# Patient Record
Sex: Male | Born: 1935 | Race: White | Hispanic: No | Marital: Married | Smoking: Former smoker
Health system: Southern US, Community
[De-identification: ages and names within clinical notes are randomized; demographics above are authoritative.]

## PROBLEM LIST (undated history)

## (undated) DIAGNOSIS — I4891 Unspecified atrial fibrillation: Secondary | ICD-10-CM

## (undated) DIAGNOSIS — I10 Essential (primary) hypertension: Secondary | ICD-10-CM

## (undated) DIAGNOSIS — E079 Disorder of thyroid, unspecified: Secondary | ICD-10-CM

## (undated) DIAGNOSIS — N400 Enlarged prostate without lower urinary tract symptoms: Secondary | ICD-10-CM

## (undated) DIAGNOSIS — F0390 Unspecified dementia without behavioral disturbance: Secondary | ICD-10-CM

## (undated) DIAGNOSIS — M48061 Spinal stenosis, lumbar region without neurogenic claudication: Secondary | ICD-10-CM

## (undated) HISTORY — PX: EYE SURGERY: SHX253

## (undated) HISTORY — PX: OTHER SURGICAL HISTORY: SHX169

## (undated) HISTORY — DX: Spinal stenosis, lumbar region without neurogenic claudication: M48.061

---

## 2001-03-02 ENCOUNTER — Ambulatory Visit (HOSPITAL_COMMUNITY): Admission: RE | Admit: 2001-03-02 | Discharge: 2001-03-02 | Payer: Self-pay | Admitting: Cardiology

## 2001-03-14 ENCOUNTER — Ambulatory Visit (HOSPITAL_COMMUNITY): Admission: RE | Admit: 2001-03-14 | Discharge: 2001-03-14 | Payer: Self-pay | Admitting: Cardiology

## 2001-03-27 ENCOUNTER — Ambulatory Visit (HOSPITAL_COMMUNITY): Admission: RE | Admit: 2001-03-27 | Discharge: 2001-03-27 | Payer: Self-pay | Admitting: Cardiology

## 2001-05-04 ENCOUNTER — Ambulatory Visit (HOSPITAL_COMMUNITY): Admission: RE | Admit: 2001-05-04 | Discharge: 2001-05-04 | Payer: Self-pay | Admitting: *Deleted

## 2001-05-04 ENCOUNTER — Encounter: Payer: Self-pay | Admitting: *Deleted

## 2001-05-18 ENCOUNTER — Ambulatory Visit (HOSPITAL_COMMUNITY): Admission: RE | Admit: 2001-05-18 | Discharge: 2001-05-18 | Payer: Self-pay | Admitting: Cardiology

## 2001-05-18 ENCOUNTER — Encounter: Payer: Self-pay | Admitting: Cardiology

## 2001-06-04 ENCOUNTER — Encounter: Payer: Self-pay | Admitting: *Deleted

## 2001-06-06 ENCOUNTER — Inpatient Hospital Stay (HOSPITAL_COMMUNITY): Admission: RE | Admit: 2001-06-06 | Discharge: 2001-06-07 | Payer: Self-pay | Admitting: *Deleted

## 2001-06-06 ENCOUNTER — Encounter (INDEPENDENT_AMBULATORY_CARE_PROVIDER_SITE_OTHER): Payer: Self-pay | Admitting: Specialist

## 2001-07-20 ENCOUNTER — Ambulatory Visit (HOSPITAL_COMMUNITY): Admission: RE | Admit: 2001-07-20 | Discharge: 2001-07-20 | Payer: Self-pay | Admitting: Cardiology

## 2001-12-24 ENCOUNTER — Encounter: Payer: Self-pay | Admitting: General Surgery

## 2001-12-26 ENCOUNTER — Encounter (INDEPENDENT_AMBULATORY_CARE_PROVIDER_SITE_OTHER): Payer: Self-pay | Admitting: Specialist

## 2001-12-26 ENCOUNTER — Ambulatory Visit (HOSPITAL_COMMUNITY): Admission: RE | Admit: 2001-12-26 | Discharge: 2001-12-27 | Payer: Self-pay | Admitting: General Surgery

## 2001-12-28 ENCOUNTER — Emergency Department (HOSPITAL_COMMUNITY): Admission: EM | Admit: 2001-12-28 | Discharge: 2001-12-28 | Payer: Self-pay | Admitting: Emergency Medicine

## 2001-12-29 ENCOUNTER — Encounter: Payer: Self-pay | Admitting: Emergency Medicine

## 2002-03-04 ENCOUNTER — Inpatient Hospital Stay (HOSPITAL_COMMUNITY): Admission: AD | Admit: 2002-03-04 | Discharge: 2002-03-06 | Payer: Self-pay | Admitting: Cardiology

## 2004-09-01 ENCOUNTER — Emergency Department (HOSPITAL_COMMUNITY): Admission: EM | Admit: 2004-09-01 | Discharge: 2004-09-01 | Payer: Self-pay | Admitting: Emergency Medicine

## 2006-01-30 ENCOUNTER — Ambulatory Visit (HOSPITAL_COMMUNITY): Admission: RE | Admit: 2006-01-30 | Discharge: 2006-01-30 | Payer: Self-pay | Admitting: Ophthalmology

## 2006-07-21 ENCOUNTER — Ambulatory Visit: Payer: Self-pay | Admitting: Vascular Surgery

## 2008-04-15 ENCOUNTER — Encounter: Admission: RE | Admit: 2008-04-15 | Discharge: 2008-04-15 | Payer: Self-pay | Admitting: Internal Medicine

## 2008-05-26 ENCOUNTER — Encounter: Admission: RE | Admit: 2008-05-26 | Discharge: 2008-05-26 | Payer: Self-pay | Admitting: Internal Medicine

## 2008-08-26 ENCOUNTER — Ambulatory Visit: Payer: Self-pay | Admitting: Diagnostic Radiology

## 2008-08-26 ENCOUNTER — Emergency Department (HOSPITAL_BASED_OUTPATIENT_CLINIC_OR_DEPARTMENT_OTHER): Admission: EM | Admit: 2008-08-26 | Discharge: 2008-08-26 | Payer: Self-pay | Admitting: Emergency Medicine

## 2008-08-30 ENCOUNTER — Ambulatory Visit: Payer: Self-pay | Admitting: Occupational Medicine

## 2008-08-30 DIAGNOSIS — L600 Ingrowing nail: Secondary | ICD-10-CM | POA: Insufficient documentation

## 2009-09-08 ENCOUNTER — Ambulatory Visit: Payer: Self-pay | Admitting: Family Medicine

## 2009-09-08 DIAGNOSIS — R040 Epistaxis: Secondary | ICD-10-CM

## 2010-01-28 ENCOUNTER — Encounter: Admission: RE | Admit: 2010-01-28 | Discharge: 2010-01-28 | Payer: Self-pay | Admitting: Internal Medicine

## 2010-07-15 ENCOUNTER — Encounter
Admission: RE | Admit: 2010-07-15 | Discharge: 2010-07-15 | Payer: Self-pay | Source: Home / Self Care | Attending: Internal Medicine | Admitting: Internal Medicine
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
## 2010-09-30 LAB — DIFFERENTIAL
Basophils Absolute: 0 10*3/uL (ref 0.0–0.1)
Basophils Relative: 1 % (ref 0–1)
Eosinophils Relative: 1 % (ref 0–5)
Lymphocytes Relative: 36 % (ref 12–46)
Lymphs Abs: 2 10*3/uL (ref 0.7–4.0)

## 2010-09-30 LAB — BASIC METABOLIC PANEL
BUN: 25 mg/dL — ABNORMAL HIGH (ref 6–23)
CO2: 30 mEq/L (ref 19–32)
Calcium: 9.4 mg/dL (ref 8.4–10.5)
Creatinine, Ser: 1 mg/dL (ref 0.4–1.5)
GFR calc Af Amer: >60 mL/min (ref >60)
Glucose, Bld: 84 mg/dL (ref 70–99)
Sodium: 140 mEq/L (ref 135–145)

## 2010-09-30 LAB — CBC
HCT: 36.8 % — ABNORMAL LOW (ref 39.0–52.0)
MCHC: 34.7 g/dL (ref 30.0–36.0)
RBC: 3.69 MIL/uL — ABNORMAL LOW (ref 4.22–5.81)
RDW: 13.1 % (ref 11.5–15.5)

## 2010-09-30 LAB — PROTIME-INR: Prothrombin Time: 21.7 seconds — ABNORMAL HIGH (ref 11.6–15.2)

## 2010-11-05 NOTE — Op Note (Signed)
NAMEGILLES, [name]            ACCOUNT NO.:  [id number]   MEDICAL RECORD NO.:  [id number]          PATIENT TYPE:  AMB   LOCATION:  SDS                          FACILITY:  [facility]   PHYSICIAN:  Alford Highland. Rankin, M.D.   DATE OF BIRTH:  1919-08-30   DATE OF PROCEDURE:  DATE OF DISCHARGE:                                 OPERATIVE REPORT   PREOPERATIVE DIAGNOSIS:  Epiretinal membrane-right eye.   POSTOPERATIVE DIAGNOSES:  1. Epiretinal membrane-right eye.  2. Retinal hole-right eye.   PROCEDURE:  1. Posterior vitrectomy membrane-peel-epiretinal membrane-internal      limiting membrane as well-25 gauge.  2. Focal endolaser photocoagulation through retinal holes, retinopexy      fashion.   SURGEON:  Alford Highland. Rankin, M.D.   ANESTHESIA:  Local retrobulbar anesthesia control.   INDICATIONS FOR PROCEDURE:  A 77 year old man who has significant visual  acuity improvement in the right eye at the bases of epiretinal membrane  found to have topographic distortion and severe symptoms.  The patient  understands the risk of anesthesia including complications of loss to the  eye including but not limited to, hemorrhage, infection, scarring, need for  further surgery, no change in vision, loss in vision, progressive disease  despite intervention.  Appropriate signed consent was obtained and the  patient taken to the operating room.   In the operating room, appropriate monitoring was followed by mild sedation.  Marcaine 0.5% was delivered 5 mL retrobulbar followed by an additional 5 mL  laterally in the fashion of modified Darel Hong.  The left periocular region  was sterilely prepped and draped in the usual ophthalmic fashion.  Lid  speculum was applied.  A 25-gauge infusion system was placed with trocar  inferotemporal.  A superior trocar was applied.  The core vitrectomy was  then begun.  The skirt was circumcised.  A 25-gauge forceps was then used to  engage the epiretinal membrane.  This was  moved just continuously.  A  separate internal membrane sheath had to be remove overlying the bones of  the macular and perimacular region.  Typical change in the macular region  were identified for removing of the internal limiting membrane.  At this  time, peripheral retina is inspected and there were 4 retinal scars  inferiorly from the 5:30 to 6 o'clock position.  These are treated with  laser photocoagulation in a retinopexy fashion.  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  Laser photocoagulation  placed around this region for retinopexy purposes.   At this time, the  trocars  ____ were removed from the eye.  The superior  trocar is removed under the high fusion pressures and the incisions were  self-sealing.  The infusion was removed.  Subconjunctival injection of  Decadron applied.  Sterile patch and Fox shield applied . The patient  tolerated the procedure without complication.      Alford Highland Rankin, M.D.  Electronically Signed     GAR/MEDQ  D:  01/30/2006  T:  01/31/2006  Job:  [id number]

## 2010-11-05 NOTE — Op Note (Signed)
[place]. [facility]  Patient:    [name], [name] Visit Number: [id number] MRN: [id number]          Service Type: CAT Location: [facility] [id number] Attending Physician:  Corliss Marcus Dictated by:   Francisca December, M.D. Proc. Date: 03/14/01 Admit Date:  03/14/2001   CC:         Tyson Dense, M.D.   Operative Report  PROCEDURE PERFORMED:  Elective cardioversion.  INDICATIONS:  [name] is a 77 year old man with recurrent atrial fibrillation.  He is 12 days status post unsuccessful elective cardioversion. He initially converted to sinus rhythm but then within one hour reverted to atrial fibrillation.  He has since undergone amiodarone loading at 400 mg p.o. t.i.d. x one week and now is taking 400 mg daily.  He has returned to the outpatient center for a second attempt at conversion and then maintenance of sinus rhythm.  ANESTHESIA:  Dr. Gypsy Balsam administered 200 mg IV sodium pentothal.  DESCRIPTION OF PROCEDURE:  After adequate anesthesia was documented and with anesthesia in attendance while monitoring heart rate, blood pressure, O2 saturation, and ECG, the patient underwent a single dose of transthoracic energy 200 joules synchronized biphasic.  There was a proper return of sinus rhythm with intermittent PACs.  Of note, the patients INR was 2.73 on March 12, 2001.  IMPRESSION:  Successful elective cardioversion to sinus rhythm.  PLAN: 1. The patient will remain n.p.o. x one hour.  If atrial fibrillation recurs,    will give IV loaded amiodarone 150 mg and repeat. 2. Otherwise will continue amiodarone p.o. 400 mg q day x two more weeks, then    decrease to 200 mg p.o. q day. 3. Continue warfarin x three more weeks. 4. Routine follow-up in the office within two weeks. Dictated by:   Francisca December, M.D. Attending Physician:  Corliss Marcus DD:  03/14/01 TD:  03/14/01 Job: [id number]

## 2010-11-05 NOTE — Op Note (Signed)
[place]. [facility]  Patient:    [name], [name] Visit Number: [id number] MRN: [id number]          Service Type: DSU Location: [id number] Attending Physician:  Brandy Hale Dictated by:   Angelia Mould. Derrell Lolling, M.D. Proc. Date: 12/26/01 Admit Date:  12/26/2001 Discharge Date: 12/27/2001   CC:         [name], M.D.   Operative Report  PREOPERATIVE DIAGNOSIS:  Incarcerated ventral hernia.  POSTOPERATIVE DIAGNOSIS:  Incarcerated ventral hernia.  OPERATION PERFORMED: 1. Repair of incarcerated ventral hernia with dual Gore-Tex mesh. 2. Partial omentectomy.  SURGEON:  Angelia Mould. Derrell Lolling, M.D.  ANESTHESIA:  INDICATIONS FOR PROCEDURE:  The patient is a 77 year old white male in reasonably good health.  He has a several year history of intermittent upper abdominal midline pain and a lump.  This has been getting worse over time.  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  On exam, he is a relatively thin man.  There was a 4 to 5 cm soft tissue mass in the upper midline about 4 cm above the umbilicus.  This was a little bit tender, but no signs of infection and I cannot reduce this. No other hernias were noted in the abdominal wall.  Because he is symptomatic he was brought to the operating room for exploration and repair.  DESCRIPTION OF PROCEDURE:  Following the induction of general endotracheal anesthesia, the patients abdomen was prepped and draped in a sterile fashion. A midline incision was made in the abdomen about midway between the xiphoid and the umbilicus overlying the palpable mass.  Dissection was carried down through the subcutaneous tissues and I found a large fatty hernia sac in the subcutaneous tissue at least 5 or 6 cm in diameter.  This was dissected all the way down to the fascia.  There was  a defect in the fascia about 3 cm in size.  This had to be opened up about 1 cm above and about 1 cm below to completely mobilize the fatty tissue.  This looked like a chronic incarceration and I felt that it would be best to resect this fatty tissue which was probably a combination of omentum and preperitoneal fat.  We very carefully dissected out the fatty tissue at the level of the anterior abdominal wall fascia, clamped small pieces of the fat and divided it.  About 10 such hemostats were placed.  We were very careful to dissect the tissue out to be sure that there was no intestinal content close by.  The resected omentum and preperitoneal fat were sent for pathologic examination.  We tied off the omental bleeders with 3-0 Vicryl ties.  As mentioned, we opened up the fascia until it was close to 5 cm sagittally and about 3 to 3.5 cm transversely.  With blunt dissection, we pushed the preperitoneal fat away.  We examined and palpated the abdominal wall from within, both superiorly as far as possible and inferiorly as far as possible. We found that around the hernia defect, the fascia was thinned out but there was no other discreet separate defect.  We brought a 10 cm x 15 cm piece of Dual Gore-Tex mesh to the operative field and cut this down to size a little bit.  This was sutured in place beneath the fascia with interrupted  sutures of 0 Novofil.  We were very careful to place the smooth surface toward the abdominal cavity and the rough surface toward the abdominal wall fascia.  About eight such sutures of 0 Novofil were placed down through the fascia and then with a mattress suture through the mesh and then back up through the fascia.  After all the sutures were placed we then reduced the mesh into the preperitoneal space and found that when we pulled up on the sutures, the mesh spread out quite nicely and quite smoothly and covered the defect widely.  The sutures were about 3 cm  back from the hernia defect edge in all directions and so we felt good about the coverage of the defect.  We tied all of these Novofil sutures.  We then were able to close the fascia in the midline with about three interrupted sutures of 0 Novofil.  We irrigated the wound with saline and antibiotic irrigating solution.  The subcutaneous tissue was closed with interrupted sutures of 2-0 Vicryl.  The skin was closed with skin staples.  Clean bandages were placed and the patient was taken to recovery room in stable condition.  Estimated blood loss was about 30 to 50 cc.  Complications were none.  Sponge, needle and instrument counts were correct. Dictated by:   Angelia Mould. Derrell Lolling, M.D. Attending Physician:  Brandy Hale DD:  12/26/01 TD:  12/27/01 Job: [id number]

## 2010-11-05 NOTE — H&P (Signed)
[name], [name]                      ACCOUNT NO.:  [id number]   MEDICAL RECORD NO.:  [id number]                   PATIENT TYPE:  INP   LOCATION:  2041                                 FACILITY:  [facility]   PHYSICIAN:  Francisca December, MD                 DATE OF BIRTH:  October 03, 1919   DATE OF ADMISSION:  03/04/2002  DATE OF DISCHARGE:  03/06/2002                                HISTORY & PHYSICAL   PRIMARY CARE Erynne Kealey:  Georgann Housekeeper, M.D.   IMPRESSION:  (As per Dr. Corliss Marcus.)  1. Recurrent atrial fibrillation; initial onset atrial fibrillation first     noted in 1996 with subsequent cardioversion.  Recurrent atrial     fibrillation with cardioversion March 02, 2001, at which time the     patient was initiated on amiodarone load.  Recurrent atrial fibrillation     shortly thereafter, for which he was again successfully cardioverted     March 14, 2001 with increasing of amiodarone.  Followup     electrocardiogram earlier in August revealed recurrent atrial     fibrillation.  His amiodarone was discontinued and a recent level was     0.5.  He now is electively admitted for initiation of Betapace therapy     with subsequent direct current cardioversion.  A 2-dimensional     echocardiogram on September 2002 revealed normal left ventricular     systolic function with ejection fraction of 65%.  Moderate mitral     regurgitation.  2. Benign prostatic hypertrophy for which he is on Proscar and Flomax.  3. History of hypertension on HCTZ and Avapro.  4. Hyperlipidemia.  5. Atherosclerotic peripheral vascular disease; status post left carotid     endarterectomy by Dr. Madilyn Fireman December 2002.  6. History of normal Cardiolite September 2002,  nongated as he was in     atrial fibrillation.  7. Systemic anticoagulation on Coumadin secondary to atrial fibrillation.   PLAN:  (As per Dr. Corliss Marcus.)  Patient is electively admitted for initiation of Betapace therapy.  Anticipate  follow up direct current cardioversion March 06, 2002, if he  tolerates initiation of Betapace without excessive bradycardia.   HISTORY OF PRESENT ILLNESS:  The patient is a very pleasant 77 year old  gentleman we continue to follow for his atrial fibrillation; found on  routine EKG August of this year to have recurrent atrial fibrillation on  amiodarone therapy.  He was initiated on amiodarone approximately one year  ago with subsequent cardioversion.  The patient mentioned that he had noted  some increasing shortness of breath but denied palpitations, light  headedness, syncope or near syncopal episodes.  He feels he may have been  feeling those symptoms approximately since July.  He denied pressure or  heaviness in his chest.  No neck, back or arm discomfort with or without  exertion.  He overall is very active, working on his sail boat, dancing,  and  is painting.  Amiodarone was discontinued on February 28, 2002.  Amiodarone  level checked on September 11 was returned at 0.5 mg/l (way below  therapeutic level).   PAST MEDICAL HISTORY:  As above.   ALLERGIES:  No known drug allergies.   MEDICATIONS:  1. Proscar 5 mg one p.o. q.d.  2. Flomax 0.4 mg p.o. q.h.s.  3. Avapro 300 mg p.o. q.d.  4. Aspirin 81 mg every day.  5. Hydrochlorothiazide 25 mg every day.  6. Warfarin 2.5 mg (one-half of a 5 mg tab) on Monday, Wednesday and Friday,     otherwise 5 mg tab in the evening.   SOCIAL HISTORY:  Habits:  Tobacco:  Quit in 1950.  ETOH:  None.  Patient is  married for many years.  He is originally from [place], now in  [place] for greater than 15 years.  He has many hobbies including  painting and ballroom dancing.   FAMILY HISTORY:  Noncontributory.   REVIEW OF SYMPTOMS:  As in HPI/past medical history, otherwise reviewed  without problems.   PHYSICAL EXAMINATION:  (As performed by Dr. Corliss Marcus.)  VITAL SIGNS:  Blood pressure 134/86, heart rate 68 and  irregular,  respiratory rate 16, temperature 97.0.  Patient is 5 feet 10 inches and  weighs 180.5 pounds.  GENERAL:  He is a slender, well-nourished, vigorous-appearing 77 year old in  no apparent distress.  His wife is in attendance.  HEENT/NECK:  Brisk bilateral carotid upstroke without bruit.  No jugular  venous distention.  CHEST:  Clear with equal bilateral excursion.  Negative costovertebral angle  tenderness.  CARDIOVASCULAR:  Slightly irregular rhythm, normal S1, S2.  Ejection  systolic murmur is present along the left sternal border.  No diastolic  murmur.  PMI is nondisplaced.  ABDOMEN:  Soft, flat, nondistended. Normal active bowel sounds.  Negative  for organomegaly.  No bruits.  EXTREMITIES:  Distal pulses intact.  Negative pedal edema.   LABORATORY TESTS AND DATA:  EKG reveals atrial fibrillation, controlled  ventricular response and normal QTc.   From February 28, 2002, glucose was 80, BUN 22, creatinine 1.0, sodium 138,  K 5.2, chloride 101, cO2 29, calcium 9.6.  Hemoglobin 13.1, hematocrit 37.6,  WBC of 5.0, platelet count 210,000. ProTime is 15.9, INR of 1.91, PTT 41.  Amiodarone level from February 28, 2002 was 0.5 (reference range 1.0 to 2.5  mg/l).     Salomon Fick, NP                         Francisca December, MD    MES/MEDQ  D:  03/06/2002  T:  03/07/2002  Job:  [id number]

## 2010-11-05 NOTE — Discharge Summary (Signed)
[place]. [facility]  Patient:    [name], [name] Visit Number: [id number] MRN: [id number]          Service Type: SUR Location: [id number] Attending Physician:  Melvenia Needles Dictated by:   Dominica Severin, P.A. Admit Date:  06/06/2001 Discharge Date: 06/07/2001   CC:         Tyson Dense, M.D.  Francisca December, M.D.  [facility]   Discharge Summary  ADMISSION DIAGNOSIS:  Left carotid stenosis.  SECONDARY DIAGNOSES/PAST MEDICAL HISTORY: 1.  Status post cardioversion for supraventricular tachycardic arrhythmia. 2.  Hypertension. 3.  Benign prostatic hypertrophy.  NEW DIAGNOSIS/DISCHARGE DIAGNOSIS:  Status post left carotid endarterectomy with primary closure.  PROCEDURE DESCRIPTION:  Left carotid endarterectomy with primary closure done June 06, 2001.  HOSPITAL COURSE:  This patient is a 77 year old Caucasian male who was referred by Dr. Amil Amen after carotid bruit was detected on routine examination.  Doppler study done at [facility] showed critical left carotid stenosis.  Evaluation was done on April 30, 2001 by Dr. Madilyn Fireman.  Patient was recommended to undergo left carotid endarterectomy.  The patient agreed.  He underwent surgery as stated above on June 06, 2001.  The patient tolerated the procedure well.  His postoperative course was essentially uneventful.  He denies any cardiac, respiratory or gastrointestinal complications.  He was tolerating his diet without difficulty.  Wounds remain with primary closure and are clean and dry without signs of infection or complications.  He was ambulating without difficulty.  He is deemed for discharge later on today. His neurological status is stable without deficits.  He is alert and oriented x three.  DISCHARGE CONDITION:  Stable.  DISCHARGE MEDICATIONS: 1.  Tylox one to two tablets q.4-6h. p.r.n. pain. 2.  Proscar 5 mg at bedtime. 3.  Flomax 0.4 mg at bedtime. 4.  Zestril 5 mg q.d. 5.   Amiodarone 200 mg q.d. 6.  Amoxicillin 500 mg p.r.n. 7.  Plavix 75 mg q.d.  DISCHARGE INSTRUCTIONS:  The patient is instructed not to do any driving, heavy lifting or strenuous activity.  He will follow heart healthy low-fat, low-salt diet.  He may shower on Saturday.  He can call the office if his incisions show increased redness, swelling or drainage or if he has a fever over 101 degrees.  DISCHARGE FOLLOW UP:  He has follow up appointment scheduled with Dr. Madilyn Fireman on Monday, July 02, 2000 at 9:40 a.m.  He will follow up with Dr. Amil Amen and Dr. Eula Listen as directed. Dictated by:   Dominica Severin, P.A. Attending Physician:  Melvenia Needles DD:  06/07/01 TD:  06/08/01 Job: [id number]

## 2010-11-05 NOTE — Op Note (Signed)
[place]. [facility]  Patient:    [name], [name] Visit Number: [id number] MRN: [id number]          Service Type: SUR Location: [id number] Attending Physician:  Melvenia Needles Dictated by:   Denman George, M.D. Proc. Date: 06/06/01 Admit Date:  06/06/2001   CC:         Francisca December, M.D.   Operative Report  PREOPERATIVE DIAGNOSIS:  Severe left internal carotid artery stenosis.  POSTOPERATIVE DIAGNOSIS:  Severe left internal carotid artery stenosis.  OPERATION PERFORMED:  Left carotid endarterectomy.  SURGEON:  Denman George, M.D.  ASSISTANT: 1. Larina Earthly, M.D. 2. Maxwell Marion, RNFA  ANESTHESIA:  General endotracheal.  ANESTHESIOLOGIST:  Guadalupe Maple, M.D.  INDICATIONS FOR PROCEDURE:  The patient is a 77 year old male referred for evaluation of severe left internal carotid artery stenosis.  The patient initially noted a noise in his left ear and Doppler evaluation revealed severe left internal carotid artery stenosis.  An MR angiogram was obtained which verified adequate distal flow.  He is brought to the operating room at this time for left carotid endarterectomy.  He understands the risks and benefits of this procedure including the potential risks of MI, CVA and death.  Major risk rate approximately 1 to 2%.  DESCRIPTION OF PROCEDURE:  The patient was brought to the operating room in stable condition.  Informed consent obtained.  General endotracheal anesthesia induced in the supine position.  Foley catheter and arterial line were placed. Left neck prepped and draped in sterile fashion.  Curvilinear skin incision made along the anterior border of the left sternomastoid muscle.  Subcutaneous tissue and platysma divided with electrocautery.  Deep dissection carried down along the anterior border of the sternomastoid to the carotid sheath.  The facial vein was ligated with 2-0 silk and divided.  The hypoglossal  nerve was clearly identified and reflected superiorly.  The vagus nerve also clearly identified posteriorly and preserved.  The carotid bifurcation was quite low in the neck.  The incision had to be extended proximally.  The common carotid artery mobilized proximally and encircled with a vessel loop.  The bifurcation exposed and the origin of the superior thyroid and external carotid artery were encircled with vessel loops. Theinternal carotid artery was followed distally up to the posterior belly of the digastric muscle, encircled with a vessel loop distally where it was free of significant disease.  The carotid bifurcation revealed extensive plaque with along segment of plaque extending into the internal carotid artery for 2 to 3 cm.  The patient administered 7000 units of heparin intravenously.  Adequate circulation time permitted.  Carotid vessels controlled with clamps.  A longitudinal arteriotomy made in the distal common carotid artery.  The arteriotomy extended across the carotid bulb and up into the internal carotid artery.  There was an extensive ulcerated plaque at the origin of the internal carotid artery with a high grade greater than 90% stenosis.  A shunt was inserted.  The endarterectomy elevator was then used to remove the plaque.  The plaque divided proximally in the common carotid artery with Potts scissors. The external carotid artery and the superior thyroid were endarterectomized using the eversion technique.  The plaque then feathered well out into the external carotid artery.  Fragments of plaque were removed with plaque forceps.  Site irrigated with heparin saline solution.  The internal carotid artery was of good caliber and therefore a primary closure was carried out  using running 6-0 Prolene suture.  The shunt was then removed.  All vessels well flushed.  Clamps removed directing the initial antegrade flow up the external carotid artery, and following  this, the internal carotid artery was released.  Excellent pulse and Doppler signal in the distal internal carotid artery.  The patient was administered 50 mg of protamine intravenously.  Adequate hemostasis obtained.  Sponge and instrument counts were correct.  The sternomastoid fascia was then closed with a running 2-0 Vicryl suture.  The platysma closed with running 3-0 Vicryl suture. Skin closed with 4-0 Monocryl. Half inch Steri-Strips applied.  Anesthesia reversed in the operating room.  Patient awakened readily.  Moved all extremities to command.  Transferred to recovery room in stable condition. Dictated by:   Denman George, M.D. Attending Physician:  Melvenia Needles DD:  06/06/01 TD:  06/07/01 Job: [id number]

## 2010-11-05 NOTE — Op Note (Signed)
[place] [address]  Patient:    [name], [name]. Visit Number: [id number] MRN: [id number]          Service Type: Attending:  Francisca December, M.D. Dictated by:   Francisca December, M.D. Proc. Date: 03/02/01   CC:         Tyson Dense, M.D.   Operative Report  ELECTIVE CARDIOVERSION  PROCEDURE PERFORMED:  Elective cardioversion.  INDICATIONS FOR PROCEDURE:  Mr. Jaivian Battaglini is a 77 year old man with a history of atrial fibrillation, status post cardioversion in 1996.  He had a recent relapse with mild symptoms of dyspnea and fatigue.  He is a very vigorous man, exercises regularly, engages in ballroom dancing.  He has been anticoagulated for the past 3 to 4 weeks.  Latest INR today is 2.0.  He is to undergo elective cardioversion at this time.  ANESTHESIA:  Cliffton Asters. Crews, M.D., 200 mg of IV penathol.  DESCRIPTION OF PROCEDURE:  After IV administration of penathol and with anesthesia in attendance, while monitoring heart rate, blood pressure, O2 saturation, and ECG, the patient underwent a single transthoracic dose of DC energy 200 joules synchronized, biphasic.  This resulted in prompt return of sinus rhythm with frequent premature atrial contractions with competing pacemaker.  The patient was administered 10 mg of IV metoprolol.  He is awake at this time and shows no evidence of any complication.  IMPRESSION:  Successful electrocardioversion to sinus rhythm with frequent premature atrial contractions and competing ectopic atrial rhythm.  PLAN: 1. Continue Warfarin x 3 more weeks. 2. Amiodarone 400 mg p.o. q.d., then 400 mg p.o. b.i.d. x 2 weeks, then 400 mg    p.o. q.d., prescription written, 90 tablets, 200 mg, 10 refills. 3. Office visit in 2 to 3 weeks. 4. He is scheduled for an echocardiogram on March 13, 2001. 5. We will also plan on an exercise treadmill test in the near future. Dictated by:   Francisca December,  M.D. Attending:  Francisca December, M.D. DD:  03/02/01 TD:  03/02/01 Job: [id number]

## 2010-11-23 ENCOUNTER — Other Ambulatory Visit (HOSPITAL_COMMUNITY): Payer: Self-pay | Admitting: Neurology

## 2010-11-23 DIAGNOSIS — R413 Other amnesia: Secondary | ICD-10-CM

## 2010-11-26 ENCOUNTER — Encounter (HOSPITAL_COMMUNITY): Payer: Self-pay

## 2010-11-26 ENCOUNTER — Encounter (HOSPITAL_COMMUNITY)
Admission: RE | Admit: 2010-11-26 | Discharge: 2010-11-26 | Disposition: A | Discharge status: Home / Self Care | Payer: No Typology Code available for payment source | Source: Ambulatory Visit | Attending: Neurology | Admitting: Neurology

## 2010-11-26 DIAGNOSIS — R413 Other amnesia: Secondary | ICD-10-CM | POA: Insufficient documentation

## 2011-02-02 ENCOUNTER — Other Ambulatory Visit: Payer: Self-pay | Admitting: Dermatology

## 2011-04-12 ENCOUNTER — Other Ambulatory Visit: Payer: Self-pay | Admitting: Internal Medicine

## 2011-04-12 DIAGNOSIS — I714 Abdominal aortic aneurysm, without rupture: Secondary | ICD-10-CM

## 2011-04-18 ENCOUNTER — Ambulatory Visit
Admission: RE | Admit: 2011-04-18 | Discharge: 2011-04-18 | Disposition: A | Discharge status: Home / Self Care | Payer: Medicare Other | Source: Ambulatory Visit | Attending: Internal Medicine | Admitting: Internal Medicine

## 2011-04-18 DIAGNOSIS — I714 Abdominal aortic aneurysm, without rupture: Secondary | ICD-10-CM

## 2011-04-19 ENCOUNTER — Other Ambulatory Visit: Payer: No Typology Code available for payment source

## 2011-04-21 ENCOUNTER — Other Ambulatory Visit: Payer: No Typology Code available for payment source

## 2012-01-22 IMAGING — US US AORTA
1 series · 14 of 25 positions shown · non-contrast
Comparison: CT A/P on 05/26/2008.

CLINICAL DATA: Abdominal aortic aneurysm.

ULTRASOUND OF ABDOMINAL AORTA
TECHNIQUE: Ultrasound examination of the abdominal aorta was
performed to evaluate for abdominal aortic aneurysm.

[Series 1: us aorta · 0.35mm/px · 14 of 28 slices shown]
[im 1/28]
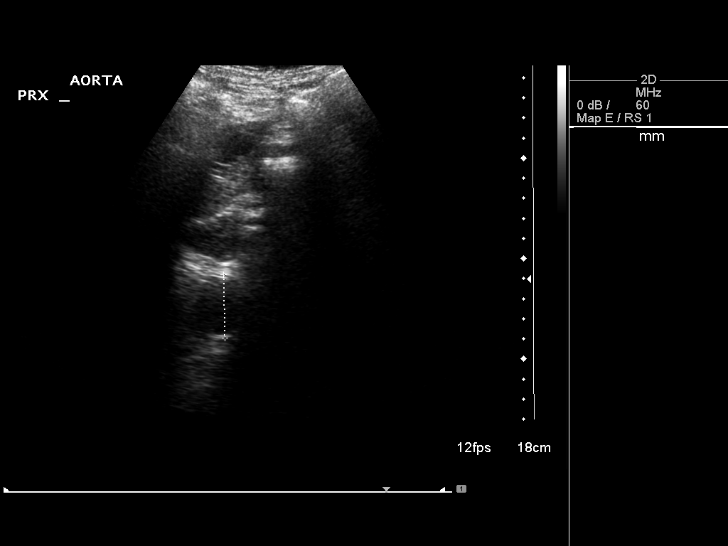
[im 3/28]
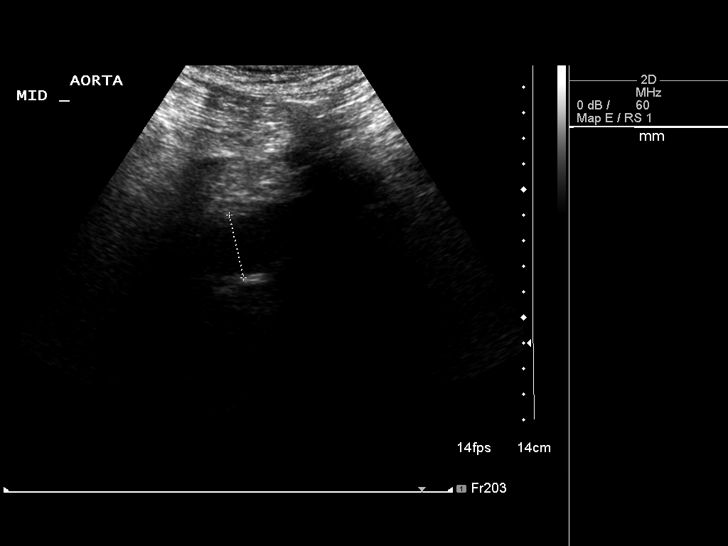
[im 5/28]
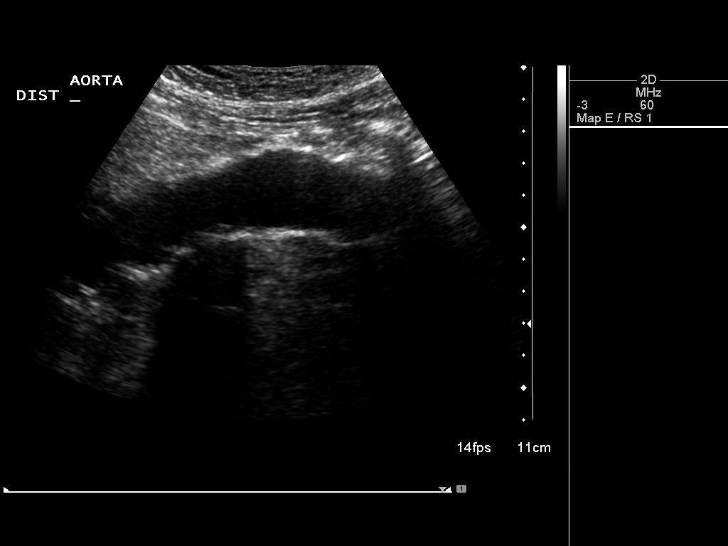
[im 7/28]
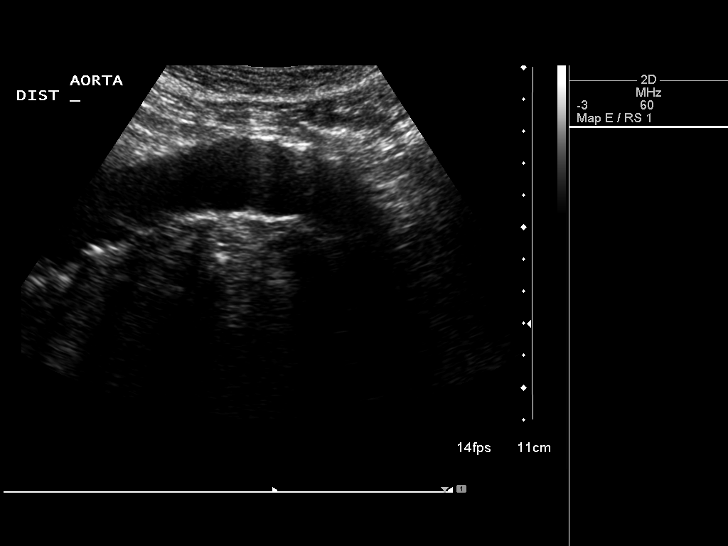
[im 10/28]
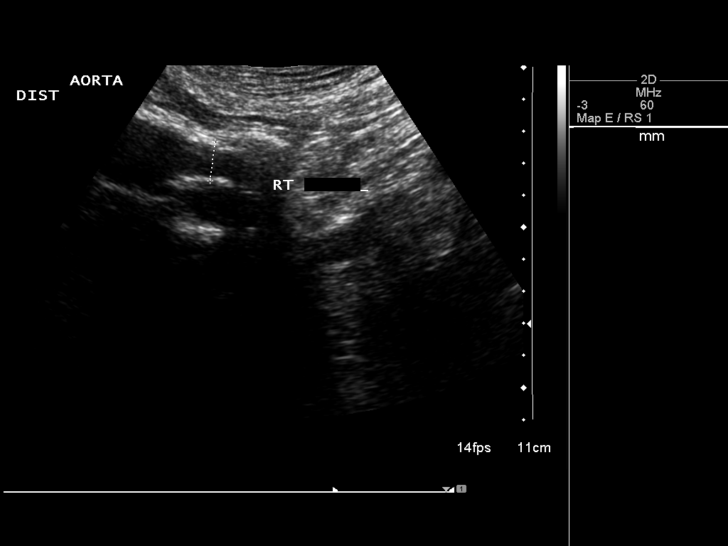
[im 11/28]
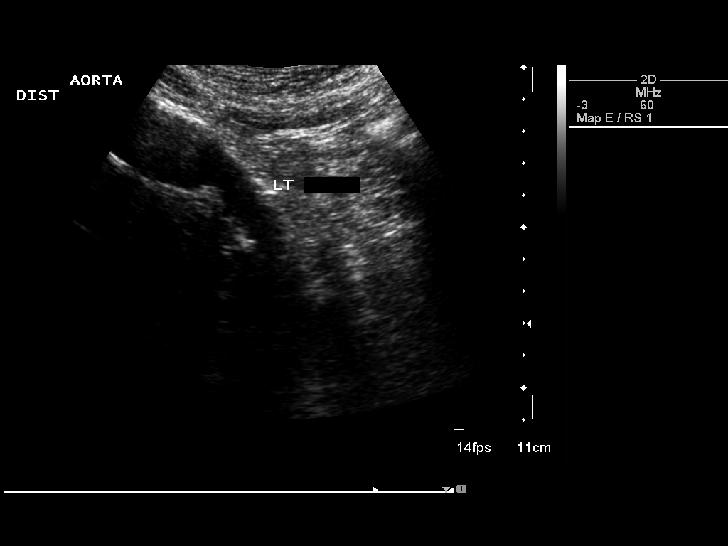
[im 13/28]
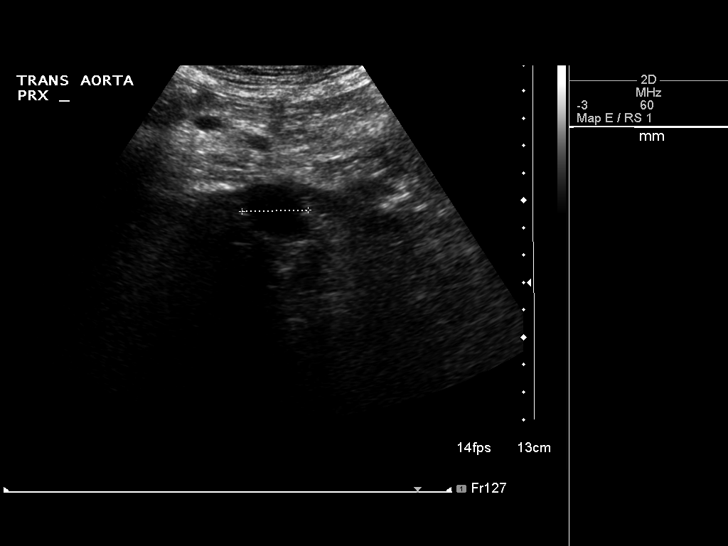
[im 15/28]
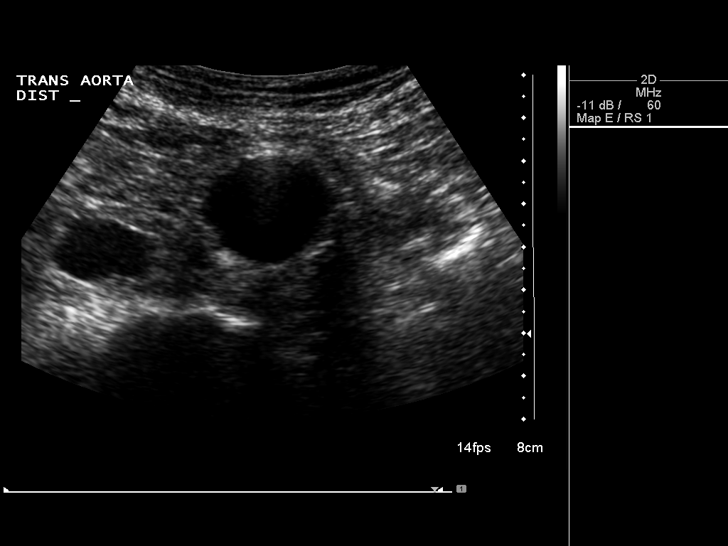
[im 17/28]
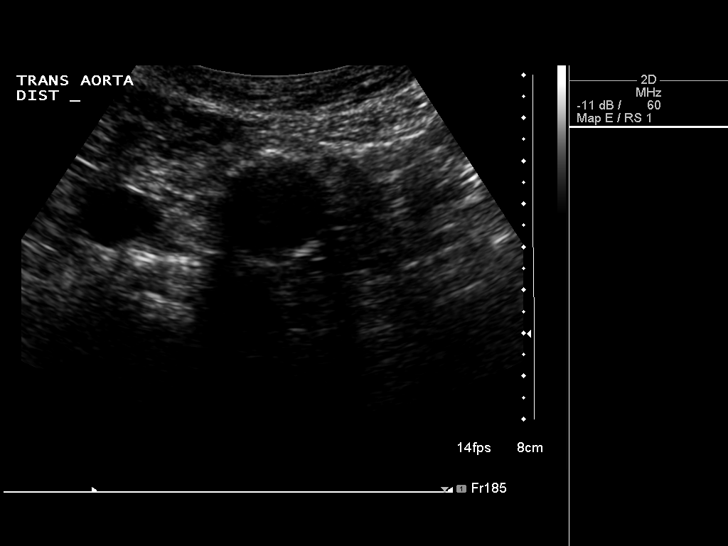
[im 19/28]
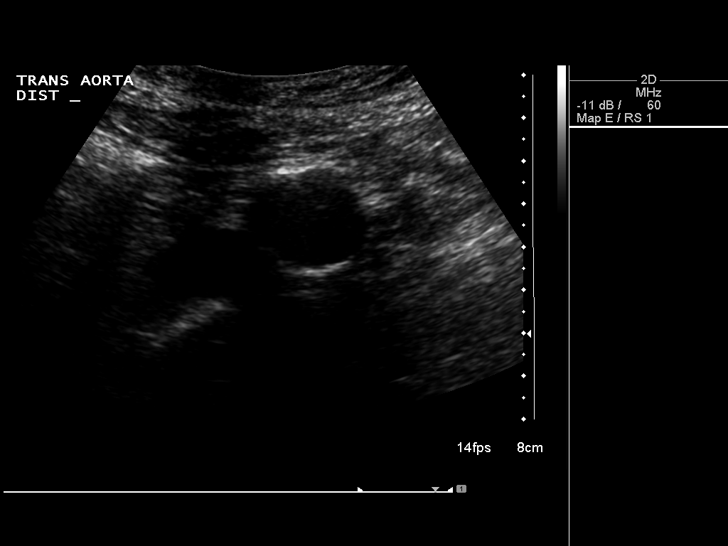
[im 21/28]
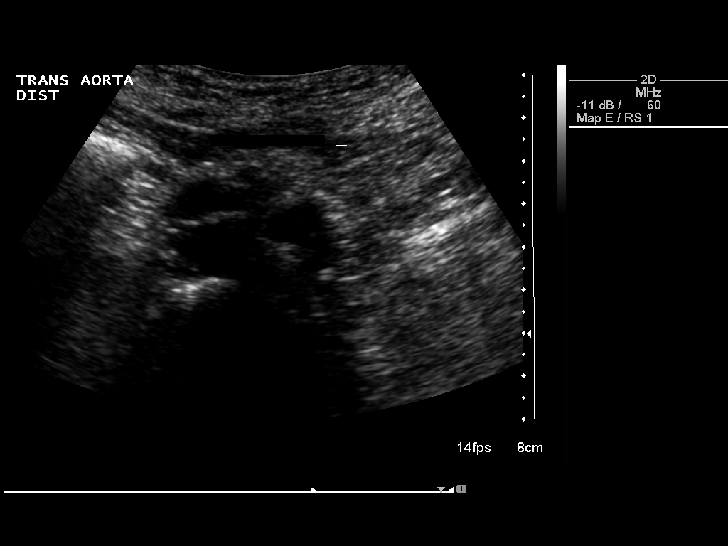
[im 23/28]
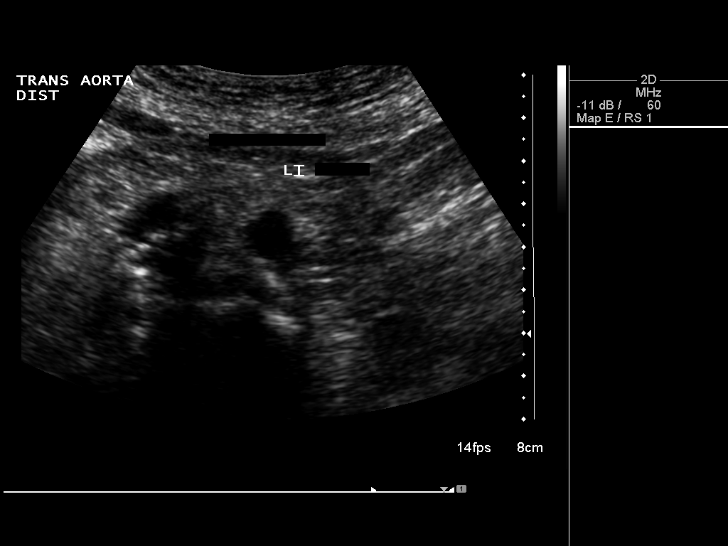
[im 25/28]
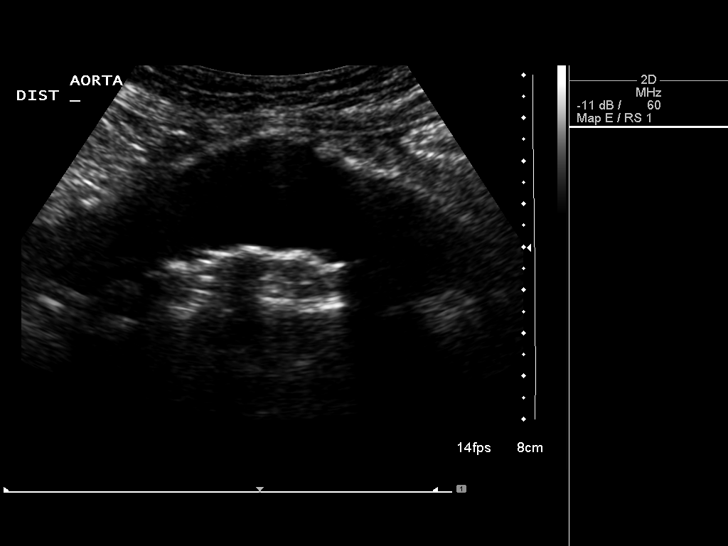
[im 28/28]
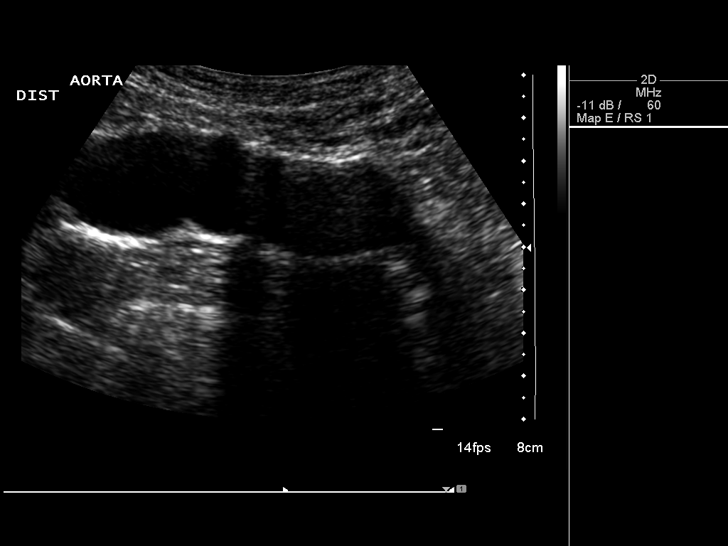

[14 of 25 positions shown; findings below may reference images not displayed]

Ultrasound January 28, 2010.

Abdominal Aorta:   Abdominal aortic aneurysm is present, increased
from priors. The abdominal aorta appears atherosclerotic and
ectatic.

      Maximum AP diameter:  3.2 cm.
      Maximum TRV diameter:  3.4 cm.

Right common iliac maximal diameter is 1.2 cm.  Left common iliac
maximal diameter  of 1.5 cm.
IMPRESSION: Abdominal aortic aneurysm with maximal diameter of 3.4 cm.

## 2012-06-23 ENCOUNTER — Inpatient Hospital Stay (HOSPITAL_COMMUNITY)
Admission: EM | Admit: 2012-06-23 | Discharge: 2012-06-27 | DRG: 065 | Disposition: A | Discharge status: Rehabilitation Facility | Payer: Medicare Other | Attending: Internal Medicine | Admitting: Internal Medicine

## 2012-06-23 ENCOUNTER — Emergency Department (HOSPITAL_COMMUNITY): Payer: Medicare Other

## 2012-06-23 ENCOUNTER — Encounter (HOSPITAL_COMMUNITY): Payer: Self-pay | Admitting: Emergency Medicine

## 2012-06-23 DIAGNOSIS — F0390 Unspecified dementia without behavioral disturbance: Secondary | ICD-10-CM | POA: Diagnosis present

## 2012-06-23 DIAGNOSIS — I10 Essential (primary) hypertension: Secondary | ICD-10-CM

## 2012-06-23 DIAGNOSIS — N400 Enlarged prostate without lower urinary tract symptoms: Secondary | ICD-10-CM

## 2012-06-23 DIAGNOSIS — I6200 Nontraumatic subdural hemorrhage, unspecified: Principal | ICD-10-CM | POA: Diagnosis present

## 2012-06-23 DIAGNOSIS — E039 Hypothyroidism, unspecified: Secondary | ICD-10-CM

## 2012-06-23 DIAGNOSIS — T45515A Adverse effect of anticoagulants, initial encounter: Secondary | ICD-10-CM

## 2012-06-23 DIAGNOSIS — S065X9A Traumatic subdural hemorrhage with loss of consciousness of unspecified duration, initial encounter: Secondary | ICD-10-CM

## 2012-06-23 DIAGNOSIS — I4891 Unspecified atrial fibrillation: Secondary | ICD-10-CM | POA: Diagnosis present

## 2012-06-23 DIAGNOSIS — R040 Epistaxis: Secondary | ICD-10-CM

## 2012-06-23 DIAGNOSIS — L600 Ingrowing nail: Secondary | ICD-10-CM

## 2012-06-23 DIAGNOSIS — I4819 Other persistent atrial fibrillation: Secondary | ICD-10-CM

## 2012-06-23 DIAGNOSIS — I739 Peripheral vascular disease, unspecified: Secondary | ICD-10-CM

## 2012-06-23 DIAGNOSIS — G8190 Hemiplegia, unspecified affecting unspecified side: Secondary | ICD-10-CM | POA: Diagnosis present

## 2012-06-23 HISTORY — DX: Unspecified dementia, unspecified severity, without behavioral disturbance, psychotic disturbance, mood disturbance, and anxiety: F03.90

## 2012-06-23 HISTORY — DX: Unspecified atrial fibrillation: I48.91

## 2012-06-23 HISTORY — DX: Disorder of thyroid, unspecified: E07.9

## 2012-06-23 HISTORY — DX: Essential (primary) hypertension: I10

## 2012-06-23 HISTORY — DX: Benign prostatic hyperplasia without lower urinary tract symptoms: N40.0

## 2012-06-23 LAB — URINALYSIS, ROUTINE W REFLEX MICROSCOPIC
Bilirubin Urine: NEGATIVE
Ketones, ur: 15 mg/dL — AB
Urobilinogen, UA: 1 mg/dL (ref 0.0–1.0)

## 2012-06-23 LAB — CBC
HCT: 34.1 % — ABNORMAL LOW (ref 39.0–52.0)
MCV: 97.8 fL (ref 78.0–100.0)
MCV: 98.3 fL (ref 78.0–100.0)
Platelets: 148 10*3/uL — ABNORMAL LOW (ref 150–400)
RBC: 3.64 MIL/uL — ABNORMAL LOW (ref 4.22–5.81)
RBC: 3.47 MIL/uL — ABNORMAL LOW (ref 4.22–5.81)
WBC: 9.1 10*3/uL (ref 4.0–10.5)
WBC: 7.6 10*3/uL (ref 4.0–10.5)

## 2012-06-23 LAB — GLUCOSE, CAPILLARY
Glucose-Capillary: 93 mg/dL (ref 70–99)
Glucose-Capillary: 93 mg/dL (ref 70–99)
Glucose-Capillary: 108 mg/dL — ABNORMAL HIGH (ref 70–99)

## 2012-06-23 LAB — COMPREHENSIVE METABOLIC PANEL
ALT: 13 U/L (ref 0–53)
ALT: 13 U/L (ref 0–53)
AST: 23 U/L (ref 0–37)
Albumin: 3.4 g/dL — ABNORMAL LOW (ref 3.5–5.2)
Albumin: 3.3 g/dL — ABNORMAL LOW (ref 3.5–5.2)
Alkaline Phosphatase: 58 U/L (ref 39–117)
Alkaline Phosphatase: 58 U/L (ref 39–117)
Calcium: 9.7 mg/dL (ref 8.4–10.5)
Creatinine, Ser: 1.06 mg/dL (ref 0.50–1.35)
Potassium: 3.5 mEq/L (ref 3.5–5.1)
Potassium: 3.4 mEq/L — ABNORMAL LOW (ref 3.5–5.1)
Sodium: 139 mEq/L (ref 135–145)
Sodium: 139 mEq/L (ref 135–145)
Total Bilirubin: 1.9 mg/dL — ABNORMAL HIGH (ref 0.3–1.2)
Total Protein: 6.5 g/dL (ref 6.0–8.3)

## 2012-06-23 LAB — PROTIME-INR
INR: 2.04 — ABNORMAL HIGH (ref 0.00–1.49)
INR: 1.29 (ref 0.00–1.49)
Prothrombin Time: 22.2 seconds — ABNORMAL HIGH (ref 11.6–15.2)

## 2012-06-23 LAB — URINE MICROSCOPIC-ADD ON

## 2012-06-23 LAB — CORTISOL: Cortisol, Plasma: 16.2 ug/dL

## 2012-06-23 MED ORDER — SODIUM CHLORIDE 0.9 % IV SOLN
INTRAVENOUS | Status: DC
  Administered 2012-06-23: 17:00:00 via INTRAVENOUS

## 2012-06-23 MED ORDER — ANTIINHIBITOR COAGULANT CMPLX IV SOLR
500.0000 [IU] | INTRAVENOUS | Status: DC
  Filled 2012-06-23: qty 500

## 2012-06-23 MED ORDER — POTASSIUM CHLORIDE 10 MEQ/100ML IV SOLN
INTRAVENOUS | Status: AC
  Filled 2012-06-23: qty 100

## 2012-06-23 MED ORDER — LEVOTHYROXINE SODIUM 100 MCG IV SOLR
25.0000 ug | Freq: Every day | INTRAVENOUS | Status: DC
  Administered 2012-06-24 – 2012-06-26 (×3): 26 ug via INTRAVENOUS
  Filled 2012-06-23 (×3): qty 1.3

## 2012-06-23 MED ORDER — HYDRALAZINE HCL 20 MG/ML IJ SOLN
10.0000 mg | INTRAMUSCULAR | Status: DC | PRN
  Administered 2012-06-23 – 2012-06-24 (×2): 10 mg via INTRAVENOUS
  Filled 2012-06-23 (×3): qty 1

## 2012-06-23 MED ORDER — POTASSIUM CHLORIDE 10 MEQ/100ML IV SOLN
10.0000 meq | INTRAVENOUS | Status: AC
  Administered 2012-06-23 (×2): 10 meq via INTRAVENOUS
  Filled 2012-06-23: qty 100

## 2012-06-23 MED ORDER — VITAMIN K1 10 MG/ML IJ SOLN
10.0000 mg | Freq: Once | INTRAVENOUS | Status: DC
  Filled 2012-06-23: qty 1

## 2012-06-23 MED ORDER — INSULIN ASPART 100 UNIT/ML ~~LOC~~ SOLN
0.0000 [IU] | SUBCUTANEOUS | Status: DC
  Administered 2012-06-23 – 2012-06-26 (×4): 1 [IU] via SUBCUTANEOUS

## 2012-06-23 MED ORDER — MORPHINE SULFATE 4 MG/ML IJ SOLN
INTRAMUSCULAR | Status: AC
  Administered 2012-06-23: 4 mg
  Filled 2012-06-23: qty 1

## 2012-06-23 MED ORDER — MIDAZOLAM HCL 2 MG/2ML IJ SOLN
INTRAMUSCULAR | Status: AC
  Administered 2012-06-23: 2 mg
  Filled 2012-06-23: qty 2

## 2012-06-23 MED ORDER — PANTOPRAZOLE SODIUM 40 MG IV SOLR
40.0000 mg | Freq: Every day | INTRAVENOUS | Status: DC
  Administered 2012-06-23 – 2012-06-26 (×4): 40 mg via INTRAVENOUS
  Filled 2012-06-23 (×5): qty 40

## 2012-06-23 MED ORDER — ANTIINHIBITOR COAGULANT CMPLX IV SOLR
477.0000 [IU] | INTRAVENOUS | Status: AC
  Administered 2012-06-23: 477 [IU] via INTRAVENOUS
  Filled 2012-06-23: qty 477

## 2012-06-23 MED ORDER — VITAMIN K1 10 MG/ML IJ SOLN
10.0000 mg | INTRAMUSCULAR | Status: AC
  Administered 2012-06-23: 10 mg via INTRAVENOUS
  Filled 2012-06-23: qty 1

## 2012-06-23 MED ORDER — DEXTROSE 5 % IV SOLN
1.0000 g | INTRAVENOUS | Status: DC
  Administered 2012-06-23 – 2012-06-25 (×3): 1 g via INTRAVENOUS
  Filled 2012-06-23 (×4): qty 10

## 2012-06-23 NOTE — H&P (Signed)
PULMONARY  / CRITICAL CARE MEDICINE  Name: [name] MRN: [id number] DOB: February 13, 1920    LOS: 0  REFERRING MD :  EDP  CHIEF COMPLAINT:  AMS with left SDH  LINES / TUBES:  CULTURES:  ANTIBIOTICS:   SIGNIFICANT EVENTS:  1-4 sdh with plan to go to or. Reversal on going  LEVEL OF CARE:  icu PRIMARY SERVICE:  pccm CONSULTANTS:  NS CODE STATUS DIET:  NPO DVT Px:  PAS GI Px:  PPI  HISTORY OF PRESENT ILLNESS:   79 yo with dementia but was dancing New Years day and on 1-4 noted to be leaning to right. Taken to ED and CT scan revealed SDH on the left. NS (Gram) evaluated with plan to go to or after coumadin reversed per rapid protocol. PCCM asked to admit.  PAST MEDICAL HISTORY :  Past Medical History  Diagnosis Date  . Dementia   . A-fib   . Enlarged prostate   . Thyroid disease     hypothyroid  . Hypertension   b Past Surgical History  Procedure Date  . Eye surgery    Prior to Admission medications   Medication Sig Start Date End Date Taking? Authorizing Provider  Cholecalciferol (VITAMIN D PO) Take 1 tablet by mouth every morning.   Yes Historical Provider, MD  donepezil (ARICEPT) 5 MG tablet Take 5 mg by mouth daily at 12 noon.   Yes Historical Provider, MD  doxazosin (CARDURA) 2 MG tablet Take 2 mg by mouth at bedtime.   Yes Historical Provider, MD  finasteride (PROSCAR) 5 MG tablet Take 5 mg by mouth at bedtime.   Yes Historical Provider, MD  hydrochlorothiazide (HYDRODIURIL) 25 MG tablet Take 12.5 mg by mouth daily.   Yes Historical Provider, MD  levothyroxine (SYNTHROID, LEVOTHROID) 50 MCG tablet Take 50 mcg by mouth daily at 12 noon.   Yes Historical Provider, MD  lisinopril (PRINIVIL,ZESTRIL) 10 MG tablet Take 5 mg by mouth daily.   Yes Historical Provider, MD  metoprolol (LOPRESSOR) 50 MG tablet Take 25 mg by mouth 2 (two) times daily.   Yes Historical Provider, MD  simvastatin (ZOCOR) 40 MG tablet Take 20 mg by mouth every evening.   Yes Historical  Provider, MD   Allergies  Allergen Reactions  . Contrast Media (Iodinated Diagnostic Agents)     rash    FAMILY HISTORY:  No family history on file. SOCIAL HISTORY:  reports that he has never smoked. He does not have any smokeless tobacco history on file. He reports that he drinks alcohol. He reports that he does not use illicit drugs.  REVIEW OF SYSTEMS: NA   INTERVAL HISTORY:   VITAL SIGNS: Temp:  [98.1 F (36.7 C)] 98.1 F (36.7 C) (01/04 1215) Pulse Rate:  [73-83] 78  (01/04 1530) Resp:  [16-20] 18  (01/04 1530) BP: (160-193)/(82-113) 182/113 mmHg (01/04 1530) SpO2:  [94 %-97 %] 97 % (01/04 1530) HEMODYNAMICS:   VENTILATOR SETTINGS:   INTAKE / OUTPUT: Intake/Output    None     PHYSICAL EXAMINATION: General:  WNWDEWM Neuro:  Rt side weakness. Non verbal but follows commands. Tracks and  follows HEENT:  No LAN, JVD Cardiovascular:  HSIR IR with CVR Lungs:  diminished in bases Abdomen:  +bs Musculoskeletal:  intact Skin:  Warm mild lower ext edema   LABS: Cbc  Lab 06/23/12 1235  WBC 9.1  HGB 12.0*  HCT 35.6*  PLT 145*    Chemistry   Lab 06/23/12 1235  NA  139  K 3.5  CL 100  CO2 29  BUN 22  CREATININE 1.06  CALCIUM 9.6  MG --  PHOS --  GLUCOSE 97    Liver fxn  Lab 06/23/12 1235  AST 22  ALT 13  ALKPHOS 58  BILITOT 1.9*  PROT 6.5  ALBUMIN 3.4*   coags  Lab 06/23/12 1235  APTT --  INR 2.04*   Sepsis markers No results found for this basename: LATICACIDVEN:3,PROCALCITON:3 in the last 168 hours Cardiac markers No results found for this basename: CKTOTAL:3,CKMB:3,TROPONINI:3 in the last 168 hours BNP No results found for this basename: PROBNP:3 in the last 168 hours ABG No results found for this basename: PHART:3,PCO2ART:3,PO2ART:3,HCO3:3,TCO2:3 in the last 168 hours  CBG trend  Lab 06/23/12 1244  GLUCAP 93    IMAGING: Dg Chest 2 View  06/23/2012  *RADIOLOGY REPORT*  Clinical Data: Patient found unresponsive earlier  today, and found on the earlier imaging to have a large left sided subdural hematoma.  Preoperative respiratory evaluation.  CHEST - 2 VIEW  Comparison: Two-view chest x-ray 01/09/2012, 07/23/2010, 07/17/2008.  Findings: Cardiac silhouette enlarged but stable.  Thoracic aorta tortuous and atherosclerotic, unchanged.  Hilar and mediastinal contours otherwise unremarkable.  Mild diffuse interstitial pulmonary edema, new since the prior examinations.  No pleural effusions.  No confluent airspace consolidation.  Visualized bony thorax intact with slight scoliosis convex right.  IMPRESSION: Mild CHF and/or fluid overload, with stable cardiomegaly and mild diffuse interstitial pulmonary edema.   Original Report Authenticated By: Hulan Saas, M.D.    Ct Head Wo Contrast  06/23/2012  *RADIOLOGY REPORT*  Clinical Data: Altered mental status.  Found on floor.  CT HEAD WITHOUT CONTRAST  Technique:  Contiguous axial images were obtained from the base of the skull through the vertex without contrast.  Comparison: None.  Findings: Large left-sided subdural hematoma measuring approximately 2.1 cm in thickness.  This has low density fluid anteriorly and layering high density blood posteriorly.  There is significant mass effect on the left cerebral hemisphere.  There is extensive midline shift measuring approximately 18 mm.  There is compression of the left lateral ventricle.  No hydrocephalus.  No acute infarct.  Negative for skull fracture.  IMPRESSION: Large mixed density left sided subdural hematoma with 18 mm midline shift.  Critical Value/emergent results were called by telephone at the time of interpretation on 06/23/2012 at 1440 hours to Dr. Ignacia Palma, who verbally acknowledged these results.   Original Report Authenticated By: Janeece Riggers, M.D.     ECG: afib with vr 74  DIAGNOSES: Active Problems:  SDH (subdural hematoma)  Dementia  PVD (peripheral vascular disease)  BPH (benign prostatic hyperplasia)   Thyroid activity decreased  HTN (hypertension)  Atrial fibrillation, persistent    ASSESSMENT / PLAN:  PULMONARY  ASSESSMENT: High risk post op resp failure PLAN:   -eval post surgery, consider remain intubated post op -pcxr in am   CARDIOVASCULAR AFIB PVD post left CEA HTN in setting SDH ASSESSMENT: PLAN:  -dc coumadin, reversal given -tx hypertension, consider sys goals 140-160, add hydralazine now for control -HR in 75-70, avoiding beta blocker for now -tele  RENAL Lab Results  Component Value Date   CREATININE 1.06 06/23/2012   CREATININE 1.0 08/26/2008    ASSESSMENT:  No acute issue PLAN:   Avoid free water with concern brain edema and post op edema Chem in am  GASTROINTESTINAL  ASSESSMENT:  High risk dysphagia PLAN:   Npo ppi slp post op will be required  HEMATOLOGIC coagulopathic  ASSESSMENT:  High risk gastric ulcer and dvt (after correction) INR 2 PLAN:  -rapid reversal protocol per pharmacy given, await coags repeat -ppi -cbc in am  -coags in am  scd  INFECTIOUS  ASSESSMENT:  No acute issue PLAN:   At rsk asppiration Repeat pcxr in am   ENDOCRINE  ASSESSMENT:   Hypothroid PLAN:   -iv synthroid Assess cbg  NEUROLOGIC Left SDH with right hemiparesis. Dementia ASSESSMENT:   AMS PLAN:   -reverse coumadin -no seizure noted, neurochecks -OR for evacuation once stable, per NS -upright  -BP control to some extent  CLINICAL SUMMARY:  89 with sdh on coumadin to go to OR after INR corrected. Consider post op to remain intubated  I have personally obtained a history, examined the patient, evaluated laboratory and imaging results, formulated the assessment and plan and placed orders. CRITICAL CARE: The patient is critically ill with multiple organ systems failure and requires high complexity decision making for assessment and support, frequent evaluation and titration of therapies, application of advanced monitoring technologies and  extensive interpretation of multiple databases. Critical Care Time devoted to patient care services described in this note is 30   minutes.   Mcarthur Rossetti. Tyson Alias, MD, FACP Pgr: [phone] [facility]

## 2012-06-23 NOTE — ED Provider Notes (Signed)
History     CSN: [id number]  Arrival date & time 06/23/12  1208   None     Chief Complaint  Patient presents with  . Altered Mental Status    (Consider location/radiation/quality/duration/timing/severity/associated sxs/prior treatment) HPI Comments: The patient is a 79 year old man who has a history of atrial fibrillation, on warfarin, with some degree of dementia. He seemed weak yesterday. He went to bed in a different room from his wife, and she found him on the floor this morning, apparently having been there all night.  He was therefore brought to [facility] ED for evaluation. Pt answers simple questions, but is unable to give his history or review of systems.  Patient is a 79 y.o. male presenting with altered mental status. The history is provided by the patient and a relative. No language interpreter was used.  Altered Mental Status This is a new problem. The current episode started yesterday. The problem occurs constantly. The problem has not changed since onset.Nothing aggravates the symptoms. Nothing relieves the symptoms. He has tried nothing for the symptoms.    Past Medical History  Diagnosis Date  . Dementia   . A-fib   . Enlarged prostate   . Thyroid disease     hypothyroid  . Hypertension     Past Surgical History  Procedure Date  . Eye surgery     No family history on file.  History  Substance Use Topics  . Smoking status: Never Smoker   . Smokeless tobacco: Not on file  . Alcohol Use: Yes     Comment: occ      Review of Systems  Unable to perform ROS: Mental status change  Psychiatric/Behavioral: Positive for altered mental status.    Allergies  Contrast media  Home Medications  No current outpatient prescriptions on file.  BP 164/82  Pulse 83  Temp 98.1 F (36.7 C) (Oral)  Resp 20  SpO2 96%  Physical Exam  Nursing note and vitals reviewed. Constitutional: He appears well-developed and well-nourished.       Somnolent but readily  arousable.  HENT:  Head: Normocephalic and atraumatic.  Right Ear: External ear normal.  Left Ear: External ear normal.  Mouth/Throat: Oropharynx is clear and moist.  Eyes: Conjunctivae normal and EOM are normal. Pupils are equal, round, and reactive to light.  Neck: Normal range of motion. Neck supple.  Cardiovascular: Normal rate, regular rhythm and normal heart sounds.   Pulmonary/Chest: Effort normal and breath sounds normal.  Abdominal: Soft. Bowel sounds are normal.  Musculoskeletal: Normal range of motion.  Neurological:       Arousable, able to answer simple questions.  No sensory or motor deficit.  Skin: Skin is warm and dry.  Psychiatric:       Unable to assess.    ED Course  CRITICAL CARE Performed by: Osvaldo Human Authorized by: Osvaldo Human Total critical care time: 30 minutes Critical care was necessary to treat or prevent imminent or life-threatening deterioration of the following conditions: Pt with altered mental status, who has atrial fibrillation on coumadin with INR of 2.04, has a left subdural hematoma, Call to Neurosurgery and Critical Care consultants. Critical care was time spent personally by me on the following activities: discussions with consultants, evaluation of patient's response to treatment, examination of patient, obtaining history from patient or surrogate, ordering and performing treatments and interventions, ordering and review of laboratory studies, ordering and review of radiographic studies, re-evaluation of patient's condition and review of old  charts.   (including critical care time)   Labs Reviewed  GLUCOSE, CAPILLARY  URINALYSIS, ROUTINE W REFLEX MICROSCOPIC  CBC  COMPREHENSIVE METABOLIC PANEL  PROTIME-INR   0:98 PM  Date: 06/23/2012  Rate: 71  Rhythm: atrial fibrillation  QRS Axis: left  Intervals: normal  ST/T Wave abnormalities: nonspecific T wave changes  Conduction Disutrbances:none  Narrative Interpretation:  Abnormal EKG  Old EKG Reviewed: none available   2:42 PM CT of head shows a large subdural hematoma on the left side.  His INR is 2.04.  Call to Dr. Wynetta Emery, neurosurgeon on call --> Dr. Wynetta Emery requests Internal Medicine to see pt to reverse his anticoagulation.  Call to Pharmacy to give Prothrombin complex, Vitamin C.  Call to [facility] --> referred to Pulmonary and Critical Care.    4:16 PM Pt seen by PCCM.Marland Kitchen  Pt is being admitted to Neuro ICU.  1. Subdural hematoma   2. Warfarin-induced coagulopathy            Carleene Cooper III, MD 06/23/12 [phone]

## 2012-06-23 NOTE — ED Notes (Signed)
Pt's wife reports she doesn't think has been taking all of his medications the past 2 days.

## 2012-06-23 NOTE — ED Notes (Signed)
Pt back from radiology. Dr. Lucila Maine at bedside.

## 2012-06-23 NOTE — ED Notes (Signed)
Per EMS - pt coming from home. Pt has had AMS x 2 days. Pt normally able to walk without assistance but today pt unable to walk on own. EMS started a IV in left arm. Pt has had urinary incontinence but normally doesn't have any issues. BP 170/110, CBG 93 HR 90, O2 sats 98% on 2L/min [place] RR 18. Pt has hx of dementia.

## 2012-06-23 NOTE — Procedures (Signed)
Preoperative diagnosis: Left-sided subacute on chronic subdural hematoma  Postoperative diagnosis: Same  Procedure: Bedside twist drill placement of subdural drain  Anesthesia: Sedation and local  Surgeon: Jillyn Hidden Olden Klauer  Procedure: After giving the patient 2 mg of Versed a formal grams of morphine the left side of his head was shaved prepped and draped in sterile fashion for a left frontal entry point after infiltration with 5 cc lidocaine with epi a stab incision was made a twist drill hole was made around copious point the dura was pierced with a spinal needle and a ventricular catheter was passed the stylette was removed upon entry through the dura and the catheter was advanced approximately 5 cm immediately dark old blood appearing drainage came out of tube and was draining to gravity bag briskly the catheter was sutured in place the incision was sutured in place and the patient was in stable condition after procedure.

## 2012-06-23 NOTE — Consult Note (Signed)
Reason for Consult: Subdural hematoma Referring Physician: Emergency department  [name] is an 79 y.o. male.  HPI: Patient is a very pleasant 79 year old gentleman who's had a history of atrial fibrillation he is on Coumadin most recent INR is around 2.5 he also has and mild dementia but he has been living at independent home and very active with his wife. His wife has noticed a decline over the last few days in his mental status especially today with difficulty and weakness of his right side and confusion. He has had a fall within the last couple weeks but this has not brought to the wrist extension the patient under plate the severity of it and he seem to be doing fine. With his decline in mental status today he comes the ER and workup has revealed a large subdural hematoma and we have been consult. Patient currently is confused and difficult to communicate with what is mild dementia.  Past Medical History  Diagnosis Date  . Dementia   . A-fib   . Enlarged prostate   . Thyroid disease     hypothyroid  . Hypertension     Past Surgical History  Procedure Date  . Eye surgery     No family history on file.  Social History:  reports that he has never smoked. He does not have any smokeless tobacco history on file. He reports that he drinks alcohol. He reports that he does not use illicit drugs.  Allergies:  Allergies  Allergen Reactions  . Contrast Media (Iodinated Diagnostic Agents)     rash    Medications: I have reviewed the patient's current medications.  Results for orders placed during the hospital encounter of 06/23/12 (from the past 48 hour(s))  CBC     Status: Abnormal   Collection Time   06/23/12 12:35 PM      Component Value Range Comment   WBC 9.1  4.0 - 10.5 K/uL    RBC 3.64 (*) 4.22 - 5.81 MIL/uL    Hemoglobin 12.0 (*) 13.0 - 17.0 g/dL    HCT 16.1 (*) 09.6 - 52.0 %    MCV 97.8  78.0 - 100.0 fL    MCH 33.0  26.0 - 34.0 pg    MCHC 33.7  30.0 - 36.0 g/dL     RDW 04.5  40.9 - 81.1 %    Platelets 145 (*) 150 - 400 K/uL   COMPREHENSIVE METABOLIC PANEL     Status: Abnormal   Collection Time   06/23/12 12:35 PM      Component Value Range Comment   Sodium 139  135 - 145 mEq/L    Potassium 3.5  3.5 - 5.1 mEq/L    Chloride 100  96 - 112 mEq/L    CO2 29  19 - 32 mEq/L    Glucose, Bld 97  70 - 99 mg/dL    BUN 22  6 - 23 mg/dL    Creatinine, Ser 9.14  0.50 - 1.35 mg/dL    Calcium 9.6  8.4 - 78.2 mg/dL    Total Protein 6.5  6.0 - 8.3 g/dL    Albumin 3.4 (*) 3.5 - 5.2 g/dL    AST 22  0 - 37 U/L    ALT 13  0 - 53 U/L    Alkaline Phosphatase 58  39 - 117 U/L    Total Bilirubin 1.9 (*) 0.3 - 1.2 mg/dL    GFR calc non Af Amer 60 (*) >90 mL/min  GFR calc Af Amer 69 (*) >90 mL/min   PROTIME-INR     Status: Abnormal   Collection Time   06/23/12 12:35 PM      Component Value Range Comment   Prothrombin Time 22.2 (*) 11.6 - 15.2 seconds    INR 2.04 (*) 0.00 - 1.49   GLUCOSE, CAPILLARY     Status: Normal   Collection Time   06/23/12 12:44 PM      Component Value Range Comment   Glucose-Capillary 93  70 - 99 mg/dL   URINALYSIS, ROUTINE W REFLEX MICROSCOPIC     Status: Abnormal   Collection Time   06/23/12  2:13 PM      Component Value Range Comment   Color, Urine YELLOW  YELLOW    APPearance CLEAR  CLEAR    Specific Gravity, Urine 1.020  1.005 - 1.030    pH 6.0  5.0 - 8.0    Glucose, UA NEGATIVE  NEGATIVE mg/dL    Hgb urine dipstick MODERATE (*) NEGATIVE    Bilirubin Urine NEGATIVE  NEGATIVE    Ketones, ur 15 (*) NEGATIVE mg/dL    Protein, ur 30 (*) NEGATIVE mg/dL    Urobilinogen, UA 1.0  0.0 - 1.0 mg/dL    Nitrite NEGATIVE  NEGATIVE    Leukocytes, UA NEGATIVE  NEGATIVE   URINE MICROSCOPIC-ADD ON     Status: Normal   Collection Time   06/23/12  2:13 PM      Component Value Range Comment   RBC / HPF 21-50  <3 RBC/hpf     Dg Chest 2 View  06/23/2012  *RADIOLOGY REPORT*  Clinical Data: Patient found unresponsive earlier today, and found on the  earlier imaging to have a large left sided subdural hematoma.  Preoperative respiratory evaluation.  CHEST - 2 VIEW  Comparison: Two-view chest x-ray 01/09/2012, 07/23/2010, 07/17/2008.  Findings: Cardiac silhouette enlarged but stable.  Thoracic aorta tortuous and atherosclerotic, unchanged.  Hilar and mediastinal contours otherwise unremarkable.  Mild diffuse interstitial pulmonary edema, new since the prior examinations.  No pleural effusions.  No confluent airspace consolidation.  Visualized bony thorax intact with slight scoliosis convex right.  IMPRESSION: Mild CHF and/or fluid overload, with stable cardiomegaly and mild diffuse interstitial pulmonary edema.   Original Report Authenticated By: Hulan Saas, M.D.    Ct Head Wo Contrast  06/23/2012  *RADIOLOGY REPORT*  Clinical Data: Altered mental status.  Found on floor.  CT HEAD WITHOUT CONTRAST  Technique:  Contiguous axial images were obtained from the base of the skull through the vertex without contrast.  Comparison: None.  Findings: Large left-sided subdural hematoma measuring approximately 2.1 cm in thickness.  This has low density fluid anteriorly and layering high density blood posteriorly.  There is significant mass effect on the left cerebral hemisphere.  There is extensive midline shift measuring approximately 18 mm.  There is compression of the left lateral ventricle.  No hydrocephalus.  No acute infarct.  Negative for skull fracture.  IMPRESSION: Large mixed density left sided subdural hematoma with 18 mm midline shift.  Critical Value/emergent results were called by telephone at the time of interpretation on 06/23/2012 at 1440 hours to Dr. Ignacia Palma, who verbally acknowledged these results.   Original Report Authenticated By: Janeece Riggers, M.D.     @ROS @ Blood pressure 169/92, pulse 75, temperature 98.1 F (36.7 C), temperature source Oral, resp. rate 18, SpO2 95.00%. Patient is awake, confused, he does follow some simple commands with  a lot of prompting he  does perseverate and does not appear that he has a complete awareness of what is going on around him. He does appear to have a right pronator drift moves both extremities fairly well to the best I can tell with the effort he is willing to provide. His pupils are equal his extraocular movements appear to be intact. Assessment/Plan: 79 year old white male with a history of mild dementia and atrial fibrillation on Coumadin with an elevated INR presents with a large left-sided subdural hematoma. I've had extensive discussions with the patient's wife the patient's wife daughter who is her daughter-in-law to the patient the process of a 52 year old gentleman and a large subdural hematoma. I do not think that based on the density of the subdural hematoma this could be something to be treated with simply bur holes that he would require full craniotomy. I've explained the patient's past medical history is mild dementia and is a defibrillation that the chances of him coming to surgery and being able to restore him to his preinjury state of being independent at home resume his artistic passed down to the level he could before without residual cognitive or physical deficits was extremely low. In the setting of a dominant hemisphere craniotomy and patient 79 years old more than likely he would be needing to be placed in a nursing home with some assistance in feeding and with getting around. I think is also significant risk of general anesthesia with his decline in neurologic status and his age I would not be able to get off the ventilator war being at significant risk of aspiration pneumonia developing postoperatively. The patient however does have a remarkable baseline neurologic status to start with a side from his mild dementia prior to this injury. So I have initiated the rapid reversal protocol with the ER nursing the daughter-in-law in the wife will be getting all the patient's sons and will have  these discussions further when they arrive I do think that a decision of whether or not to operate should be reached expediently but does not need to be done prior to reversal of his coagulopathy. I've asked critical-care doctors to evaluate and admit an elevated maintain involvement as a Research scientist (medical).  Draco Malczewski P 06/23/2012, 3:52 PM

## 2012-06-23 NOTE — ED Notes (Signed)
Lab tech at bedside drawing labs.

## 2012-06-23 NOTE — ED Notes (Signed)
Family reports pt normally up walking around and can perform all ADLs by self. A few days ago he went out ballroom dancing with his wife and didn't have any issues. About 2 days ago pt became more legtharic than normal, unable to walk on own, starting having urinary incontinence. Pt has hx of dementia so wife reports he has his "good and bad" days and didn't think too much of it at first but now she thinks something is wrong.  Pt reports sometimes it burns when he pees. Pt denies any pain.

## 2012-06-24 ENCOUNTER — Inpatient Hospital Stay (HOSPITAL_COMMUNITY): Payer: Medicare Other

## 2012-06-24 LAB — URINE MICROSCOPIC-ADD ON

## 2012-06-24 LAB — BLOOD GAS, ARTERIAL
TCO2: 29.3 mmol/L (ref 0–100)
pCO2 arterial: 45.4 mmHg — ABNORMAL HIGH (ref 35.0–45.0)
pH, Arterial: 7.406 (ref 7.350–7.450)

## 2012-06-24 LAB — GLUCOSE, CAPILLARY
Glucose-Capillary: 92 mg/dL (ref 70–99)
Glucose-Capillary: 89 mg/dL (ref 70–99)
Glucose-Capillary: 84 mg/dL (ref 70–99)
Glucose-Capillary: 90 mg/dL (ref 70–99)

## 2012-06-24 LAB — BASIC METABOLIC PANEL
BUN: 21 mg/dL (ref 6–23)
Chloride: 102 mEq/L (ref 96–112)
GFR calc Af Amer: 82 mL/min — ABNORMAL LOW (ref >90)
Glucose, Bld: 103 mg/dL — ABNORMAL HIGH (ref 70–99)
Potassium: 5 mEq/L (ref 3.5–5.1)
Sodium: 138 mEq/L (ref 135–145)

## 2012-06-24 LAB — URINALYSIS, ROUTINE W REFLEX MICROSCOPIC
Glucose, UA: NEGATIVE mg/dL
Protein, ur: 100 mg/dL — AB
Specific Gravity, Urine: 1.022 (ref 1.005–1.030)
Urobilinogen, UA: 1 mg/dL (ref 0.0–1.0)

## 2012-06-24 LAB — CBC
HCT: 35 % — ABNORMAL LOW (ref 39.0–52.0)
Hemoglobin: 12.2 g/dL — ABNORMAL LOW (ref 13.0–17.0)
RDW: 14.4 % (ref 11.5–15.5)
WBC: 9.2 10*3/uL (ref 4.0–10.5)

## 2012-06-24 LAB — PROTIME-INR
INR: 1.27 (ref 0.00–1.49)
Prothrombin Time: 15 seconds (ref 11.6–15.2)

## 2012-06-24 NOTE — H&P (Signed)
PULMONARY  / CRITICAL CARE MEDICINE  Name: [name] MRN: [id number] DOB: Dec 07, 1919    LOS: 1  REFERRING MD :  EDP  CHIEF COMPLAINT:  AMS with left SDH  LINES / TUBES: Drain head 1/4>>>  CULTURES:  ANTIBIOTICS:  SIGNIFICANT EVENTS:  1-4 sdh with plan to go to or. Reversal on going 1/5- improved neurostatus  LEVEL OF CARE:  icu PRIMARY SERVICE:  pccm CONSULTANTS:  NS CODE STATUS DIET:  NPO DVT Px:  PAS GI Px:  PPI  INTERVAL HISTORY:  1-4 IVC drain placed  VITAL SIGNS: Temp:  [97.8 F (36.6 C)-98.7 F (37.1 C)] 98.7 F (37.1 C) (01/05 0800) Pulse Rate:  [68-95] 85  (01/05 0800) Resp:  [11-21] 17  (01/05 0800) BP: (110-193)/(65-113) 144/74 mmHg (01/05 0800) SpO2:  [70 %-99 %] 90 % (01/05 0800) Weight:  [81.2 kg (179 lb 0.2 oz)-82.4 kg (181 lb 10.5 oz)] 82.4 kg (181 lb 10.5 oz) (01/05 0600) HEMODYNAMICS:   VENTILATOR SETTINGS:   INTAKE / OUTPUT: Intake/Output      01/04 0701 - 01/05 0700 01/05 0701 - 01/06 0700   I.V. (mL/kg) 722.5 (8.8) 50 (0.6)   IV Piggyback 250    Total Intake(mL/kg) 972.5 (11.8) 50 (0.6)   Urine (mL/kg/hr) 890 (0.5) 150   Drains 448 0   Total Output 1338 150   Net -365.5 -100          PHYSICAL EXAMINATION: General:  WNWDEWM Neuro:  Rt side weakness. Very talkative but confused and agitated HEENT:  No LAN, JVD Cardiovascular:  HSIR IR with CVR Lungs:  diminished in bases Abdomen:  +bs Musculoskeletal:  intact Skin:  Warm mild lower ext edema   LABS: Cbc  Lab 06/24/12 0603 06/23/12 1644 06/23/12 1235  WBC 9.2 -- --  HGB 12.2* 11.3* 12.0*  HCT 35.0* 34.1* 35.6*  PLT 139* 148* 145*    Chemistry   Lab 06/24/12 0603 06/23/12 1644 06/23/12 1235  NA 138 139 139  K 5.0 3.4* 3.5  CL 102 100 100  CO2 27 28 29   BUN 21 21 22   CREATININE 0.97 0.98 1.06  CALCIUM 9.0 9.7 9.6  MG -- 1.6 --  PHOS -- 2.9 --  GLUCOSE 103* 94 97    Liver fxn  Lab 06/23/12 1644 06/23/12 1235  AST 23 22  ALT 13 13  ALKPHOS 58 58    BILITOT 2.2* 1.9*  PROT 6.5 6.5  ALBUMIN 3.3* 3.4*   coags  Lab 06/24/12 0603 06/23/12 2208 06/23/12 1644  APTT -- -- 30  INR 1.20 1.39 1.29   Sepsis markers No results found for this basename: LATICACIDVEN:3,PROCALCITON:3 in the last 168 hours Cardiac markers No results found for this basename: CKTOTAL:3,CKMB:3,TROPONINI:3 in the last 168 hours BNP No results found for this basename: PROBNP:3 in the last 168 hours ABG  Lab 06/24/12 0402  PHART 7.406  PCO2ART 45.4*  PO2ART 87.7  HCO3 27.9*  TCO2 29.3    CBG trend  Lab 06/24/12 0746 06/24/12 0347 06/23/12 2329 06/23/12 2007 06/23/12 1726  GLUCAP 89 92 108* 129* 93    IMAGING: Dg Chest 2 View  06/23/2012  *RADIOLOGY REPORT*  Clinical Data: Patient found unresponsive earlier today, and found on the earlier imaging to have a large left sided subdural hematoma.  Preoperative respiratory evaluation.  CHEST - 2 VIEW  Comparison: Two-view chest x-ray 01/09/2012, 07/23/2010, 07/17/2008.  Findings: Cardiac silhouette enlarged but stable.  Thoracic aorta tortuous and atherosclerotic, unchanged.  Hilar and  mediastinal contours otherwise unremarkable.  Mild diffuse interstitial pulmonary edema, new since the prior examinations.  No pleural effusions.  No confluent airspace consolidation.  Visualized bony thorax intact with slight scoliosis convex right.  IMPRESSION: Mild CHF and/or fluid overload, with stable cardiomegaly and mild diffuse interstitial pulmonary edema.   Original Report Authenticated By: Hulan Saas, M.D.    Ct Head Wo Contrast  06/24/2012  *RADIOLOGY REPORT*  Clinical Data: Followup subdural hematoma  CT HEAD WITHOUT CONTRAST  Technique:  Contiguous axial images were obtained from the base of the skull through the vertex without contrast.  Comparison: 06/23/2012  Findings: Interval placement of a left subdural drain. Significant decrease in size of left frontoparietal subdural hematoma since previous exam, now up to 12 mm  thick, previously 24 mm. Small amount of high attenuation acute hemorrhage within the subdural space. Significantly decreased mass effect upon the left hemisphere. Decreased left-to-right midline shift, currently 8 mm, previously 18 mm. Scattered artifacts. No intraparenchymal hemorrhage or definite evidence of infarction. Small vessel chronic ischemic changes of deep cerebral white matter. Artifacts prevent exclusion of a minimal right subdural collection. Posterior fossa unremarkable. Bones and sinuses unremarkable.  IMPRESSION: Significant decrease in size of left frontoparietal subdural hematoma post drainage catheter placement. Small amount of new high attenuation hemorrhage is seen within the left subdural space. Decreased mass effect upon left hemisphere and decreased left-to- right midline shift.   Original Report Authenticated By: Ulyses Southward, M.D.    Ct Head Wo Contrast  06/23/2012  *RADIOLOGY REPORT*  Clinical Data: Altered mental status.  Found on floor.  CT HEAD WITHOUT CONTRAST  Technique:  Contiguous axial images were obtained from the base of the skull through the vertex without contrast.  Comparison: None.  Findings: Large left-sided subdural hematoma measuring approximately 2.1 cm in thickness.  This has low density fluid anteriorly and layering high density blood posteriorly.  There is significant mass effect on the left cerebral hemisphere.  There is extensive midline shift measuring approximately 18 mm.  There is compression of the left lateral ventricle.  No hydrocephalus.  No acute infarct.  Negative for skull fracture.  IMPRESSION: Large mixed density left sided subdural hematoma with 18 mm midline shift.  Critical Value/emergent results were called by telephone at the time of interpretation on 06/23/2012 at 1440 hours to Dr. Ignacia Palma, who verbally acknowledged these results.   Original Report Authenticated By: Janeece Riggers, M.D.    Portable Chest Xray In Am  06/24/2012  *RADIOLOGY REPORT*   Clinical Data: Planned intubation, subdural hematoma  PORTABLE CHEST - 1 VIEW  Comparison: 06/23/2012  Findings: New patchy right upper lobe airspace opacity is noted. Increased left midlung zone linear presumed atelectasis.  Moderate enlargement of the cardiomediastinal silhouette without edema.  IMPRESSION: New patchy right upper lobe airspace opacity, which could represent aspiration or atelectasis, less likely asymmetric edema.  Increased linear left mid lung zone presumed atelectasis.   Original Report Authenticated By: Christiana Pellant, M.D.     ECG: afib with vr 74  DIAGNOSES: Active Problems:  SDH (subdural hematoma)  Dementia  PVD (peripheral vascular disease)  BPH (benign prostatic hyperplasia)  Thyroid activity decreased  HTN (hypertension)  Atrial fibrillation, persistent    ASSESSMENT / PLAN:  PULMONARY  ASSESSMENT: High risk post op resp failure, int edema PLAN:   pcxr follow up Lasix if desats  CARDIOVASCULAR AFIB PVD post left CEA HTN in setting SDH ASSESSMENT: PLAN:  -No further coumadin, reversal given -140-160, hydralazine on order -likely  can control BP further in am  -tele -continue neg balance, see pcxr  RENAL Lab Results  Component Value Date   CREATININE 0.97 06/24/2012   CREATININE 0.98 06/23/2012   CREATININE 1.06 06/23/2012    ASSESSMENT:  No acute issue PLAN:   Avoid free water Chem in am Low threshold lasix  GASTROINTESTINAL  ASSESSMENT:  High risk dysphagia PLAN:   Npo ppi Swallow eval before any po  HEMATOLOGIC coagulopathic  ASSESSMENT:  High risk gastric ulcer and dvt (after correction) INR 2 PLAN:  -ppi inr wnl -cbc in am -scd  INFECTIOUS  ASSESSMENT:  No acute issue PLAN:   At rsk asppiration, pcxr NOT c/w aspiration ABX per neuro for drain  ENDOCRINE  ASSESSMENT:   Hypothroid PLAN:   -iv synthroid -cbg  NEUROLOGIC Left SDH with right hemiparesis. Dementia ASSESSMENT:   AMS PLAN:   -per NS Drain in  place Good clinical status  CLINICAL SUMMARY:  89 with sdh on coumadin , reversed  INR and drain placed, improved clinical status   Brett Canales Minor ACNP [facility] Pager [phone] till 3 pm If no answer page [phone] 06/24/2012, 9:06 AM  I have fully examined this patient and agree with above findings.     Mcarthur Rossetti. Tyson Alias, MD, FACP Pgr: [phone] [facility]

## 2012-06-24 NOTE — Progress Notes (Signed)
Subjective: Patient reports Patient feels fair feels some hunger. Denies any significant pain. Moves all 4 extremities quite well with no evidence of a drift  Objective: Vital signs in last 24 hours: Temp:  [97.8 F (36.6 C)-98.7 F (37.1 C)] 98.7 F (37.1 C) (01/05 0800) Pulse Rate:  [68-95] 85  (01/05 0800) Resp:  [11-21] 17  (01/05 0800) BP: (110-193)/(65-113) 144/74 mmHg (01/05 0800) SpO2:  [70 %-99 %] 90 % (01/05 0800) Weight:  [81.2 kg (179 lb 0.2 oz)-82.4 kg (181 lb 10.5 oz)] 82.4 kg (181 lb 10.5 oz) (01/05 0600)  Intake/Output from previous day: 01/04 0701 - 01/05 0700 In: 972.5 [I.V.:722.5; IV Piggyback:250] Out: 1338 [Urine:890; Drains:448] Intake/Output this shift: Total I/O In: 50 [I.V.:50] Out: 150 [Urine:150]  Incision is clean and dry drain is in place. Moderate output. Motor function appears intact with no evidence of cortical drift moving upper and lower extremities well.  Lab Results:  [facility] 06/24/12 0603 06/23/12 1644  WBC 9.2 7.6  HGB 12.2* 11.3*  HCT 35.0* 34.1*  PLT 139* 148*   BMET  Basename 06/24/12 0603 06/23/12 1644  NA 138 139  K 5.0 3.4*  CL 102 100  CO2 27 28  GLUCOSE 103* 94  BUN 21 21  CREATININE 0.97 0.98  CALCIUM 9.0 9.7    Studies/Results: Dg Chest 2 View  06/23/2012  *RADIOLOGY REPORT*  Clinical Data: Patient found unresponsive earlier today, and found on the earlier imaging to have a large left sided subdural hematoma.  Preoperative respiratory evaluation.  CHEST - 2 VIEW  Comparison: Two-view chest x-ray 01/09/2012, 07/23/2010, 07/17/2008.  Findings: Cardiac silhouette enlarged but stable.  Thoracic aorta tortuous and atherosclerotic, unchanged.  Hilar and mediastinal contours otherwise unremarkable.  Mild diffuse interstitial pulmonary edema, new since the prior examinations.  No pleural effusions.  No confluent airspace consolidation.  Visualized bony thorax intact with slight scoliosis convex right.  IMPRESSION: Mild CHF  and/or fluid overload, with stable cardiomegaly and mild diffuse interstitial pulmonary edema.   Original Report Authenticated By: Hulan Saas, M.D.    Ct Head Wo Contrast  06/24/2012  *RADIOLOGY REPORT*  Clinical Data: Followup subdural hematoma  CT HEAD WITHOUT CONTRAST  Technique:  Contiguous axial images were obtained from the base of the skull through the vertex without contrast.  Comparison: 06/23/2012  Findings: Interval placement of a left subdural drain. Significant decrease in size of left frontoparietal subdural hematoma since previous exam, now up to 12 mm thick, previously 24 mm. Small amount of high attenuation acute hemorrhage within the subdural space. Significantly decreased mass effect upon the left hemisphere. Decreased left-to-right midline shift, currently 8 mm, previously 18 mm. Scattered artifacts. No intraparenchymal hemorrhage or definite evidence of infarction. Small vessel chronic ischemic changes of deep cerebral white matter. Artifacts prevent exclusion of a minimal right subdural collection. Posterior fossa unremarkable. Bones and sinuses unremarkable.  IMPRESSION: Significant decrease in size of left frontoparietal subdural hematoma post drainage catheter placement. Small amount of new high attenuation hemorrhage is seen within the left subdural space. Decreased mass effect upon left hemisphere and decreased left-to- right midline shift.   Original Report Authenticated By: Ulyses Southward, M.D.    Ct Head Wo Contrast  06/23/2012  *RADIOLOGY REPORT*  Clinical Data: Altered mental status.  Found on floor.  CT HEAD WITHOUT CONTRAST  Technique:  Contiguous axial images were obtained from the base of the skull through the vertex without contrast.  Comparison: None.  Findings: Large left-sided subdural hematoma measuring approximately 2.1  cm in thickness.  This has low density fluid anteriorly and layering high density blood posteriorly.  There is significant mass effect on the left  cerebral hemisphere.  There is extensive midline shift measuring approximately 18 mm.  There is compression of the left lateral ventricle.  No hydrocephalus.  No acute infarct.  Negative for skull fracture.  IMPRESSION: Large mixed density left sided subdural hematoma with 18 mm midline shift.  Critical Value/emergent results were called by telephone at the time of interpretation on 06/23/2012 at 1440 hours to Dr. Ignacia Palma, who verbally acknowledged these results.   Original Report Authenticated By: Janeece Riggers, M.D.    Portable Chest Xray In Am  06/24/2012  *RADIOLOGY REPORT*  Clinical Data: Planned intubation, subdural hematoma  PORTABLE CHEST - 1 VIEW  Comparison: 06/23/2012  Findings: New patchy right upper lobe airspace opacity is noted. Increased left midlung zone linear presumed atelectasis.  Moderate enlargement of the cardiomediastinal silhouette without edema.  IMPRESSION: New patchy right upper lobe airspace opacity, which could represent aspiration or atelectasis, less likely asymmetric edema.  Increased linear left mid lung zone presumed atelectasis.   Original Report Authenticated By: Christiana Pellant, M.D.     Assessment/Plan: Market improvement status post drainage of large left frontal chronic subdural hematoma.  LOS: 1 day  Mobilized slightly and start sips and chips.   Darcus Edds J 06/24/2012, 9:19 AM

## 2012-06-25 ENCOUNTER — Inpatient Hospital Stay (HOSPITAL_COMMUNITY): Payer: Medicare Other

## 2012-06-25 DIAGNOSIS — D689 Coagulation defect, unspecified: Secondary | ICD-10-CM

## 2012-06-25 DIAGNOSIS — I10 Essential (primary) hypertension: Secondary | ICD-10-CM

## 2012-06-25 LAB — CBC
HCT: 33.2 % — ABNORMAL LOW (ref 39.0–52.0)
Hemoglobin: 11.2 g/dL — ABNORMAL LOW (ref 13.0–17.0)
MCH: 33.1 pg (ref 26.0–34.0)
MCHC: 33.7 g/dL (ref 30.0–36.0)
MCV: 98.2 fL (ref 78.0–100.0)

## 2012-06-25 LAB — GLUCOSE, CAPILLARY
Glucose-Capillary: 102 mg/dL — ABNORMAL HIGH (ref 70–99)
Glucose-Capillary: 121 mg/dL — ABNORMAL HIGH (ref 70–99)
Glucose-Capillary: 136 mg/dL — ABNORMAL HIGH (ref 70–99)
Glucose-Capillary: 81 mg/dL (ref 70–99)

## 2012-06-25 LAB — BASIC METABOLIC PANEL
BUN: 22 mg/dL (ref 6–23)
Creatinine, Ser: 1.02 mg/dL (ref 0.50–1.35)
GFR calc non Af Amer: 63 mL/min — ABNORMAL LOW (ref >90)
Glucose, Bld: 91 mg/dL (ref 70–99)
Potassium: 3.4 mEq/L — ABNORMAL LOW (ref 3.5–5.1)

## 2012-06-25 LAB — PROTIME-INR: Prothrombin Time: 15.7 seconds — ABNORMAL HIGH (ref 11.6–15.2)

## 2012-06-25 MED ORDER — POTASSIUM CHLORIDE CRYS ER 20 MEQ PO TBCR
40.0000 meq | EXTENDED_RELEASE_TABLET | Freq: Once | ORAL | Status: AC
  Administered 2012-06-25: 40 meq via ORAL
  Filled 2012-06-25: qty 2

## 2012-06-25 NOTE — Progress Notes (Signed)
Speech Pathology Note  Pt not seen yesterday, planned to see today. RN called to report MD cancelled orders and started on diet. Will sign off. Please reorder if dysphagia observed. Thanks. Harlon Ditty, MA CCC-SLP [phone]

## 2012-06-25 NOTE — Progress Notes (Signed)
PULMONARY  / CRITICAL CARE MEDICINE  Name: [name] MRN: [id number] DOB: April 25, 1920    LOS: 2  REFERRING MD :  EDP  CHIEF COMPLAINT:  AMS with left SDH  LINES / TUBES: Drain head 1/4>>> 1/6  CULTURES:  ANTIBIOTICS:  SIGNIFICANT EVENTS:  1-4 sdh with plan to go to or. Reversal on going 1/5- improved neurostatus  LEVEL OF CARE:  icu PRIMARY SERVICE:  p[facility] CONSULTANTS:  NS CODE STATUS DIET:  NPO DVT Px:  PAS GI Px:  PPI  INTERVAL HISTORY:  Improving MS, RUE fxn IV drain removed this am 1/6  VITAL SIGNS: Temp:  [98 F (36.7 C)-98.5 F (36.9 C)] 98.3 F (36.8 C) (01/06 0853) Pulse Rate:  [58-134] 58  (01/06 1100) Resp:  [16-27] 19  (01/06 1100) BP: (140-178)/(77-124) 168/90 mmHg (01/06 1100) SpO2:  [90 %-97 %] 95 % (01/06 1100) HEMODYNAMICS:   VENTILATOR SETTINGS:   INTAKE / OUTPUT: Intake/Output      01/05 0701 - 01/06 0700 01/06 0701 - 01/07 0700   I.V. (mL/kg) 1150 (14) 200 (2.4)   IV Piggyback 50    Total Intake(mL/kg) 1200 (14.6) 200 (2.4)   Urine (mL/kg/hr) 945 (0.5) 200 (0.5)   Drains 0 0   Stool 1 2   Total Output 946 202   Net +254 -2          PHYSICAL EXAMINATION: General:  Elderly man, comfortable Neuro:  Rt side weakness. Very talkative but still some confusion HEENT:  No LAN, JVD Cardiovascular:  Regular, no M Lungs:  diminished in bases Abdomen:  +bs Musculoskeletal:  intact Skin:  Warm mild lower ext edema   LABS: Cbc  Lab 06/25/12 0528 06/24/12 0603 06/23/12 1644  WBC 9.5 -- --  HGB 11.2* 12.2* 11.3*  HCT 33.2* 35.0* 34.1*  PLT 125* 139* 148*    Chemistry   Lab 06/25/12 0528 06/24/12 0603 06/23/12 1644  NA 137 138 139  K 3.4* 5.0 3.4*  CL 101 102 100  CO2 25 27 28   BUN 22 21 21   CREATININE 1.02 0.97 0.98  CALCIUM 9.2 9.0 9.7  MG 1.6 -- 1.6  PHOS 3.0 -- 2.9  GLUCOSE 91 103* 94    Liver fxn  Lab 06/23/12 1644 06/23/12 1235  AST 23 22  ALT 13 13  ALKPHOS 58 58  BILITOT 2.2* 1.9*  PROT 6.5 6.5    ALBUMIN 3.3* 3.4*   coags  Lab 06/25/12 0528 06/24/12 1050 06/24/12 0603 06/23/12 1644  APTT 36 -- -- 30  INR 1.28 1.27 1.20 --   Sepsis markers No results found for this basename: LATICACIDVEN:3,PROCALCITON:3 in the last 168 hours Cardiac markers No results found for this basename: CKTOTAL:3,CKMB:3,TROPONINI:3 in the last 168 hours BNP No results found for this basename: PROBNP:3 in the last 168 hours ABG  Lab 06/24/12 0402  PHART 7.406  PCO2ART 45.4*  PO2ART 87.7  HCO3 27.9*  TCO2 29.3    CBG trend  Lab 06/25/12 0851 06/25/12 0431 06/24/12 2353 06/24/12 1924 06/24/12 1617  GLUCAP 83 92 94 90 84    IMAGING: Dg Chest 2 View  06/23/2012  *RADIOLOGY REPORT*  Clinical Data: Patient found unresponsive earlier today, and found on the earlier imaging to have a large left sided subdural hematoma.  Preoperative respiratory evaluation.  CHEST - 2 VIEW  Comparison: Two-view chest x-ray 01/09/2012, 07/23/2010, 07/17/2008.  Findings: Cardiac silhouette enlarged but stable.  Thoracic aorta tortuous and atherosclerotic, unchanged.  Hilar and mediastinal contours otherwise  unremarkable.  Mild diffuse interstitial pulmonary edema, new since the prior examinations.  No pleural effusions.  No confluent airspace consolidation.  Visualized bony thorax intact with slight scoliosis convex right.  IMPRESSION: Mild CHF and/or fluid overload, with stable cardiomegaly and mild diffuse interstitial pulmonary edema.   Original Report Authenticated By: Hulan Saas, M.D.    Ct Head Wo Contrast  06/24/2012  *RADIOLOGY REPORT*  Clinical Data: Followup subdural hematoma  CT HEAD WITHOUT CONTRAST  Technique:  Contiguous axial images were obtained from the base of the skull through the vertex without contrast.  Comparison: 06/23/2012  Findings: Interval placement of a left subdural drain. Significant decrease in size of left frontoparietal subdural hematoma since previous exam, now up to 12 mm thick, previously  24 mm. Small amount of high attenuation acute hemorrhage within the subdural space. Significantly decreased mass effect upon the left hemisphere. Decreased left-to-right midline shift, currently 8 mm, previously 18 mm. Scattered artifacts. No intraparenchymal hemorrhage or definite evidence of infarction. Small vessel chronic ischemic changes of deep cerebral white matter. Artifacts prevent exclusion of a minimal right subdural collection. Posterior fossa unremarkable. Bones and sinuses unremarkable.  IMPRESSION: Significant decrease in size of left frontoparietal subdural hematoma post drainage catheter placement. Small amount of new high attenuation hemorrhage is seen within the left subdural space. Decreased mass effect upon left hemisphere and decreased left-to- right midline shift.   Original Report Authenticated By: Ulyses Southward, M.D.    Ct Head Wo Contrast  06/23/2012  *RADIOLOGY REPORT*  Clinical Data: Altered mental status.  Found on floor.  CT HEAD WITHOUT CONTRAST  Technique:  Contiguous axial images were obtained from the base of the skull through the vertex without contrast.  Comparison: None.  Findings: Large left-sided subdural hematoma measuring approximately 2.1 cm in thickness.  This has low density fluid anteriorly and layering high density blood posteriorly.  There is significant mass effect on the left cerebral hemisphere.  There is extensive midline shift measuring approximately 18 mm.  There is compression of the left lateral ventricle.  No hydrocephalus.  No acute infarct.  Negative for skull fracture.  IMPRESSION: Large mixed density left sided subdural hematoma with 18 mm midline shift.  Critical Value/emergent results were called by telephone at the time of interpretation on 06/23/2012 at 1440 hours to Dr. Ignacia Palma, who verbally acknowledged these results.   Original Report Authenticated By: Janeece Riggers, M.D.    Dg Chest Port 1 View  06/25/2012  *RADIOLOGY REPORT*  Clinical Data:  Aspiration  PORTABLE CHEST - 1 VIEW  Comparison: Yesterday  Findings: Thorax markedly rotated to the right limiting the exam. Right upper lobe consolidation is difficult to visualize.  Left lung is grossly clear.  Cardiomegaly.  No pneumothorax.  IMPRESSION: Limited exam.  Right upper lobe consolidation is difficult to assess.  Left lung is grossly clear.   Original Report Authenticated By: Jolaine Click, M.D.    Portable Chest Xray In Am  06/24/2012  *RADIOLOGY REPORT*  Clinical Data: Planned intubation, subdural hematoma  PORTABLE CHEST - 1 VIEW  Comparison: 06/23/2012  Findings: New patchy right upper lobe airspace opacity is noted. Increased left midlung zone linear presumed atelectasis.  Moderate enlargement of the cardiomediastinal silhouette without edema.  IMPRESSION: New patchy right upper lobe airspace opacity, which could represent aspiration or atelectasis, less likely asymmetric edema.  Increased linear left mid lung zone presumed atelectasis.   Original Report Authenticated By: Christiana Pellant, M.D.     ECG: afib with vr 74  DIAGNOSES: Active Problems:  SDH (subdural hematoma)  Dementia  PVD (peripheral vascular disease)  BPH (benign prostatic hyperplasia)  Thyroid activity decreased  HTN (hypertension)  Atrial fibrillation, persistent    ASSESSMENT / PLAN:  PULMONARY  ASSESSMENT: High risk post op resp failure, int edema PLAN:   - follow clinically, follow CXR   CARDIOVASCULAR AFIB PVD post left CEA HTN in setting SDH ASSESSMENT: PLAN:  -No further coumadin, reversal given -goal SBP 140-160, hydralazine on order -likely can control BP further in am  -tele -continue neg balance, see pcxr  RENAL Lab Results  Component Value Date   CREATININE 1.02 06/25/2012   CREATININE 0.97 06/24/2012   CREATININE 0.98 06/23/2012    ASSESSMENT:  No acute issue PLAN:   Avoid free water Follow BMP Low threshold lasix  GASTROINTESTINAL  ASSESSMENT:  At risk dysphagia PLAN:     Start diet 1/6 ppi  HEMATOLOGIC coagulopathic  ASSESSMENT:  High risk gastric ulcer and dvt (after correction) INR 2 PLAN:  -ppi -follow CBC -scd  INFECTIOUS  ASSESSMENT:  No acute issue PLAN:   At rsk asppiration, pcxr NOT c/w aspiration ABX per neuro for drain; ? D/c ceftriaxone 1/7 now that drain out 1/6  ENDOCRINE  ASSESSMENT:   Hypothroid PLAN:   -iv synthroid -cbg  NEUROLOGIC Left SDH with right hemiparesis. Dementia ASSESSMENT:   AMS PLAN:   -per NS; drain removed  CLINICAL SUMMARY:  89 with sdh on coumadin , reversed  INR and drain placed, improved clinical status  Levy Pupa, MD, PhD 06/25/2012, 11:33 AM [facility] [phone] or if no answer [phone]

## 2012-06-25 NOTE — Progress Notes (Signed)
Subjective: Patient reports Days doing much better significant improvement and headache and right upper extremity function  Objective: Vital signs in last 24 hours: Temp:  [98 F (36.7 C)-98.7 F (37.1 C)] 98.3 F (36.8 C) (01/06 0400) Pulse Rate:  [75-134] 96  (01/06 0700) Resp:  [11-27] 24  (01/06 0700) BP: (118-178)/(74-124) 165/85 mmHg (01/06 0700) SpO2:  [90 %-97 %] 93 % (01/06 0700)  Intake/Output from previous day: 01/05 0701 - 01/06 0700 In: 1200 [I.V.:1150; IV Piggyback:50] Out: 836 [Urine:835; Stool:1] Intake/Output this shift:    Awake alert oriented strength 5 out of 5 no pronator drift  Lab Results:  [facility] 06/25/12 0528 06/24/12 0603  WBC 9.5 9.2  HGB 11.2* 12.2*  HCT 33.2* 35.0*  PLT 125* 139*   BMET  Basename 06/25/12 0528 06/24/12 0603  NA 137 138  K 3.4* 5.0  CL 101 102  CO2 25 27  GLUCOSE 91 103*  BUN 22 21  CREATININE 1.02 0.97  CALCIUM 9.2 9.0    Studies/Results: Dg Chest 2 View  06/23/2012  *RADIOLOGY REPORT*  Clinical Data: Patient found unresponsive earlier today, and found on the earlier imaging to have a large left sided subdural hematoma.  Preoperative respiratory evaluation.  CHEST - 2 VIEW  Comparison: Two-view chest x-ray 01/09/2012, 07/23/2010, 07/17/2008.  Findings: Cardiac silhouette enlarged but stable.  Thoracic aorta tortuous and atherosclerotic, unchanged.  Hilar and mediastinal contours otherwise unremarkable.  Mild diffuse interstitial pulmonary edema, new since the prior examinations.  No pleural effusions.  No confluent airspace consolidation.  Visualized bony thorax intact with slight scoliosis convex right.  IMPRESSION: Mild CHF and/or fluid overload, with stable cardiomegaly and mild diffuse interstitial pulmonary edema.   Original Report Authenticated By: Hulan Saas, M.D.    Ct Head Wo Contrast  06/24/2012  *RADIOLOGY REPORT*  Clinical Data: Followup subdural hematoma  CT HEAD WITHOUT CONTRAST  Technique:  Contiguous  axial images were obtained from the base of the skull through the vertex without contrast.  Comparison: 06/23/2012  Findings: Interval placement of a left subdural drain. Significant decrease in size of left frontoparietal subdural hematoma since previous exam, now up to 12 mm thick, previously 24 mm. Small amount of high attenuation acute hemorrhage within the subdural space. Significantly decreased mass effect upon the left hemisphere. Decreased left-to-right midline shift, currently 8 mm, previously 18 mm. Scattered artifacts. No intraparenchymal hemorrhage or definite evidence of infarction. Small vessel chronic ischemic changes of deep cerebral white matter. Artifacts prevent exclusion of a minimal right subdural collection. Posterior fossa unremarkable. Bones and sinuses unremarkable.  IMPRESSION: Significant decrease in size of left frontoparietal subdural hematoma post drainage catheter placement. Small amount of new high attenuation hemorrhage is seen within the left subdural space. Decreased mass effect upon left hemisphere and decreased left-to- right midline shift.   Original Report Authenticated By: Ulyses Southward, M.D.    Ct Head Wo Contrast  06/23/2012  *RADIOLOGY REPORT*  Clinical Data: Altered mental status.  Found on floor.  CT HEAD WITHOUT CONTRAST  Technique:  Contiguous axial images were obtained from the base of the skull through the vertex without contrast.  Comparison: None.  Findings: Large left-sided subdural hematoma measuring approximately 2.1 cm in thickness.  This has low density fluid anteriorly and layering high density blood posteriorly.  There is significant mass effect on the left cerebral hemisphere.  There is extensive midline shift measuring approximately 18 mm.  There is compression of the left lateral ventricle.  No hydrocephalus.  No acute  infarct.  Negative for skull fracture.  IMPRESSION: Large mixed density left sided subdural hematoma with 18 mm midline shift.  Critical  Value/emergent results were called by telephone at the time of interpretation on 06/23/2012 at 1440 hours to Dr. Ignacia Palma, who verbally acknowledged these results.   Original Report Authenticated By: Janeece Riggers, M.D.    Dg Chest Port 1 View  06/25/2012  *RADIOLOGY REPORT*  Clinical Data: Aspiration  PORTABLE CHEST - 1 VIEW  Comparison: Yesterday  Findings: Thorax markedly rotated to the right limiting the exam. Right upper lobe consolidation is difficult to visualize.  Left lung is grossly clear.  Cardiomegaly.  No pneumothorax.  IMPRESSION: Limited exam.  Right upper lobe consolidation is difficult to assess.  Left lung is grossly clear.   Original Report Authenticated By: Jolaine Click, M.D.    Portable Chest Xray In Am  06/24/2012  *RADIOLOGY REPORT*  Clinical Data: Planned intubation, subdural hematoma  PORTABLE CHEST - 1 VIEW  Comparison: 06/23/2012  Findings: New patchy right upper lobe airspace opacity is noted. Increased left midlung zone linear presumed atelectasis.  Moderate enlargement of the cardiomediastinal silhouette without edema.  IMPRESSION: New patchy right upper lobe airspace opacity, which could represent aspiration or atelectasis, less likely asymmetric edema.  Increased linear left mid lung zone presumed atelectasis.   Original Report Authenticated By: Christiana Pellant, M.D.     Assessment/Plan: CAT scan displayed significant interval improvement the subdural drainage residual fluid is consistent with cortical atrophy of subdural CSF/hygroma presence. I have DC'd his subdural drain we'll progressively mobilize with physical therapy  LOS: 2 days     Carlissa Pesola P 06/25/2012, 7:58 AM

## 2012-06-26 DIAGNOSIS — W19XXXA Unspecified fall, initial encounter: Secondary | ICD-10-CM

## 2012-06-26 DIAGNOSIS — E039 Hypothyroidism, unspecified: Secondary | ICD-10-CM

## 2012-06-26 DIAGNOSIS — S065X9A Traumatic subdural hemorrhage with loss of consciousness of unspecified duration, initial encounter: Secondary | ICD-10-CM

## 2012-06-26 LAB — CBC
HCT: 31.2 % — ABNORMAL LOW (ref 39.0–52.0)
Hemoglobin: 10.7 g/dL — ABNORMAL LOW (ref 13.0–17.0)
MCV: 97.2 fL (ref 78.0–100.0)
RBC: 3.21 MIL/uL — ABNORMAL LOW (ref 4.22–5.81)
WBC: 9.5 10*3/uL (ref 4.0–10.5)

## 2012-06-26 LAB — BASIC METABOLIC PANEL
CO2: 26 mEq/L (ref 19–32)
Calcium: 8.7 mg/dL (ref 8.4–10.5)
GFR calc non Af Amer: 70 mL/min — ABNORMAL LOW (ref >90)
Sodium: 138 mEq/L (ref 135–145)

## 2012-06-26 LAB — GLUCOSE, CAPILLARY: Glucose-Capillary: 109 mg/dL — ABNORMAL HIGH (ref 70–99)

## 2012-06-26 LAB — PHOSPHORUS: Phosphorus: 2.4 mg/dL (ref 2.3–4.6)

## 2012-06-26 LAB — PROTIME-INR: INR: 1.37 (ref 0.00–1.49)

## 2012-06-26 MED ORDER — METOPROLOL TARTRATE 25 MG PO TABS
25.0000 mg | ORAL_TABLET | Freq: Two times a day (BID) | ORAL | Status: DC
  Administered 2012-06-26 – 2012-06-27 (×2): 25 mg via ORAL
  Filled 2012-06-26 (×3): qty 1

## 2012-06-26 MED ORDER — SIMVASTATIN 20 MG PO TABS
20.0000 mg | ORAL_TABLET | Freq: Every evening | ORAL | Status: DC
  Administered 2012-06-26: 20 mg via ORAL
  Filled 2012-06-26 (×2): qty 1

## 2012-06-26 MED ORDER — HYDROCHLOROTHIAZIDE 25 MG PO TABS
12.5000 mg | ORAL_TABLET | Freq: Every day | ORAL | Status: DC
  Administered 2012-06-27: 12.5 mg via ORAL
  Filled 2012-06-26: qty 0.5

## 2012-06-26 MED ORDER — LEVOTHYROXINE SODIUM 50 MCG PO TABS
50.0000 ug | ORAL_TABLET | Freq: Every day | ORAL | Status: DC
  Administered 2012-06-27: 50 ug via ORAL
  Filled 2012-06-26 (×2): qty 1

## 2012-06-26 MED ORDER — DONEPEZIL HCL 5 MG PO TABS
5.0000 mg | ORAL_TABLET | Freq: Every day | ORAL | Status: DC
  Administered 2012-06-27: 5 mg via ORAL
  Filled 2012-06-26: qty 1

## 2012-06-26 MED ORDER — DOXAZOSIN MESYLATE 2 MG PO TABS
2.0000 mg | ORAL_TABLET | Freq: Every day | ORAL | Status: DC
  Administered 2012-06-26: 2 mg via ORAL
  Filled 2012-06-26 (×2): qty 1

## 2012-06-26 MED ORDER — FINASTERIDE 5 MG PO TABS
5.0000 mg | ORAL_TABLET | Freq: Every day | ORAL | Status: DC
  Administered 2012-06-26: 5 mg via ORAL
  Filled 2012-06-26 (×2): qty 1

## 2012-06-26 NOTE — Evaluation (Signed)
Physical Therapy Evaluation Patient Details Name: [name] MRN: [id number] DOB: 1920/03/10 Today's Date: 06/26/2012 Time: 4098-1191 PT Time Calculation (min): 40 min  PT Assessment / Plan / Recommendation Clinical Impression  Pt is 79 y/o male admitted for noticeable right sided weakness.  Pt diagnosised with left SDH with s/p evacuation.  Pt with hisotry of mild dementia however noticeable increase problem sovling impairments as well as increase cues for safety.  Pt will benefit from acute PT services to improve balance, coordination, strength, gait and overall mobility prior to d/c to next venue.  Highly recommend inpatient rehab at this time.    PT Assessment  Patient needs continued PT services    Follow Up Recommendations  CIR    Barriers to Discharge None      Equipment Recommendations  Rolling walker with 5" wheels    Recommendations for Other Services Rehab consult   Frequency Min 4X/week    Precautions / Restrictions Precautions Precautions: Fall Restrictions Weight Bearing Restrictions: No   Pertinent Vitals/Pain C/o mild bilateral feet pain at arch; prior arch pain      Mobility  Bed Mobility Bed Mobility: Right Sidelying to Sit;Sitting - Scoot to Edge of Bed Right Sidelying to Sit: 3: Mod assist;With rails;HOB flat Sitting - Scoot to Edge of Bed: 4: Min assist Details for Bed Mobility Assistance: (A) to elevate trunk OOB with max cues for technique.  (A) for proper weight shift to advance hips to EOB. Transfers Transfers: Sit to Stand;Stand to Sit Sit to Stand: 2: Max assist;From bed;From toilet Stand to Sit: 2: Max assist;To toilet;To chair/3-in-1 Details for Transfer Assistance: (A) to initiate transfer and slowly descend to toliet/recliner with max cues for hand placement.  Pt with slow problem solving and motor planning for body position and hand placement.   Ambulation/Gait Ambulation/Gait Assistance: 1: +2 Total assist Ambulation/Gait: Patient  Percentage: 70% Ambulation Distance (Feet): 25 Feet Assistive device: Rolling walker;2 person hand held assist (10' 2 person HHA) Ambulation/Gait Assistance Details: 34' 2 person HHA; 22' with RW; Pt needs +2 (A) for safety and lines with max cues for body position within RW and RW placement.  Pt tends to ambulate with RW too far in front.  Pt needs (A) to manage RW. Gait Pattern: Step-through pattern;Decreased stride length;Shuffle;Trunk flexed Gait velocity: decreased General Gait Details: Pt with moderate forward flexion and needs constant cues for upright posture. Stairs: No Wheelchair Mobility Wheelchair Mobility: No Modified Rankin (Stroke Patients Only) Pre-Morbid Rankin Score: No symptoms Modified Rankin: Severe disability     PT Diagnosis: Difficulty walking;Generalized weakness  PT Problem List: Decreased strength;Decreased activity tolerance;Decreased balance;Decreased mobility;Decreased coordination;Decreased knowledge of use of DME;Decreased safety awareness;Decreased knowledge of precautions PT Treatment Interventions: DME instruction;Gait training;Functional mobility training;Therapeutic activities;Therapeutic exercise;Balance training;Neuromuscular re-education;Cognitive remediation;Patient/family education   PT Goals Acute Rehab PT Goals PT Goal Formulation: With patient/family Time For Goal Achievement: 07/10/12 Potential to Achieve Goals: Good Pt will go Supine/Side to Sit: with modified independence PT Goal: Supine/Side to Sit - Progress: Goal set today Pt will go Sit to Supine/Side: with modified independence PT Goal: Sit to Supine/Side - Progress: Goal set today Pt will go Sit to Stand: with supervision PT Goal: Sit to Stand - Progress: Goal set today Pt will go Stand to Sit: with supervision PT Goal: Stand to Sit - Progress: Goal set today Pt will Stand: with supervision;1 - 2 min PT Goal: Stand - Progress: Goal set today Pt will Ambulate: >150 feet;with  supervision;with rolling walker PT  Goal: Ambulate - Progress: Goal set today  Visit Information  Last PT Received On: 06/26/12 Assistance Needed: +2 (safety) PT/OT Co-Evaluation/Treatment: Yes    Subjective Data  Subjective: "I didn't realize I was this bad off.  I guess my dancing days are over." Patient Stated Goal: To eventually go home.  Would like to return to ballroom dancing.   Prior Functioning  Home Living Lives With: Spouse Available Help at Discharge: Family Type of Home: House Home Access: Stairs to enter Secretary/administrator of Steps: 6 Entrance Stairs-Rails: Right;Left Home Layout: One level (with basement -> art studio and computer, man cave) Web designer Shower/Tub: Walk-in shower (small step into shower) Dentist: None Prior Function Level of Independence: Independent Able to Take Stairs?: Yes Driving: Yes Vocation: Retired Dominant Hand: Right    Cognition  Overall Cognitive Status: Impaired Area of Impairment: Attention;Safety/judgement;Awareness of deficits;Problem solving Arousal/Alertness: Awake/alert Orientation Level: Appears intact for tasks assessed Behavior During Session: [facility] for tasks performed Current Attention Level: Sustained Attention - Other Comments: Pt easily distracted and continues to repeat himself. Safety/Judgement: Decreased safety judgement for tasks assessed Safety/Judgement - Other Comments: Pt needs cues for safety especially with ambulation. Problem Solving: Pt slow to process and needs extra time to complete task.    Extremity/Trunk Assessment Right Lower Extremity Assessment RLE ROM/Strength/Tone: Deficits RLE ROM/Strength/Tone Deficits: At least 3/5 gross; difficult to fully assess due to cognition` Left Lower Extremity Assessment LLE ROM/Strength/Tone: Deficits;Unable to fully assess LLE ROM/Strength/Tone Deficits: At least 3/5 gross with noticeable knee buckling Trunk  Assessment Trunk Assessment: Normal   Balance Balance Balance Assessed: Yes Static Sitting Balance Static Sitting - Balance Support: Feet supported Static Sitting - Level of Assistance: 5: Stand by assistance Static Standing Balance Static Standing - Balance Support: During functional activity Static Standing - Level of Assistance: 3: Mod assist;4: Min assist Static Standing - Comment/# of Minutes: (A) to maintain balance with occasional knee buckling and forward lean.  End of Session PT - End of Session Equipment Utilized During Treatment: Gait belt Activity Tolerance: Patient limited by fatigue Patient left: in chair;with call bell/phone within reach;with family/visitor present Nurse Communication: Mobility status  GP     Indica Marcott 06/26/2012, 10:28 AM Jake Shark, PT DPT [phone]

## 2012-06-26 NOTE — Consult Note (Signed)
Physical Medicine and Rehabilitation Consult Reason for Consult: Subdural hematoma Referring Physician: Critical care   HPI: [name] is a 79 y.o. right-handed male with history of hypertension, atrial fibrillation with Coumadin therapy as well as mild dementia. Patient independent and active prior to admission. Admitted 06/23/2012 with altered mental status and right-sided weakness. There was a report of a fall a couple weeks ago. X-rays and imaging revealed a large subdural hematoma. INR on admission of 2.5 and reversed. Underwent bedside twist drill placement of subdural drain 06/23/2012 per Dr. Wynetta Emery. Follow cranial CT scan was significant decrease in size of left frontal parietal subdural hematoma. Patient maintained on a regular consistency diet. Patient maintained on ventilator support for a short time followed by critical care medicine. He remains off Coumadin therapy secondary to subdural hematoma. Physical and occupational therapy evaluations completed an ongoing with recommendations of physical medicine rehabilitation consult to consider inpatient rehabilitation services   Review of Systems  Cardiovascular: Positive for palpitations.  Genitourinary: Positive for urgency.  Musculoskeletal: Positive for myalgias and falls.  Psychiatric/Behavioral: Positive for memory loss.  All other systems reviewed and are negative.   Past Medical History  Diagnosis Date  . Dementia   . A-fib   . Enlarged prostate   . Thyroid disease     hypothyroid  . Hypertension    Past Surgical History  Procedure Date  . Eye surgery    No family history on file. Social History:  reports that he has never smoked. He does not have any smokeless tobacco history on file. He reports that he drinks alcohol. He reports that he does not use illicit drugs. Allergies:  Allergies  Allergen Reactions  . Contrast Media (Iodinated Diagnostic Agents)     rash   Medications Prior to Admission  Medication  Sig Dispense Refill  . Cholecalciferol (VITAMIN D PO) Take 1 tablet by mouth every morning.      . donepezil (ARICEPT) 5 MG tablet Take 5 mg by mouth daily at 12 noon.      Marland Kitchen doxazosin (CARDURA) 2 MG tablet Take 2 mg by mouth at bedtime.      . finasteride (PROSCAR) 5 MG tablet Take 5 mg by mouth at bedtime.      . hydrochlorothiazide (HYDRODIURIL) 25 MG tablet Take 12.5 mg by mouth daily.      Marland Kitchen levothyroxine (SYNTHROID, LEVOTHROID) 50 MCG tablet Take 50 mcg by mouth daily at 12 noon.      Marland Kitchen lisinopril (PRINIVIL,ZESTRIL) 10 MG tablet Take 5 mg by mouth daily.      . metoprolol (LOPRESSOR) 50 MG tablet Take 25 mg by mouth 2 (two) times daily.      . simvastatin (ZOCOR) 40 MG tablet Take 20 mg by mouth every evening.        Home: Home Living Lives With: Spouse Available Help at Discharge: Family Type of Home: House Home Access: Stairs to enter Secretary/administrator of Steps: 6 Entrance Stairs-Rails: Right;Left Home Layout: One level Bathroom Shower/Tub: Health visitor: Standard Home Adaptive Equipment: None  Functional History: Prior Function Able to Take Stairs?: Yes Driving: Yes Vocation: Retired Functional Status:  Mobility: Bed Mobility Bed Mobility: Right Sidelying to Sit;Sitting - Scoot to Edge of Bed Right Sidelying to Sit: 3: Mod assist;With rails;HOB flat Sitting - Scoot to Edge of Bed: 4: Min assist Transfers Transfers: Sit to Stand;Stand to Sit Sit to Stand: 2: Max assist;From bed;From toilet Stand to Sit: 2: Max assist;To toilet;To chair/3-in-1 Ambulation/Gait  Ambulation/Gait Assistance: 1: +2 Total assist Ambulation/Gait: Patient Percentage: 70% Ambulation Distance (Feet): 25 Feet Assistive device: Rolling walker;2 person hand held assist (10' 2 person HHA) Ambulation/Gait Assistance Details: 62' 2 person HHA; 33' with RW; Pt needs +2 (A) for safety and lines with max cues for body position within RW and RW placement.  Pt tends to ambulate with  RW too far in front.  Pt needs (A) to manage RW. Gait Pattern: Step-through pattern;Decreased stride length;Shuffle;Trunk flexed Gait velocity: decreased General Gait Details: Pt with moderate forward flexion and needs constant cues for upright posture. Stairs: No Wheelchair Mobility Wheelchair Mobility: No  ADL: ADL Eating/Feeding: Minimal assistance Where Assessed - Eating/Feeding: Bed level (wife (A)ing patient on arrival) Grooming: Teeth care;Moderate assistance Where Assessed - Grooming: Supported standing Lower Body Bathing: Maximal assistance Where Assessed - Lower Body Bathing: Supported standing Upper Body Dressing: Moderate assistance Where Assessed - Upper Body Dressing: Unsupported sitting Lower Body Dressing: +1 Total assistance Where Assessed - Lower Body Dressing: Unsupported sitting Toilet Transfer: Maximal assistance Toilet Transfer Method: Sit to stand Toilet Transfer Equipment: Regular height toilet;Grab bars Equipment Used: Rolling walker;Gait belt Transfers/Ambulation Related to ADLs: Pt requires total +2 pt 70% for safe ambulation with IV pole. pt pushing RW too far in advance and needing max v/c. Pt requires max tactile (A) for positioning RW. ADL Comments: Pt required (A) for bed mobility and ambulation to sink. Pt with unsteady static standing at sink lEvel. Pt needed min v/c to initiate oral care. Pt attempting peri hygiene at sink and needed (A). pt requesting to use restroom at sink level. pt assisted with RW to bathroom. pt voiding bowels. pt with total (A) for peri hygiene due to safety in bathroom space. PT positioned in chair with wife present.  Cognition: Cognition Arousal/Alertness: Awake/alert Orientation Level: Oriented to person;Disoriented to place;Disoriented to time;Disoriented to situation Cognition Overall Cognitive Status: Impaired Area of Impairment: Attention;Safety/judgement;Awareness of deficits;Problem solving Arousal/Alertness:  Awake/alert Orientation Level: Appears intact for tasks assessed Behavior During Session: [facility] for tasks performed Current Attention Level: Sustained Attention - Other Comments: Pt easily distracted and continues to repeat himself. Safety/Judgement: Decreased safety judgement for tasks assessed Safety/Judgement - Other Comments: Pt needs cues for safety especially with ambulation. Problem Solving: Pt slow to process and needs extra time to complete task. Cognition - Other Comments: pt benefits from least distracting environment due to attention span  Blood pressure 151/93, pulse 99, temperature 99 F (37.2 C), temperature source Oral, resp. rate 22, height 6' (1.829 m), weight 83 kg (182 lb 15.7 oz), SpO2 97.00%. Physical Exam  Constitutional: He appears well-developed and well-nourished.       78 year old white male with complaints of being cold.  HENT:  Head: Normocephalic.  Right Ear: External ear normal.  Left Ear: External ear normal.  Eyes: Conjunctivae normal and EOM are normal. Pupils are equal, round, and reactive to light.       Pupils round and reactive to light  Neck: Neck supple. No thyromegaly present.  Cardiovascular: Normal rate, regular rhythm and normal heart sounds.  Exam reveals no friction rub.   No murmur heard.      Cardiac rate is controlled  Pulmonary/Chest: Effort normal and breath sounds normal. He has no wheezes. He has no rales.  Abdominal: Bowel sounds are normal. He exhibits no distension.  Musculoskeletal: He exhibits no edema.  Neurological: He is alert.       Patient was able name person place as well as his  date of birth. He did need some subtle cues for hospital course.  Delayed processing. Occasionally perseverative, sometimes quite distracted. Patient would follow simple commands. Moves all 4's. Lower extremities seem limited somewhat by pain, especially the RLE. No gross sensory finding.   Skin: Skin is warm and dry.       Abrasion near the right  knee    Results for orders placed during the hospital encounter of 06/23/12 (from the past 24 hour(s))  GLUCOSE, CAPILLARY     Status: Abnormal   Collection Time   06/25/12  4:01 PM      Component Value Range   Glucose-Capillary 136 (*) 70 - 99 mg/dL   Comment 1 Notify RN     Comment 2 Documented in Chart    GLUCOSE, CAPILLARY     Status: Abnormal   Collection Time   06/25/12  7:54 PM      Component Value Range   Glucose-Capillary 121 (*) 70 - 99 mg/dL   Comment 1 Notify RN     Comment 2 Documented in Chart    GLUCOSE, CAPILLARY     Status: Abnormal   Collection Time   06/25/12 11:37 PM      Component Value Range   Glucose-Capillary 102 (*) 70 - 99 mg/dL  GLUCOSE, CAPILLARY     Status: Abnormal   Collection Time   06/26/12  3:38 AM      Component Value Range   Glucose-Capillary 118 (*) 70 - 99 mg/dL  BASIC METABOLIC PANEL     Status: Abnormal   Collection Time   06/26/12  4:40 AM      Component Value Range   Sodium 138  135 - 145 mEq/L   Potassium 3.8  3.5 - 5.1 mEq/L   Chloride 102  96 - 112 mEq/L   CO2 26  19 - 32 mEq/L   Glucose, Bld 122 (*) 70 - 99 mg/dL   BUN 23  6 - 23 mg/dL   Creatinine, Ser 1.61  0.50 - 1.35 mg/dL   Calcium 8.7  8.4 - 09.6 mg/dL   GFR calc non Af Amer 70 (*) >90 mL/min   GFR calc Af Amer 81 (*) >90 mL/min  MAGNESIUM     Status: Normal   Collection Time   06/26/12  4:40 AM      Component Value Range   Magnesium 1.7  1.5 - 2.5 mg/dL  PHOSPHORUS     Status: Normal   Collection Time   06/26/12  4:40 AM      Component Value Range   Phosphorus 2.4  2.3 - 4.6 mg/dL  CBC     Status: Abnormal   Collection Time   06/26/12  4:40 AM      Component Value Range   WBC 9.5  4.0 - 10.5 K/uL   RBC 3.21 (*) 4.22 - 5.81 MIL/uL   Hemoglobin 10.7 (*) 13.0 - 17.0 g/dL   HCT 04.5 (*) 40.9 - 81.1 %   MCV 97.2  78.0 - 100.0 fL   MCH 33.3  26.0 - 34.0 pg   MCHC 34.3  30.0 - 36.0 g/dL   RDW 91.4  78.2 - 95.6 %   Platelets 121 (*) 150 - 400 K/uL  PROTIME-INR     Status:  Abnormal   Collection Time   06/26/12  4:40 AM      Component Value Range   Prothrombin Time 16.5 (*) 11.6 - 15.2 seconds   INR 1.37  0.00 -  1.49  GLUCOSE, CAPILLARY     Status: Abnormal   Collection Time   06/26/12  8:15 AM      Component Value Range   Glucose-Capillary 118 (*) 70 - 99 mg/dL   Dg Chest Port 1 View  06/25/2012  *RADIOLOGY REPORT*  Clinical Data: Aspiration  PORTABLE CHEST - 1 VIEW  Comparison: Yesterday  Findings: Thorax markedly rotated to the right limiting the exam. Right upper lobe consolidation is difficult to visualize.  Left lung is grossly clear.  Cardiomegaly.  No pneumothorax.  IMPRESSION: Limited exam.  Right upper lobe consolidation is difficult to assess.  Left lung is grossly clear.   Original Report Authenticated By: Jolaine Click, M.D.     Assessment/Plan: Diagnosis: left fronto-parietal SDH 1. Does the need for close, 24 hr/day medical supervision in concert with the patient's rehab needs make it unreasonable for this patient to be served in a less intensive setting? Yes 2. Co-Morbidities requiring supervision/potential complications: pvd, dementia 3. Due to bladder management, bowel management, safety, skin/wound care, disease management, medication administration, pain management and patient education, does the patient require 24 hr/day rehab nursing? Yes 4. Does the patient require coordinated care of a physician, rehab nurse, PT (1-2 hrs/day, 5 days/week), OT (1-2 hrs/day, 5 days/week) and SLP (1-2 hrs/day, 5 days/week) to address physical and functional deficits in the context of the above medical diagnosis(es)? Yes Addressing deficits in the following areas: balance, endurance, locomotion, strength, transferring, bowel/bladder control, bathing, dressing, feeding, grooming, toileting, cognition, speech, language and psychosocial support 5. Can the patient actively participate in an intensive therapy program of at least 3 hrs of therapy per day at least 5 days  per week? Yes 6. The potential for patient to make measurable gains while on inpatient rehab is good 7. Anticipated functional outcomes upon discharge from inpatient rehab are supervision to min assist with PT, supervision to min assist with OT, min assist with SLP. 8. Estimated rehab length of stay to reach the above functional goals is: 2 weeks+ 9. Does the patient have adequate social supports to accommodate these discharge functional goals? Yes 10. Anticipated D/C setting: Home 11. Anticipated post D/C treatments: HH therapy 12. Overall Rehab/Functional Prognosis: excellent  RECOMMENDATIONS: This patient's condition is appropriate for continued rehabilitative care in the following setting: CIR Patient has agreed to participate in recommended program. Potentially Note that insurance prior authorization may be required for reimbursement for recommended care.  Comment:Rehab RN to follow up.   Ivory Broad, MD     06/26/2012

## 2012-06-26 NOTE — Progress Notes (Signed)
Subjective: Patient reports No complaints no significant headache  Objective: Vital signs in last 24 hours: Temp:  [97.9 F (36.6 C)-100.5 F (38.1 C)] 99 F (37.2 C) (01/07 0400) Pulse Rate:  [58-110] 91  (01/07 0600) Resp:  [17-29] 23  (01/07 0600) BP: (113-178)/(79-122) 140/92 mmHg (01/07 0600) SpO2:  [90 %-97 %] 94 % (01/07 0600) Weight:  [83 kg (182 lb 15.7 oz)] 83 kg (182 lb 15.7 oz) (01/07 0500)  Intake/Output from previous day: 01/06 0701 - 01/07 0700 In: 1210 [I.V.:1150; IV Piggyback:60] Out: 704 [Urine:700; Stool:4] Intake/Output this shift:    Awake alert still confused but oriented to name we'll follow commands x4 no pronator drift  Lab Results:  [facility] 06/26/12 0440 06/25/12 0528  WBC 9.5 9.5  HGB 10.7* 11.2*  HCT 31.2* 33.2*  PLT 121* 125*   BMET  Basename 06/26/12 0440 06/25/12 0528  NA 138 137  K 3.8 3.4*  CL 102 101  CO2 26 25  GLUCOSE 122* 91  BUN 23 22  CREATININE 0.98 1.02  CALCIUM 8.7 9.2    Studies/Results: Dg Chest Port 1 View  06/25/2012  *RADIOLOGY REPORT*  Clinical Data: Aspiration  PORTABLE CHEST - 1 VIEW  Comparison: Yesterday  Findings: Thorax markedly rotated to the right limiting the exam. Right upper lobe consolidation is difficult to visualize.  Left lung is grossly clear.  Cardiomegaly.  No pneumothorax.  IMPRESSION: Limited exam.  Right upper lobe consolidation is difficult to assess.  Left lung is grossly clear.   Original Report Authenticated By: Jolaine Click, M.D.     Assessment/Plan: Postop day 3 left-sided subdural hematoma evacuation doing fairly well continue to mobilize its okay with me for him to be transferred to the floor if he's cleared by critical care and would recommend followup head CT in the morning.  LOS: 3 days     Usha Slager P 06/26/2012, 7:04 AM

## 2012-06-26 NOTE — Evaluation (Signed)
Occupational Therapy Evaluation Patient Details Name: [name] MRN: [id number] DOB: 30-Dec-1919 Today's Date: 06/26/2012 Time: 1308-6578 OT Time Calculation (min): 40 min  OT Assessment / Plan / Recommendation Clinical Impression  79 yo male diagnosised with left SDH with s/p evacuation.Pt currently with balance deficits and decreased activity tolerance. OT to follow acutely. Pt with PTA dementia however was very high functioning per family. pt was driving and cutting wood with chain saw ~1 week ago. Recommend CIR for d/c planning    OT Assessment  Patient needs continued OT Services    Follow Up Recommendations  CIR    Barriers to Discharge      Equipment Recommendations  3 in 1 bedside comode (RW)    Recommendations for Other Services Rehab consult  Frequency  Min 2X/week    Precautions / Restrictions Precautions Precautions: Fall Restrictions Weight Bearing Restrictions: No   Pertinent Vitals/Pain Stiffness in bil feet reported "if you dont use it you will lose it"    ADL  Eating/Feeding: Minimal assistance Where Assessed - Eating/Feeding: Bed level (wife (A)ing patient on arrival) Grooming: Teeth care;Moderate assistance Where Assessed - Grooming: Supported standing Lower Body Bathing: Maximal assistance Where Assessed - Lower Body Bathing: Supported standing Upper Body Dressing: Moderate assistance Where Assessed - Upper Body Dressing: Unsupported sitting Lower Body Dressing: +1 Total assistance Where Assessed - Lower Body Dressing: Unsupported sitting Toilet Transfer: Maximal assistance Toilet Transfer Method: Sit to stand Toilet Transfer Equipment: Regular height toilet;Grab bars Toileting - Clothing Manipulation and Hygiene: +1 Total assistance Where Assessed - Toileting Clothing Manipulation and Hygiene: Sit to stand from 3-in-1 or toilet Equipment Used: Rolling walker;Gait belt Transfers/Ambulation Related to ADLs: Pt requires total +2 pt 70% for  safe ambulation with IV pole. pt pushing RW too far in advance and needing max v/c. Pt requires max tactile (A) for positioning RW. ADL Comments: Pt required (A) for bed mobility and ambulation to sink. Pt with unsteady static standing at sink lEvel. Pt needed min v/c to initiate oral care. Pt attempting peri hygiene at sink and needed (A). pt requesting to use restroom at sink level. pt assisted with RW to bathroom. pt voiding bowels. pt with total (A) for peri hygiene due to safety in bathroom space. PT positioned in chair with wife present.    OT Diagnosis: Generalized weakness;Cognitive deficits  OT Problem List: Decreased strength;Decreased activity tolerance;Impaired balance (sitting and/or standing);Decreased safety awareness;Decreased knowledge of use of DME or AE;Decreased knowledge of precautions;Decreased cognition OT Treatment Interventions: Self-care/ADL training;DME and/or AE instruction;Therapeutic activities;Cognitive remediation/compensation;Patient/family education;Balance training   OT Goals Acute Rehab OT Goals OT Goal Formulation: With patient Time For Goal Achievement: 07/10/12 Potential to Achieve Goals: Good ADL Goals Pt Will Perform Grooming: with min assist;Standing at sink;Supported ADL Goal: Grooming - Progress: Goal set today Pt Will Perform Upper Body Bathing: with supervision;Standing at sink;Supported ADL Goal: Product manager - Progress: Goal set today Pt Will Perform Lower Body Bathing: with mod assist;Sit to stand from chair ADL Goal: Lower Body Bathing - Progress: Goal set today Pt Will Perform Upper Body Dressing: with min assist;Sit to stand from chair ADL Goal: Upper Body Dressing - Progress: Goal set today Pt Will Perform Lower Body Dressing: with mod assist;Sit to stand from chair ADL Goal: Lower Body Dressing - Progress: Goal set today Pt Will Transfer to Toilet: with min assist;Ambulation;3-in-1 ADL Goal: Toilet Transfer - Progress: Goal set  today  Visit Information  Last OT Received On: 06/26/12 Assistance Needed: +2  PT/OT Co-Evaluation/Treatment: Yes    Subjective Data  Subjective: "I have been here 11 days"- pt educated that arrival to [facility] was 06/23/12 and it was currently 06/26/12 pt repeating aloud information. Pt provided questioning cue "how long have you been here if you arrived on 06/23/12 and its 06/26/12?" Patient Stated Goal: to return to dancing- pt states "i think my dancing days are over now"   Prior Functioning     Home Living Lives With: Spouse Available Help at Discharge: Family Type of Home: House Home Access: Stairs to enter Secretary/administrator of Steps: 6 Entrance Stairs-Rails: Right;Left Home Layout: One level Bathroom Shower/Tub: Health visitor: Standard Home Adaptive Equipment: None Prior Function Level of Independence: Independent Able to Take Stairs?: Yes Driving: Yes Vocation: Retired Musician: No difficulties Dominant Hand: Right         Vision/Perception     Cognition  Overall Cognitive Status: Impaired Area of Impairment: Attention;Safety/judgement;Awareness of deficits;Problem solving Arousal/Alertness: Awake/alert Orientation Level: Appears intact for tasks assessed Behavior During Session: [facility] for tasks performed Current Attention Level: Sustained Attention - Other Comments: Pt easily distracted and continues to repeat himself. Safety/Judgement: Decreased safety judgement for tasks assessed Safety/Judgement - Other Comments: Pt needs cues for safety especially with ambulation. Problem Solving: Pt slow to process and needs extra time to complete task. Cognition - Other Comments: pt benefits from least distracting environment due to attention span    Extremity/Trunk Assessment Right Upper Extremity Assessment RUE ROM/Strength/Tone: Within functional levels RUE Coordination: WFL - gross motor Left Upper Extremity Assessment LUE  ROM/Strength/Tone: WFL for tasks assessed LUE Coordination: WFL - gross motor Right Lower Extremity Assessment RLE ROM/Strength/Tone: Deficits RLE ROM/Strength/Tone Deficits: At least 3/5 gross; difficult to fully assess due to cognition` Left Lower Extremity Assessment LLE ROM/Strength/Tone: Deficits;Unable to fully assess LLE ROM/Strength/Tone Deficits: At least 3/5 gross with noticeable knee buckling Trunk Assessment Trunk Assessment: Normal     Mobility Bed Mobility Bed Mobility: Right Sidelying to Sit;Sitting - Scoot to Edge of Bed Right Sidelying to Sit: 3: Mod assist;With rails;HOB flat Sitting - Scoot to Edge of Bed: 4: Min assist Details for Bed Mobility Assistance: (A) to elevate trunk OOB with max cues for technique.  (A) for proper weight shift to advance hips to EOB. Transfers Sit to Stand: 2: Max assist;From bed;From toilet Stand to Sit: 2: Max assist;To toilet;To chair/3-in-1 Details for Transfer Assistance: (A) to initiate transfer and slowly descend to toliet/recliner with max cues for hand placement.  Pt with slow problem solving and motor planning for body position and hand placement.       Shoulder Instructions     Exercise     Balance Balance Balance Assessed: Yes Static Sitting Balance Static Sitting - Balance Support: Feet supported Static Sitting - Level of Assistance: 5: Stand by assistance Static Standing Balance Static Standing - Balance Support: During functional activity Static Standing - Level of Assistance: 3: Mod assist;4: Min assist Static Standing - Comment/# of Minutes:   (A) to maintain balance with occasional knee buckling and forward lean    End of Session OT - End of Session Activity Tolerance: Patient tolerated treatment well Patient left: in chair;with call bell/phone within reach;with family/visitor present Nurse Communication: Mobility status;Precautions  GO     Lucile Shutters 06/26/2012, 11:09 AM Pager: [phone]

## 2012-06-26 NOTE — Progress Notes (Signed)
PULMONARY  / CRITICAL CARE MEDICINE  Name: [name] MRN: [id number] DOB: 03/19/1920    LOS: 3  REFERRING MD :  EDP  CHIEF COMPLAINT:  AMS with left SDH  LINES / TUBES: Drain head 1/4>>> 1/6  CULTURES:  ANTIBIOTICS:  SIGNIFICANT EVENTS:  1-4 sdh with plan to go to or. Reversal on going 1/5- improved neurostatus  LEVEL OF CARE:  icu PRIMARY SERVICE:  p[facility]m CONSULTANTS:  NS CODE STATUS DIET:  NPO DVT Px:  PAS GI Px:  PPI  INTERVAL HISTORY:  Improving RUE fxn Remains quite confused, not at his baseline per family  VITAL SIGNS: Temp:  [97.9 F (36.6 C)-100.5 F (38.1 C)] 99 F (37.2 C) (01/07 0400) Pulse Rate:  [87-110] 99  (01/07 1100) Resp:  [17-29] 22  (01/07 1100) BP: (113-178)/(79-122) 151/93 mmHg (01/07 1100) SpO2:  [90 %-97 %] 97 % (01/07 1100) Weight:  [83 kg (182 lb 15.7 oz)] 83 kg (182 lb 15.7 oz) (01/07 0500) HEMODYNAMICS:   VENTILATOR SETTINGS:   INTAKE / OUTPUT: Intake/Output      01/06 0701 - 01/07 0700 01/07 0701 - 01/08 0700   I.V. (mL/kg) 1150 (13.9) 250 (3)   IV Piggyback 60    Total Intake(mL/kg) 1210 (14.6) 250 (3)   Urine (mL/kg/hr) 700 (0.4)    Stool 4    Total Output 704    Net +506 +250        Urine Occurrence 3 x 1 x     PHYSICAL EXAMINATION: General:  Elderly man, comfortable Neuro:  Rt side weakness. Very talkative but still some confusion HEENT:  No LAN, JVD Cardiovascular:  Regular, no M Lungs:  diminished in bases Abdomen:  +bs Musculoskeletal:  intact Skin:  Warm mild lower ext edema   LABS: Cbc  Lab 06/26/12 0440 06/25/12 0528 06/24/12 0603  WBC 9.5 -- --  HGB 10.7* 11.2* 12.2*  HCT 31.2* 33.2* 35.0*  PLT 121* 125* 139*    Chemistry   Lab 06/26/12 0440 06/25/12 0528 06/24/12 0603 06/23/12 1644  NA 138 137 138 --  K 3.8 3.4* 5.0 --  CL 102 101 102 --  CO2 26 25 27  --  BUN 23 22 21  --  CREATININE 0.98 1.02 0.97 --  CALCIUM 8.7 9.2 9.0 --  MG 1.7 1.6 -- 1.6  PHOS 2.4 3.0 -- 2.9  GLUCOSE  122* 91 103* --    Liver fxn  Lab 06/23/12 1644 06/23/12 1235  AST 23 22  ALT 13 13  ALKPHOS 58 58  BILITOT 2.2* 1.9*  PROT 6.5 6.5  ALBUMIN 3.3* 3.4*   coags  Lab 06/26/12 0440 06/25/12 0528 06/24/12 1050 06/23/12 1644  APTT -- 36 -- 30  INR 1.37 1.28 1.27 --   Sepsis markers No results found for this basename: LATICACIDVEN:3,PROCALCITON:3 in the last 168 hours Cardiac markers No results found for this basename: CKTOTAL:3,CKMB:3,TROPONINI:3 in the last 168 hours BNP No results found for this basename: PROBNP:3 in the last 168 hours ABG  Lab 06/24/12 0402  PHART 7.406  PCO2ART 45.4*  PO2ART 87.7  HCO3 27.9*  TCO2 29.3    CBG trend  Lab 06/26/12 0815 06/26/12 0338 06/25/12 2337 06/25/12 1954 06/25/12 1601  GLUCAP 118* 118* 102* 121* 136*    IMAGING: Dg Chest Port 1 View  06/25/2012  *RADIOLOGY REPORT*  Clinical Data: Aspiration  PORTABLE CHEST - 1 VIEW  Comparison: Yesterday  Findings: Thorax markedly rotated to the right limiting the exam. Right upper lobe  consolidation is difficult to visualize.  Left lung is grossly clear.  Cardiomegaly.  No pneumothorax.  IMPRESSION: Limited exam.  Right upper lobe consolidation is difficult to assess.  Left lung is grossly clear.   Original Report Authenticated By: Jolaine Click, M.D.     ECG: afib with vr 74  DIAGNOSES: Active Problems:  SDH (subdural hematoma)  Dementia  PVD (peripheral vascular disease)  BPH (benign prostatic hyperplasia)  Thyroid activity decreased  HTN (hypertension)  Atrial fibrillation, persistent    ASSESSMENT / PLAN:  PULMONARY  ASSESSMENT: Stable airway protection, no evidence aspiration PLAN:   - follow clinically, follow CXR   CARDIOVASCULAR AFIB PVD post left CEA HTN in setting SDH ASSESSMENT: PLAN:  -No further coumadin, reversal given; doubt he will ever be a good candidate to restart -goal SBP 140-160, hydralazine on order -add back home BP meds 1/7 >> metop + HCTZ. Add  back the lisinopril on 1/8 if well tolerated  RENAL Lab Results  Component Value Date   CREATININE 0.98 06/26/2012   CREATININE 1.02 06/25/2012   CREATININE 0.97 06/24/2012    ASSESSMENT:  No acute issue PLAN:   Follow BMP  GASTROINTESTINAL  ASSESSMENT:  At risk dysphagia, tolerating diet 1/7 PLAN:   Start diet 1/6 ppi  HEMATOLOGIC coagulopathic  ASSESSMENT:  PLAN:  -ppi -follow CBC -scd -would not restart coumadin  INFECTIOUS  ASSESSMENT:  Abx for ventric drain PLAN:   D/c ceftriaxone 1/7  ENDOCRINE  ASSESSMENT:   Hypothroid PLAN:   -iv synthroid >> change to PO on 1/7 -cbg  NEUROLOGIC Left SDH with right hemiparesis. Dementia ASSESSMENT:   AMS PLAN:   -per NS; drain removed  CLINICAL SUMMARY:  89 with sdh on coumadin , reversed  INR and drain placed, improved clinical status. Will transfer to floor, have CIR evaluate per PT/OT recs.   Levy Pupa, MD, PhD 06/26/2012, 11:59 AM [facility] [phone] or if no answer [phone]

## 2012-06-26 NOTE — Progress Notes (Signed)
Rehab Admissions Coordinator Note:  Patient was screened by Trish Mage for appropriateness for an Inpatient Acute Rehab Consult.  At this time, we are recommending Inpatient Rehab consult.  Trish Mage 06/26/2012, 11:23 AM  I can be reached at [phone].

## 2012-06-27 ENCOUNTER — Encounter (HOSPITAL_COMMUNITY): Payer: Self-pay | Admitting: Radiology

## 2012-06-27 ENCOUNTER — Inpatient Hospital Stay (HOSPITAL_COMMUNITY)
Admission: RE | Admit: 2012-06-27 | Discharge: 2012-07-06 | DRG: 945 | Disposition: A | Discharge status: Home / Self Care | Payer: Medicare Other | Source: Intra-hospital | Attending: Physical Medicine & Rehabilitation | Admitting: Physical Medicine & Rehabilitation

## 2012-06-27 ENCOUNTER — Inpatient Hospital Stay (HOSPITAL_COMMUNITY): Payer: Medicare Other

## 2012-06-27 DIAGNOSIS — E039 Hypothyroidism, unspecified: Secondary | ICD-10-CM | POA: Diagnosis present

## 2012-06-27 DIAGNOSIS — W19XXXA Unspecified fall, initial encounter: Secondary | ICD-10-CM | POA: Diagnosis present

## 2012-06-27 DIAGNOSIS — E785 Hyperlipidemia, unspecified: Secondary | ICD-10-CM | POA: Diagnosis present

## 2012-06-27 DIAGNOSIS — M109 Gout, unspecified: Secondary | ICD-10-CM | POA: Diagnosis present

## 2012-06-27 DIAGNOSIS — S065X9A Traumatic subdural hemorrhage with loss of consciousness of unspecified duration, initial encounter: Secondary | ICD-10-CM

## 2012-06-27 DIAGNOSIS — I10 Essential (primary) hypertension: Secondary | ICD-10-CM | POA: Diagnosis present

## 2012-06-27 DIAGNOSIS — S065X0A Traumatic subdural hemorrhage without loss of consciousness, initial encounter: Secondary | ICD-10-CM | POA: Diagnosis present

## 2012-06-27 DIAGNOSIS — I4891 Unspecified atrial fibrillation: Secondary | ICD-10-CM | POA: Diagnosis present

## 2012-06-27 DIAGNOSIS — F0390 Unspecified dementia without behavioral disturbance: Secondary | ICD-10-CM | POA: Diagnosis present

## 2012-06-27 DIAGNOSIS — Z7901 Long term (current) use of anticoagulants: Secondary | ICD-10-CM

## 2012-06-27 DIAGNOSIS — N400 Enlarged prostate without lower urinary tract symptoms: Secondary | ICD-10-CM | POA: Diagnosis present

## 2012-06-27 DIAGNOSIS — Z5189 Encounter for other specified aftercare: Principal | ICD-10-CM

## 2012-06-27 LAB — BASIC METABOLIC PANEL
CO2: 27 mEq/L (ref 19–32)
Chloride: 102 mEq/L (ref 96–112)
GFR calc non Af Amer: 61 mL/min — ABNORMAL LOW (ref >90)
Glucose, Bld: 107 mg/dL — ABNORMAL HIGH (ref 70–99)
Potassium: 3.7 mEq/L (ref 3.5–5.1)
Sodium: 138 mEq/L (ref 135–145)

## 2012-06-27 MED ORDER — ONDANSETRON HCL 4 MG PO TABS: 4.0000 mg | ORAL_TABLET | Freq: Four times a day (QID) | ORAL | Status: DC | PRN

## 2012-06-27 MED ORDER — LEVOTHYROXINE SODIUM 50 MCG PO TABS
50.0000 ug | ORAL_TABLET | Freq: Every day | ORAL | Status: DC
  Administered 2012-06-28 – 2012-07-06 (×9): 50 ug via ORAL
  Filled 2012-06-27 (×10): qty 1

## 2012-06-27 MED ORDER — SORBITOL 70 % SOLN
30.0000 mL | Freq: Every day | Status: DC | PRN
  Administered 2012-07-01: 30 mL via ORAL

## 2012-06-27 MED ORDER — ACETAMINOPHEN 325 MG PO TABS
325.0000 mg | ORAL_TABLET | ORAL | Status: DC | PRN
  Administered 2012-06-28 – 2012-07-04 (×3): 650 mg via ORAL
  Filled 2012-06-27 (×3): qty 2

## 2012-06-27 MED ORDER — HYDROCHLOROTHIAZIDE 25 MG PO TABS
12.5000 mg | ORAL_TABLET | Freq: Every day | ORAL | Status: DC
  Filled 2012-06-27 (×2): qty 0.5

## 2012-06-27 MED ORDER — FINASTERIDE 5 MG PO TABS
5.0000 mg | ORAL_TABLET | Freq: Every day | ORAL | Status: DC
  Administered 2012-06-27 – 2012-07-05 (×9): 5 mg via ORAL
  Filled 2012-06-27 (×10): qty 1

## 2012-06-27 MED ORDER — DOXAZOSIN MESYLATE 2 MG PO TABS
2.0000 mg | ORAL_TABLET | Freq: Every day | ORAL | Status: DC
  Administered 2012-06-27 – 2012-07-05 (×9): 2 mg via ORAL
  Filled 2012-06-27 (×10): qty 1

## 2012-06-27 MED ORDER — DONEPEZIL HCL 5 MG PO TABS
5.0000 mg | ORAL_TABLET | Freq: Every day | ORAL | Status: DC
  Administered 2012-06-28 – 2012-07-05 (×8): 5 mg via ORAL
  Filled 2012-06-27 (×9): qty 1

## 2012-06-27 MED ORDER — METOPROLOL TARTRATE 25 MG PO TABS
25.0000 mg | ORAL_TABLET | Freq: Two times a day (BID) | ORAL | Status: DC
  Administered 2012-06-27 – 2012-07-06 (×18): 25 mg via ORAL
  Filled 2012-06-27 (×22): qty 1

## 2012-06-27 MED ORDER — ONDANSETRON HCL 4 MG/2ML IJ SOLN: 4.0000 mg | Freq: Four times a day (QID) | INTRAMUSCULAR | Status: DC | PRN

## 2012-06-27 MED ORDER — SIMVASTATIN 20 MG PO TABS
20.0000 mg | ORAL_TABLET | Freq: Every evening | ORAL | Status: DC
  Administered 2012-06-27 – 2012-07-05 (×9): 20 mg via ORAL
  Filled 2012-06-27 (×10): qty 1

## 2012-06-27 MED ORDER — PANTOPRAZOLE SODIUM 40 MG PO TBEC
40.0000 mg | DELAYED_RELEASE_TABLET | Freq: Every day | ORAL | Status: DC
  Administered 2012-06-27 – 2012-07-05 (×9): 40 mg via ORAL
  Filled 2012-06-27 (×9): qty 1

## 2012-06-27 MED ORDER — POLYETHYLENE GLYCOL 3350 17 G PO PACK
17.0000 g | PACK | Freq: Every day | ORAL | Status: DC | PRN
  Filled 2012-06-27: qty 1

## 2012-06-27 MED ORDER — PANTOPRAZOLE SODIUM 40 MG PO TBEC: 40.0000 mg | DELAYED_RELEASE_TABLET | Freq: Every day | ORAL | Status: DC

## 2012-06-27 NOTE — Discharge Summary (Signed)
Physician Discharge Summary  Patient ID: [name] MRN: [id number] DOB/AGE: April 04, 1920 79 y.o.  Admit date: 06/23/2012 Discharge date: 06/27/2012    Discharge Diagnoses:   SDH (subdural hematoma) Dementia PVD (peripheral vascular disease) BPH (benign prostatic hyperplasia) Thyroid activity decreased HTN (hypertension) Atrial fibrillation, persistent    Brief Summary: [name] is a 79 y.o. y/o male with a PMH of dementia, hypothyroidism, HTN, A-Fib on coumadin who presented to [facility] on 1/4 with new onset weakness, and fall.   Work up demonstrated CT of head with large subdural hematoma on left side.  Upon presentation, his INR was 2.04 and was reversed for neurosurgical evaluation / procedure.  He underwent placement of subdural drain placement for evacuation of left sided subdural hematoma with significant improvement in mental status.  He required brief ventilatory support and was quickly extubated.  Coumadin was held and is felt that he likely will not be a candidate for restart.  He was empirically treated with Rocephin in the setting of subdural drain placement.     Consults: Neurosurgery - Dr. Wynetta Emery  LINES / TUBES:  Drain head 1/4>>> 1/6   CULTURES:  1/4 MRSA PCR>>>neg  ANTIBIOTICS:  Rocephin 1/4>>>1/7  SIGNIFICANT EVENTS:  1-4 sdh with plan to go to or. Reversal on going  1/5- improved neurostatus                                                                         Hospital Summary by Discharge Diagnosis  SDH (subdural hematoma) Dementia Patient admitted on 1/4 s/p fall with weakness.  CT confirmed Left SDH.  He underwent bedside drain placement with evacuation of SDH with improvement in symptoms.  He was treated empirically with rocephin in setting of subdural drain placement and has completed abx.    Discharge Plan: -continue aggressive PT/OT efforts, will transfer to CIR -fall precautions -no coumadin - see below -futher rec's per  NSGY   HTN (hypertension) Atrial fibrillation, persistent Known hx of hypertension and afib on coumadin prior to admit.  Coumadin was stopped in the setting of SDH s/p evacuation.  HTN medications have been resumed.  At this time it is felt he will not likely be a good candidate to resume coumadin given fall with SDH and advanced age.   Discharge Plan: -continue current anti-hypertensive regimen of metoprolol / HCTZ -consider restart of lisinopril pending BP trend -Discontinue coumadin indefinitely -follow CBC  Hypothyroidism No changes made during this admit.    Discharge Plan: -continue synthroid -regular follow up of TSH   PVD (peripheral vascular disease) BPH (benign prostatic hyperplasia)    Discharge Exam: General: Elderly man, comfortable  Neuro: Rt side weakness. Very talkative but still some confusion  HEENT: No LAN, JVD  Cardiovascular: Regular, no M  Lungs: diminished in bases  Abdomen: +bs  Musculoskeletal: intact  Skin: Warm mild lower ext edema    Filed Vitals:   06/26/12 2100 06/27/12 0200 06/27/12 0600 06/27/12 1026  BP: 159/89 128/78 139/73 104/70  Pulse: 89 93 92 76  Temp: 97.7 F (36.5 C) 98.2 F (36.8 C) 98 F (36.7 C) 98.9 F (37.2 C)  TempSrc:    Oral  Resp: 20 18 22 20   Height:  Weight:   176 lb 2.4 oz (79.9 kg)   SpO2: 97% 92% 95% 94%   Discharge Labs  BMET  Lab 06/27/12 0535 06/26/12 0440 06/25/12 0528 06/24/12 0603 06/23/12 1644  NA 138 138 137 138 139  K 3.7 3.8 -- -- --  CL 102 102 101 102 100  CO2 27 26 25 27 28   GLUCOSE 107* 122* 91 103* 94  BUN 23 23 22 21 21   CREATININE 1.04 0.98 1.02 0.97 0.98  CALCIUM 8.9 8.7 9.2 9.0 9.7  MG -- 1.7 1.6 -- 1.6  PHOS -- 2.4 3.0 -- 2.9   CBC  Lab 06/26/12 0440 06/25/12 0528 06/24/12 0603  HGB 10.7* 11.2* 12.2*  HCT 31.2* 33.2* 35.0*  WBC 9.5 9.5 9.2  PLT 121* 125* 139*   Anti-Coagulation  Lab 06/26/12 0440 06/25/12 0528 06/24/12 1050 06/24/12 0603 06/23/12 2208  INR 1.37  1.28 1.27 1.20 1.39   CURRENT MEDICATIONS    . donepezil  5 mg Oral Q1200  . doxazosin  2 mg Oral QHS  . finasteride  5 mg Oral QHS  . hydrochlorothiazide  12.5 mg Oral Daily  . levothyroxine  50 mcg Oral QAC breakfast  . metoprolol  25 mg Oral BID  . pantoprazole  40 mg Oral QHS  . simvastatin  20 mg Oral QPM    Disposition:  Discharge to [facility]  Discharged Condition: [name] has met maximum benefit of inpatient care and is medically stable and cleared for discharge.  Patient is pending follow up as above.      Time spent on disposition:  Greater than 35 minutes.   Signed: Canary Brim, NP-C [facility] Pgr: [phone]  Pt independently evaluated and appropriate for transfer to rehab at this point  Sandrea Hughs, MD Pulmonary and Critical Care Medicine [facility] Cell [phone] After 5:30 PM or weekends, call [phone]

## 2012-06-27 NOTE — Progress Notes (Signed)
UR COMPLETED  

## 2012-06-27 NOTE — Interval H&P Note (Signed)
[name] was admitted today to Inpatient Rehabilitation with the diagnosis of TBI.  The patient's history has been reviewed, patient examined, and there is no change in status.  Patient continues to be appropriate for intensive inpatient rehabilitation.  I have reviewed the patient's chart and labs.  Questions were answered to the patient's satisfaction.  [name] T 06/27/2012, 6:55 PM

## 2012-06-27 NOTE — H&P (Signed)
Physical Medicine and Rehabilitation Admission H&P    Chief Complaint  Patient presents with  . Altered Mental Status  : HPI: [name] is a 79 y.o. right-handed male with history of hypertension, atrial fibrillation with Coumadin therapy as well as mild dementia. Patient independent and active prior to admission. Admitted 06/23/2012 with altered mental status and right-sided weakness. There was a report of a fall a couple weeks ago. X-rays and imaging revealed a large subdural hematoma. INR on admission of 2.5 and reversed. Underwent bedside twist drill placement of subdural drain 06/23/2012 per Dr. Wynetta Emery. Follow cranial CT scan was significant decrease in size of left frontal parietal subdural hematoma. Patient maintained on a regular consistency diet. Patient maintained on ventilator support for a short time followed by critical care medicine. He remains off Coumadin therapy secondary to subdural hematoma. Physical and occupational therapy evaluations completed an ongoing with recommendations of physical medicine rehabilitation consult to consider inpatient rehabilitation services. Patient was felt to be a good candidate for inpatient rehabilitation services and was admitted for comprehensive rehabilitation program.  Review of Systems  Cardiovascular: Positive for palpitations.  Genitourinary: Positive for urgency.  Musculoskeletal: Positive for myalgias and falls.  Psychiatric/Behavioral: Positive for memory loss.  All other systems reviewed and are negative   Past Medical History  Diagnosis Date  . Dementia   . A-fib   . Enlarged prostate   . Thyroid disease     hypothyroid  . Hypertension    Past Surgical History  Procedure Date  . Eye surgery    No family history on file. Social History:  reports that he has never smoked. He does not have any smokeless tobacco history on file. He reports that he drinks alcohol. He reports that he does not use illicit drugs. Allergies:    Allergies  Allergen Reactions  . Contrast Media (Iodinated Diagnostic Agents)     rash   Medications Prior to Admission  Medication Sig Dispense Refill  . Cholecalciferol (VITAMIN D PO) Take 1 tablet by mouth every morning.      . donepezil (ARICEPT) 5 MG tablet Take 5 mg by mouth daily at 12 noon.      Marland Kitchen doxazosin (CARDURA) 2 MG tablet Take 2 mg by mouth at bedtime.      . finasteride (PROSCAR) 5 MG tablet Take 5 mg by mouth at bedtime.      . hydrochlorothiazide (HYDRODIURIL) 25 MG tablet Take 12.5 mg by mouth daily.      Marland Kitchen levothyroxine (SYNTHROID, LEVOTHROID) 50 MCG tablet Take 50 mcg by mouth daily at 12 noon.      Marland Kitchen lisinopril (PRINIVIL,ZESTRIL) 10 MG tablet Take 5 mg by mouth daily.      . metoprolol (LOPRESSOR) 50 MG tablet Take 25 mg by mouth 2 (two) times daily.      . simvastatin (ZOCOR) 40 MG tablet Take 20 mg by mouth every evening.        Home: Home Living Lives With: Spouse Available Help at Discharge: Family Type of Home: House Home Access: Stairs to enter Secretary/administrator of Steps: 6 Entrance Stairs-Rails: Right;Left Home Layout: One level Bathroom Shower/Tub: Health visitor: Standard Home Adaptive Equipment: None   Functional History: Prior Function Able to Take Stairs?: Yes Driving: Yes Vocation: Retired  Functional Status:  Mobility: Bed Mobility Bed Mobility: Right Sidelying to Sit;Sitting - Scoot to Edge of Bed Right Sidelying to Sit: 3: Mod assist;With rails;HOB flat Sitting - Scoot to Edge of Bed:  4: Min assist Transfers Transfers: Sit to Stand;Stand to Sit Sit to Stand: 2: Max assist;From bed;From toilet Stand to Sit: 2: Max assist;To toilet;To chair/3-in-1 Ambulation/Gait Ambulation/Gait Assistance: 1: +2 Total assist Ambulation/Gait: Patient Percentage: 70% Ambulation Distance (Feet): 25 Feet Assistive device: Rolling walker;2 person hand held assist (10' 2 person HHA) Ambulation/Gait Assistance Details: 16' 2  person HHA; 49' with RW; Pt needs +2 (A) for safety and lines with max cues for body position within RW and RW placement.  Pt tends to ambulate with RW too far in front.  Pt needs (A) to manage RW. Gait Pattern: Step-through pattern;Decreased stride length;Shuffle;Trunk flexed Gait velocity: decreased General Gait Details: Pt with moderate forward flexion and needs constant cues for upright posture. Stairs: No Wheelchair Mobility Wheelchair Mobility: No  ADL: ADL Eating/Feeding: Minimal assistance Where Assessed - Eating/Feeding: Bed level (wife (A)ing patient on arrival) Grooming: Teeth care;Moderate assistance Where Assessed - Grooming: Supported standing Lower Body Bathing: Maximal assistance Where Assessed - Lower Body Bathing: Supported standing Upper Body Dressing: Moderate assistance Where Assessed - Upper Body Dressing: Unsupported sitting Lower Body Dressing: +1 Total assistance Where Assessed - Lower Body Dressing: Unsupported sitting Toilet Transfer: Maximal assistance Toilet Transfer Method: Sit to stand Toilet Transfer Equipment: Regular height toilet;Grab bars Equipment Used: Rolling walker;Gait belt Transfers/Ambulation Related to ADLs: Pt requires total +2 pt 70% for safe ambulation with IV pole. pt pushing RW too far in advance and needing max v/c. Pt requires max tactile (A) for positioning RW. ADL Comments: Pt required (A) for bed mobility and ambulation to sink. Pt with unsteady static standing at sink lEvel. Pt needed min v/c to initiate oral care. Pt attempting peri hygiene at sink and needed (A). pt requesting to use restroom at sink level. pt assisted with RW to bathroom. pt voiding bowels. pt with total (A) for peri hygiene due to safety in bathroom space. PT positioned in chair with wife present.  Cognition: Cognition Arousal/Alertness: Awake/alert Orientation Level: Oriented to person;Disoriented to place;Disoriented to time;Disoriented to  situation Cognition Overall Cognitive Status: Impaired Area of Impairment: Attention;Safety/judgement;Awareness of deficits;Problem solving Arousal/Alertness: Awake/alert Orientation Level: Appears intact for tasks assessed Behavior During Session: [facility] for tasks performed Current Attention Level: Sustained Attention - Other Comments: Pt easily distracted and continues to repeat himself. Safety/Judgement: Decreased safety judgement for tasks assessed Safety/Judgement - Other Comments: Pt needs cues for safety especially with ambulation. Problem Solving: Pt slow to process and needs extra time to complete task. Cognition - Other Comments: pt benefits from least distracting environment due to attention span   Blood pressure 128/78, pulse 93, temperature 98.2 F (36.8 C), temperature source Oral, resp. rate 18, height 6' (1.829 m), weight 83 kg (182 lb 15.7 oz), SpO2 92.00%. Physical Exam  Constitutional: He appears well-developed and well-nourished.  79 year old white male in NAD  HENT:  Head: Normocephalic.  Right Ear: External ear normal.  Left Ear: External ear normal.  Eyes: Conjunctivae normal and EOM are normal. Pupils are equal, round, and reactive to light.  Pupils round and reactive to light  Neck: Neck supple. No thyromegaly present.  Cardiovascular: Normal rate, regular rhythm and normal heart sounds. Exam reveals no friction rub.  No murmur heard. Cardiac rate is controlled  Pulmonary/Chest: Effort normal and breath sounds normal. He has no wheezes. He has no rales.  Abdominal: Bowel sounds are normal. He exhibits no distension.  Musculoskeletal: He exhibits no edema.  Neurological: He is alert.  Patient was able name person place as  well as his date of birth. He did need some subtle cues for hospital course. Delayed processing. Occasionally perseverative, sometimes quite distracted. Speech sometimes dysarthric and difficult to understand. Patient would follow simple  commands. Moves all 4's with at least 4/5 strength. . Lower extremities seem limited somewhat by pain, especially the RLE, but inconsistent. No gross sensory finding.  Skin: Skin is warm and dry.  Abrasion near the right knee    Results for orders placed during the hospital encounter of 06/23/12 (from the past 48 hour(s))  GLUCOSE, CAPILLARY     Status: Normal   Collection Time   06/25/12  8:51 AM      Component Value Range Comment   Glucose-Capillary 83  70 - 99 mg/dL   GLUCOSE, CAPILLARY     Status: Normal   Collection Time   06/25/12 12:11 PM      Component Value Range Comment   Glucose-Capillary 81  70 - 99 mg/dL   GLUCOSE, CAPILLARY     Status: Abnormal   Collection Time   06/25/12  4:01 PM      Component Value Range Comment   Glucose-Capillary 136 (*) 70 - 99 mg/dL    Comment 1 Notify RN      Comment 2 Documented in Chart     GLUCOSE, CAPILLARY     Status: Abnormal   Collection Time   06/25/12  7:54 PM      Component Value Range Comment   Glucose-Capillary 121 (*) 70 - 99 mg/dL    Comment 1 Notify RN      Comment 2 Documented in Chart     GLUCOSE, CAPILLARY     Status: Abnormal   Collection Time   06/25/12 11:37 PM      Component Value Range Comment   Glucose-Capillary 102 (*) 70 - 99 mg/dL   GLUCOSE, CAPILLARY     Status: Abnormal   Collection Time   06/26/12  3:38 AM      Component Value Range Comment   Glucose-Capillary 118 (*) 70 - 99 mg/dL   BASIC METABOLIC PANEL     Status: Abnormal   Collection Time   06/26/12  4:40 AM      Component Value Range Comment   Sodium 138  135 - 145 mEq/L    Potassium 3.8  3.5 - 5.1 mEq/L    Chloride 102  96 - 112 mEq/L    CO2 26  19 - 32 mEq/L    Glucose, Bld 122 (*) 70 - 99 mg/dL    BUN 23  6 - 23 mg/dL    Creatinine, Ser 7.25  0.50 - 1.35 mg/dL    Calcium 8.7  8.4 - 36.6 mg/dL    GFR calc non Af Amer 70 (*) >90 mL/min    GFR calc Af Amer 81 (*) >90 mL/min   MAGNESIUM     Status: Normal   Collection Time   06/26/12  4:40 AM       Component Value Range Comment   Magnesium 1.7  1.5 - 2.5 mg/dL   PHOSPHORUS     Status: Normal   Collection Time   06/26/12  4:40 AM      Component Value Range Comment   Phosphorus 2.4  2.3 - 4.6 mg/dL   CBC     Status: Abnormal   Collection Time   06/26/12  4:40 AM      Component Value Range Comment   WBC 9.5  4.0 - 10.5 K/uL  RBC 3.21 (*) 4.22 - 5.81 MIL/uL    Hemoglobin 10.7 (*) 13.0 - 17.0 g/dL    HCT 16.1 (*) 09.6 - 52.0 %    MCV 97.2  78.0 - 100.0 fL    MCH 33.3  26.0 - 34.0 pg    MCHC 34.3  30.0 - 36.0 g/dL    RDW 04.5  40.9 - 81.1 %    Platelets 121 (*) 150 - 400 K/uL   PROTIME-INR     Status: Abnormal   Collection Time   06/26/12  4:40 AM      Component Value Range Comment   Prothrombin Time 16.5 (*) 11.6 - 15.2 seconds    INR 1.37  0.00 - 1.49   GLUCOSE, CAPILLARY     Status: Abnormal   Collection Time   06/26/12  8:15 AM      Component Value Range Comment   Glucose-Capillary 118 (*) 70 - 99 mg/dL   GLUCOSE, CAPILLARY     Status: Abnormal   Collection Time   06/26/12  1:29 PM      Component Value Range Comment   Glucose-Capillary 130 (*) 70 - 99 mg/dL   GLUCOSE, CAPILLARY     Status: Abnormal   Collection Time   06/26/12  3:37 PM      Component Value Range Comment   Glucose-Capillary 109 (*) 70 - 99 mg/dL    Comment 1 Notify RN      Comment 2 Documented in Chart     GLUCOSE, CAPILLARY     Status: Abnormal   Collection Time   06/26/12  7:36 PM      Component Value Range Comment   Glucose-Capillary 141 (*) 70 - 99 mg/dL    Comment 1 Notify RN      Comment 2 Documented in Chart     BASIC METABOLIC PANEL     Status: Abnormal   Collection Time   06/27/12  5:35 AM      Component Value Range Comment   Sodium 138  135 - 145 mEq/L    Potassium 3.7  3.5 - 5.1 mEq/L    Chloride 102  96 - 112 mEq/L    CO2 27  19 - 32 mEq/L    Glucose, Bld 107 (*) 70 - 99 mg/dL    BUN 23  6 - 23 mg/dL    Creatinine, Ser 9.14  0.50 - 1.35 mg/dL    Calcium 8.9  8.4 - 78.2 mg/dL    GFR calc  non Af Amer 61 (*) >90 mL/min    GFR calc Af Amer 71 (*) >90 mL/min    Ct Head Wo Contrast  06/27/2012  *RADIOLOGY REPORT*  Clinical Data: Subdural hemorrhage.  CT HEAD WITHOUT CONTRAST  Technique:  Contiguous axial images were obtained from the base of the skull through the vertex without contrast.  Comparison: Head CT scan 06/24/2012.  Findings: Drainage catheter over the left convexities has been removed.  Mixed attenuation extra-axial fluid collection on the left persists and appears increased in size measuring 1.1 cm in thickness compared to approximately 0.7 cm on the prior study.  The volume high attenuation material dependently within the collection appears increased.  Left to right midline shift of 0.8 cm is unchanged.  No abnormal extra-axial collection on the right is identified.  There is no hydrocephalus.  Tiny amount of pneumocephalus is noted.  No evidence of acute infarction.  IMPRESSION: Interval removal of subdural drainage catheter on the left with some  increase in the size of a mixed attenuation subdural hemorrhage on the left.  Left to right midline shift of 0.8 cm is unchanged.  There is no hydrocephalus.   Original Report Authenticated By: Holley Dexter, M.D.     Post Admission Physician Evaluation: 1. Functional deficits secondary  to left frontoparietal subdural hematoma s/p subdural drain//premorbid dementia. 2. Patient is admitted to receive collaborative, interdisciplinary care between the physiatrist, rehab nursing staff, and therapy team. 3. Patient's level of medical complexity and substantial therapy needs in context of that medical necessity cannot be provided at a lesser intensity of care such as a SNF. 4. Patient has experienced substantial functional loss from his/her baseline which was documented above under the "Functional History" and "Functional Status" headings.  Judging by the patient's diagnosis, physical exam, and functional history, the patient has potential  for functional progress which will result in measurable gains while on inpatient rehab.  These gains will be of substantial and practical use upon discharge  in facilitating mobility and self-care at the household level. 5. Physiatrist will provide 24 hour management of medical needs as well as oversight of the therapy plan/treatment and provide guidance as appropriate regarding the interaction of the two. 6. 24 hour rehab nursing will assist with bladder management, bowel management, safety, skin/wound care, disease management, medication administration, pain management and patient education  and help integrate therapy concepts, techniques,education, etc. 7. PT will assess and treat for:  Lower extremity strength, range of motion, stamina, balance, functional mobility, safety, adaptive techniques and equipment., NMR, cognitive behavioral needs, education.  Goals are: supervision. 8. OT will assess and treat for: ADL's, functional mobility, safety, upper extremity strength, adaptive techniques and equipment, NMR, cognitive behavioral needs, education.   Goals are: supervision to min assist. 9. SLP will assess and treat for: cognition, communication.  Goals are: min assist. 10. Case Management and Social Worker will assess and treat for psychological issues and discharge planning. 11. Team conference will be held weekly to assess progress toward goals and to determine barriers to discharge. 12. Patient will receive at least 3 hours of therapy per day at least 5 days per week. 13. ELOS: 10-14 days      Prognosis:  good   Medical Problem List and Plan: 1. Left frontoparietal subdural hematoma status post subdural drain 2. DVT Prophylaxis/Anticoagulation: SCDs. Monitor for any signs of DVT 3. Mood/mild dementia. Continue Aricept. Family does not feel patient is at his baseline and will continue to follow. 4. Neuropsych: This patient is not capable of making decisions on his/her own behalf. 5. Atrial  fibrillation. Coumadin reversed and discontinued secondary to subdural hematoma. Cardiac rate control. 6. Hypertension. Cardura 2 mg each bedtime ,hydrochlorothiazide 12.5 mg daily, Lopressor 25 mg twice a day. Monitor with increased mobility 7. BPH. Proscar 5 mg each bedtime. Check PVRs x3. 8. Hypothyroidism. Synthroid 9. Hyperlipidemia. Zocor  06/27/2012  Ivory Broad, MD

## 2012-06-27 NOTE — Progress Notes (Signed)
Subjective: Patient reports He is doing better with no nausea no vomiting headaches  Objective: Vital signs in last 24 hours: Temp:  [97.7 F (36.5 C)-98.9 F (37.2 C)] 98 F (36.7 C) (01/08 1400) Pulse Rate:  [76-96] 93  (01/08 1400) Resp:  [18-26] 20  (01/08 1400) BP: (89-159)/(47-102) 90/52 mmHg (01/08 1400) SpO2:  [91 %-97 %] 96 % (01/08 1400) Weight:  [79.9 kg (176 lb 2.4 oz)] 79.9 kg (176 lb 2.4 oz) (01/08 0600)  Intake/Output from previous day: 01/07 0701 - 01/08 0700 In: 610 [P.O.:120; I.V.:490] Out: -  Intake/Output this shift: Total I/O In: 360 [P.O.:360] Out: 200 [Urine:200]  Awake alert oriented x2 still somewhat confused no pronator drift  Lab Results:  Basename 06/26/12 0440 06/25/12 0528  WBC 9.5 9.5  HGB 10.7* 11.2*  HCT 31.2* 33.2*  PLT 121* 125*   BMET  Basename 06/27/12 0535 06/26/12 0440  NA 138 138  K 3.7 3.8  CL 102 102  CO2 27 26  GLUCOSE 107* 122*  BUN 23 23  CREATININE 1.04 0.98  CALCIUM 8.9 8.7    Studies/Results: Ct Head Wo Contrast  06/27/2012  *RADIOLOGY REPORT*  Clinical Data: Subdural hemorrhage.  CT HEAD WITHOUT CONTRAST  Technique:  Contiguous axial images were obtained from the base of the skull through the vertex without contrast.  Comparison: Head CT scan 06/24/2012.  Findings: Drainage catheter over the left convexities has been removed.  Mixed attenuation extra-axial fluid collection on the left persists and appears increased in size measuring 1.1 cm in thickness compared to approximately 0.7 cm on the prior study.  The volume high attenuation material dependently within the collection appears increased.  Left to right midline shift of 0.8 cm is unchanged.  No abnormal extra-axial collection on the right is identified.  There is no hydrocephalus.  Tiny amount of pneumocephalus is noted.  No evidence of acute infarction.  IMPRESSION: Interval removal of subdural drainage catheter on the left with some increase in the size of a mixed  attenuation subdural hemorrhage on the left.  Left to right midline shift of 0.8 cm is unchanged.  There is no hydrocephalus.   Original Report Authenticated By: Holley Dexter, M.D.     Assessment/Plan: Doing fairly well followup CAT scan shows small residual CSF fluid collection hygromas in nature left frontal minimal mass effect I think is more relative to CSF ohabraders one reexpand as his cortical atrophy age related will be very slow to fill the space and walls have some potential space for hygromas collections  LOS: 4 days     Krystena Reitter P 06/27/2012, 2:51 PM

## 2012-06-27 NOTE — Progress Notes (Signed)
PT Cancellation Note  Patient Details Name: [name] MRN: [id number] DOB: 12-30-1919   Cancelled Treatment:     PT session cancelled per pt wife's request.  Per wife, pt has had busy morning with family visiting & has just fallen asleep.   Will f/u tomorrow.      Verdell Face, [place] [phone] 06/27/2012

## 2012-06-27 NOTE — H&P (View-Only) (Signed)
Physical Medicine and Rehabilitation Admission H&P    Chief Complaint  Patient presents with  . Altered Mental Status  : HPI: [name] is a 79 y.o. right-handed male with history of hypertension, atrial fibrillation with Coumadin therapy as well as mild dementia. Patient independent and active prior to admission. Admitted 06/23/2012 with altered mental status and right-sided weakness. There was a report of a fall a couple weeks ago. X-rays and imaging revealed a large subdural hematoma. INR on admission of 2.5 and reversed. Underwent bedside twist drill placement of subdural drain 06/23/2012 per Dr. Cram. Follow cranial CT scan was significant decrease in size of left frontal parietal subdural hematoma. Patient maintained on a regular consistency diet. Patient maintained on ventilator support for a short time followed by critical care medicine. He remains off Coumadin therapy secondary to subdural hematoma. Physical and occupational therapy evaluations completed an ongoing with recommendations of physical medicine rehabilitation consult to consider inpatient rehabilitation services. Patient was felt to be a good candidate for inpatient rehabilitation services and was admitted for comprehensive rehabilitation program.  Review of Systems  Cardiovascular: Positive for palpitations.  Genitourinary: Positive for urgency.  Musculoskeletal: Positive for myalgias and falls.  Psychiatric/Behavioral: Positive for memory loss.  All other systems reviewed and are negative   Past Medical History  Diagnosis Date  . Dementia   . A-fib   . Enlarged prostate   . Thyroid disease     hypothyroid  . Hypertension    Past Surgical History  Procedure Date  . Eye surgery    No family history on file. Social History:  reports that he has never smoked. He does not have any smokeless tobacco history on file. He reports that he drinks alcohol. He reports that he does not use illicit drugs. Allergies:    Allergies  Allergen Reactions  . Contrast Media (Iodinated Diagnostic Agents)     rash   Medications Prior to Admission  Medication Sig Dispense Refill  . Cholecalciferol (VITAMIN D PO) Take 1 tablet by mouth every morning.      . donepezil (ARICEPT) 5 MG tablet Take 5 mg by mouth daily at 12 noon.      . doxazosin (CARDURA) 2 MG tablet Take 2 mg by mouth at bedtime.      . finasteride (PROSCAR) 5 MG tablet Take 5 mg by mouth at bedtime.      . hydrochlorothiazide (HYDRODIURIL) 25 MG tablet Take 12.5 mg by mouth daily.      . levothyroxine (SYNTHROID, LEVOTHROID) 50 MCG tablet Take 50 mcg by mouth daily at 12 noon.      . lisinopril (PRINIVIL,ZESTRIL) 10 MG tablet Take 5 mg by mouth daily.      . metoprolol (LOPRESSOR) 50 MG tablet Take 25 mg by mouth 2 (two) times daily.      . simvastatin (ZOCOR) 40 MG tablet Take 20 mg by mouth every evening.        Home: Home Living Lives With: Spouse Available Help at Discharge: Family Type of Home: House Home Access: Stairs to enter Entrance Stairs-Number of Steps: 6 Entrance Stairs-Rails: Right;Left Home Layout: One level Bathroom Shower/Tub: Walk-in shower Bathroom Toilet: Standard Home Adaptive Equipment: None   Functional History: Prior Function Able to Take Stairs?: Yes Driving: Yes Vocation: Retired  Functional Status:  Mobility: Bed Mobility Bed Mobility: Right Sidelying to Sit;Sitting - Scoot to Edge of Bed Right Sidelying to Sit: 3: Mod assist;With rails;HOB flat Sitting - Scoot to Edge of Bed:   4: Min assist Transfers Transfers: Sit to Stand;Stand to Sit Sit to Stand: 2: Max assist;From bed;From toilet Stand to Sit: 2: Max assist;To toilet;To chair/3-in-1 Ambulation/Gait Ambulation/Gait Assistance: 1: +2 Total assist Ambulation/Gait: Patient Percentage: 70% Ambulation Distance (Feet): 25 Feet Assistive device: Rolling walker;2 person hand held assist (10' 2 person HHA) Ambulation/Gait Assistance Details: 10' 2  person HHA; 15' with RW; Pt needs +2 (A) for safety and lines with max cues for body position within RW and RW placement.  Pt tends to ambulate with RW too far in front.  Pt needs (A) to manage RW. Gait Pattern: Step-through pattern;Decreased stride length;Shuffle;Trunk flexed Gait velocity: decreased General Gait Details: Pt with moderate forward flexion and needs constant cues for upright posture. Stairs: No Wheelchair Mobility Wheelchair Mobility: No  ADL: ADL Eating/Feeding: Minimal assistance Where Assessed - Eating/Feeding: Bed level (wife (A)ing patient on arrival) Grooming: Teeth care;Moderate assistance Where Assessed - Grooming: Supported standing Lower Body Bathing: Maximal assistance Where Assessed - Lower Body Bathing: Supported standing Upper Body Dressing: Moderate assistance Where Assessed - Upper Body Dressing: Unsupported sitting Lower Body Dressing: +1 Total assistance Where Assessed - Lower Body Dressing: Unsupported sitting Toilet Transfer: Maximal assistance Toilet Transfer Method: Sit to stand Toilet Transfer Equipment: Regular height toilet;Grab bars Equipment Used: Rolling walker;Gait belt Transfers/Ambulation Related to ADLs: Pt requires total +2 pt 70% for safe ambulation with IV pole. pt pushing RW too far in advance and needing max v/c. Pt requires max tactile (A) for positioning RW. ADL Comments: Pt required (A) for bed mobility and ambulation to sink. Pt with unsteady static standing at sink lEvel. Pt needed min v/c to initiate oral care. Pt attempting peri hygiene at sink and needed (A). pt requesting to use restroom at sink level. pt assisted with RW to bathroom. pt voiding bowels. pt with total (A) for peri hygiene due to safety in bathroom space. PT positioned in chair with wife present.  Cognition: Cognition Arousal/Alertness: Awake/alert Orientation Level: Oriented to person;Disoriented to place;Disoriented to time;Disoriented to  situation Cognition Overall Cognitive Status: Impaired Area of Impairment: Attention;Safety/judgement;Awareness of deficits;Problem solving Arousal/Alertness: Awake/alert Orientation Level: Appears intact for tasks assessed Behavior During Session: WFL for tasks performed Current Attention Level: Sustained Attention - Other Comments: Pt easily distracted and continues to repeat himself. Safety/Judgement: Decreased safety judgement for tasks assessed Safety/Judgement - Other Comments: Pt needs cues for safety especially with ambulation. Problem Solving: Pt slow to process and needs extra time to complete task. Cognition - Other Comments: pt benefits from least distracting environment due to attention span   Blood pressure 128/78, pulse 93, temperature 98.2 F (36.8 C), temperature source Oral, resp. rate 18, height 6' (1.829 m), weight 83 kg (182 lb 15.7 oz), SpO2 92.00%. Physical Exam  Constitutional: He appears well-developed and well-nourished.  79-year-old white male in NAD  HENT:  Head: Normocephalic.  Right Ear: External ear normal.  Left Ear: External ear normal.  Eyes: Conjunctivae normal and EOM are normal. Pupils are equal, round, and reactive to light.  Pupils round and reactive to light  Neck: Neck supple. No thyromegaly present.  Cardiovascular: Normal rate, regular rhythm and normal heart sounds. Exam reveals no friction rub.  No murmur heard. Cardiac rate is controlled  Pulmonary/Chest: Effort normal and breath sounds normal. He has no wheezes. He has no rales.  Abdominal: Bowel sounds are normal. He exhibits no distension.  Musculoskeletal: He exhibits no edema.  Neurological: He is alert.  Patient was able name person place as   well as his date of birth. He did need some subtle cues for hospital course. Delayed processing. Occasionally perseverative, sometimes quite distracted. Speech sometimes dysarthric and difficult to understand. Patient would follow simple  commands. Moves all 4's with at least 4/5 strength. . Lower extremities seem limited somewhat by pain, especially the RLE, but inconsistent. No gross sensory finding.  Skin: Skin is warm and dry.  Abrasion near the right knee    Results for orders placed during the hospital encounter of 06/23/12 (from the past 48 hour(s))  GLUCOSE, CAPILLARY     Status: Normal   Collection Time   06/25/12  8:51 AM      Component Value Range Comment   Glucose-Capillary 83  70 - 99 mg/dL   GLUCOSE, CAPILLARY     Status: Normal   Collection Time   06/25/12 12:11 PM      Component Value Range Comment   Glucose-Capillary 81  70 - 99 mg/dL   GLUCOSE, CAPILLARY     Status: Abnormal   Collection Time   06/25/12  4:01 PM      Component Value Range Comment   Glucose-Capillary 136 (*) 70 - 99 mg/dL    Comment 1 Notify RN      Comment 2 Documented in Chart     GLUCOSE, CAPILLARY     Status: Abnormal   Collection Time   06/25/12  7:54 PM      Component Value Range Comment   Glucose-Capillary 121 (*) 70 - 99 mg/dL    Comment 1 Notify RN      Comment 2 Documented in Chart     GLUCOSE, CAPILLARY     Status: Abnormal   Collection Time   06/25/12 11:37 PM      Component Value Range Comment   Glucose-Capillary 102 (*) 70 - 99 mg/dL   GLUCOSE, CAPILLARY     Status: Abnormal   Collection Time   06/26/12  3:38 AM      Component Value Range Comment   Glucose-Capillary 118 (*) 70 - 99 mg/dL   BASIC METABOLIC PANEL     Status: Abnormal   Collection Time   06/26/12  4:40 AM      Component Value Range Comment   Sodium 138  135 - 145 mEq/L    Potassium 3.8  3.5 - 5.1 mEq/L    Chloride 102  96 - 112 mEq/L    CO2 26  19 - 32 mEq/L    Glucose, Bld 122 (*) 70 - 99 mg/dL    BUN 23  6 - 23 mg/dL    Creatinine, Ser 0.98  0.50 - 1.35 mg/dL    Calcium 8.7  8.4 - 10.5 mg/dL    GFR calc non Af Amer 70 (*) >90 mL/min    GFR calc Af Amer 81 (*) >90 mL/min   MAGNESIUM     Status: Normal   Collection Time   06/26/12  4:40 AM       Component Value Range Comment   Magnesium 1.7  1.5 - 2.5 mg/dL   PHOSPHORUS     Status: Normal   Collection Time   06/26/12  4:40 AM      Component Value Range Comment   Phosphorus 2.4  2.3 - 4.6 mg/dL   CBC     Status: Abnormal   Collection Time   06/26/12  4:40 AM      Component Value Range Comment   WBC 9.5  4.0 - 10.5 K/uL      RBC 3.21 (*) 4.22 - 5.81 MIL/uL    Hemoglobin 10.7 (*) 13.0 - 17.0 g/dL    HCT 31.2 (*) 39.0 - 52.0 %    MCV 97.2  78.0 - 100.0 fL    MCH 33.3  26.0 - 34.0 pg    MCHC 34.3  30.0 - 36.0 g/dL    RDW 14.3  11.5 - 15.5 %    Platelets 121 (*) 150 - 400 K/uL   PROTIME-INR     Status: Abnormal   Collection Time   06/26/12  4:40 AM      Component Value Range Comment   Prothrombin Time 16.5 (*) 11.6 - 15.2 seconds    INR 1.37  0.00 - 1.49   GLUCOSE, CAPILLARY     Status: Abnormal   Collection Time   06/26/12  8:15 AM      Component Value Range Comment   Glucose-Capillary 118 (*) 70 - 99 mg/dL   GLUCOSE, CAPILLARY     Status: Abnormal   Collection Time   06/26/12  1:29 PM      Component Value Range Comment   Glucose-Capillary 130 (*) 70 - 99 mg/dL   GLUCOSE, CAPILLARY     Status: Abnormal   Collection Time   06/26/12  3:37 PM      Component Value Range Comment   Glucose-Capillary 109 (*) 70 - 99 mg/dL    Comment 1 Notify RN      Comment 2 Documented in Chart     GLUCOSE, CAPILLARY     Status: Abnormal   Collection Time   06/26/12  7:36 PM      Component Value Range Comment   Glucose-Capillary 141 (*) 70 - 99 mg/dL    Comment 1 Notify RN      Comment 2 Documented in Chart     BASIC METABOLIC PANEL     Status: Abnormal   Collection Time   06/27/12  5:35 AM      Component Value Range Comment   Sodium 138  135 - 145 mEq/L    Potassium 3.7  3.5 - 5.1 mEq/L    Chloride 102  96 - 112 mEq/L    CO2 27  19 - 32 mEq/L    Glucose, Bld 107 (*) 70 - 99 mg/dL    BUN 23  6 - 23 mg/dL    Creatinine, Ser 1.04  0.50 - 1.35 mg/dL    Calcium 8.9  8.4 - 10.5 mg/dL    GFR calc  non Af Amer 61 (*) >90 mL/min    GFR calc Af Amer 71 (*) >90 mL/min    Ct Head Wo Contrast  06/27/2012  *RADIOLOGY REPORT*  Clinical Data: Subdural hemorrhage.  CT HEAD WITHOUT CONTRAST  Technique:  Contiguous axial images were obtained from the base of the skull through the vertex without contrast.  Comparison: Head CT scan 06/24/2012.  Findings: Drainage catheter over the left convexities has been removed.  Mixed attenuation extra-axial fluid collection on the left persists and appears increased in size measuring 1.1 cm in thickness compared to approximately 0.7 cm on the prior study.  The volume high attenuation material dependently within the collection appears increased.  Left to right midline shift of 0.8 cm is unchanged.  No abnormal extra-axial collection on the right is identified.  There is no hydrocephalus.  Tiny amount of pneumocephalus is noted.  No evidence of acute infarction.  IMPRESSION: Interval removal of subdural drainage catheter on the left with some   increase in the size of a mixed attenuation subdural hemorrhage on the left.  Left to right midline shift of 0.8 cm is unchanged.  There is no hydrocephalus.   Original Report Authenticated By: Thomas D'Alessio, M.D.     Post Admission Physician Evaluation: 1. Functional deficits secondary  to left frontoparietal subdural hematoma s/p subdural drain//premorbid dementia. 2. Patient is admitted to receive collaborative, interdisciplinary care between the physiatrist, rehab nursing staff, and therapy team. 3. Patient's level of medical complexity and substantial therapy needs in context of that medical necessity cannot be provided at a lesser intensity of care such as a SNF. 4. Patient has experienced substantial functional loss from his/her baseline which was documented above under the "Functional History" and "Functional Status" headings.  Judging by the patient's diagnosis, physical exam, and functional history, the patient has potential  for functional progress which will result in measurable gains while on inpatient rehab.  These gains will be of substantial and practical use upon discharge  in facilitating mobility and self-care at the household level. 5. Physiatrist will provide 24 hour management of medical needs as well as oversight of the therapy plan/treatment and provide guidance as appropriate regarding the interaction of the two. 6. 24 hour rehab nursing will assist with bladder management, bowel management, safety, skin/wound care, disease management, medication administration, pain management and patient education  and help integrate therapy concepts, techniques,education, etc. 7. PT will assess and treat for:  Lower extremity strength, range of motion, stamina, balance, functional mobility, safety, adaptive techniques and equipment., NMR, cognitive behavioral needs, education.  Goals are: supervision. 8. OT will assess and treat for: ADL's, functional mobility, safety, upper extremity strength, adaptive techniques and equipment, NMR, cognitive behavioral needs, education.   Goals are: supervision to min assist. 9. SLP will assess and treat for: cognition, communication.  Goals are: min assist. 10. Case Management and Social Worker will assess and treat for psychological issues and discharge planning. 11. Team conference will be held weekly to assess progress toward goals and to determine barriers to discharge. 12. Patient will receive at least 3 hours of therapy per day at least 5 days per week. 13. ELOS: 10-14 days      Prognosis:  good   Medical Problem List and Plan: 1. Left frontoparietal subdural hematoma status post subdural drain 2. DVT Prophylaxis/Anticoagulation: SCDs. Monitor for any signs of DVT 3. Mood/mild dementia. Continue Aricept. Family does not feel patient is at his baseline and will continue to follow. 4. Neuropsych: This patient is not capable of making decisions on his/her own behalf. 5. Atrial  fibrillation. Coumadin reversed and discontinued secondary to subdural hematoma. Cardiac rate control. 6. Hypertension. Cardura 2 mg each bedtime ,hydrochlorothiazide 12.5 mg daily, Lopressor 25 mg twice a day. Monitor with increased mobility 7. BPH. Proscar 5 mg each bedtime. Check PVRs x3. 8. Hypothyroidism. Synthroid 9. Hyperlipidemia. Zocor  06/27/2012  Zach Valiant Dills, MD 

## 2012-06-27 NOTE — PMR Pre-admission (Signed)
PMR Admission Coordinator Pre-Admission Assessment  Patient: [name] is an 79 y.o., male MRN: [id number] DOB: April 16, 1920 Height: 6' (182.9 cm) Weight: 79.9 kg (176 lb 2.4 oz)              Insurance Information HMO:  No    PPO:       PCP:       IPA:       80/20:       OTHER:   PRIMARY: Medicare A/B      Policy#: [id number]      Subscriber: Otilio Miu CM Name:        Phone#:       Fax#:   Pre-Cert#:        Employer: Retired Benefits:  Phone #:       Name: Armed forces technical officer. Date: 03/21/87     Deduct: $1216      Out of Pocket Max: none      Life Max: unlimited CIR: 100%      SNF: 100 days Outpatient: 80%     Co-Pay: 20% Home Health: 100%      Co-Pay: none DME: 80%     Co-Pay: 20% Providers: patient's choice  SECONDARY: [employer]      Policy#: [id number]      Subscriber: Otilio Miu CM Name:        Phone#:       Fax#:   Pre-Cert#:        Employer:   Benefits:  Phone #:  [phone]       Name:   Eff. Date:       Deduct:        Out of Pocket Max:        Life Max:   CIR:        SNF:   Outpatient:       Co-Pay:   Home Health:        Co-Pay:   DME:       Co-Pay:    Emergency Contact Information Contact Information    Name Relation Home Work Mobile   [place] Spouse [phone]     Ophelia Charter [phone]  [phone]     Current Medical History  Patient Admitting Diagnosis: L frontal-parietal SDH   History of Present Illness:  A 79 y.o. right-handed male with history of hypertension, atrial fibrillation with Coumadin therapy as well as mild dementia. Patient independent and active prior to admission. Admitted 06/23/2012 with altered mental status and right-sided weakness. There was a report of a fall a couple weeks ago. X-rays and imaging revealed a large subdural hematoma. INR on admission of 2.5 and reversed. Underwent bedside twist drill placement of subdural drain 06/23/2012 per Dr. Wynetta Emery. Follow cranial CT scan was significant decrease in size of  left frontal parietal subdural hematoma. Patient maintained on a regular consistency diet. Patient maintained on ventilator support for a short time followed by critical care medicine. He remains off Coumadin therapy secondary to subdural hematoma. Physical and occupational therapy evaluations completed an ongoing with recommendations of physical medicine rehabilitation consult to consider inpatient rehabilitation services.  Patient is motivated and willing to work hard so that he can get back home with wife.   Total: 6 =NIH  Past Medical History  Past Medical History  Diagnosis Date  . Dementia   . A-fib   . Enlarged prostate   . Thyroid disease     hypothyroid  . Hypertension    Family History  family history  is not on file.  Prior Rehab/Hospitalizations: No previous rehab admissions.   Current Medications  Current facility-administered medications:0.9 %  sodium chloride infusion, , Intravenous, Continuous, Leslye Peer, MD;  donepezil (ARICEPT) tablet 5 mg, 5 mg, Oral, Q1200, Leslye Peer, MD;  doxazosin (CARDURA) tablet 2 mg, 2 mg, Oral, QHS, Leslye Peer, MD, 2 mg at 06/26/12 2206;  finasteride (PROSCAR) tablet 5 mg, 5 mg, Oral, QHS, Leslye Peer, MD, 5 mg at 06/26/12 2206 hydrALAZINE (APRESOLINE) injection 10-20 mg, 10-20 mg, Intravenous, Q4H PRN, Vilinda Blanks Minor, NP, 10 mg at 06/24/12 2006;  hydrochlorothiazide (HYDRODIURIL) tablet 12.5 mg, 12.5 mg, Oral, Daily, Leslye Peer, MD, 12.5 mg at 06/27/12 1019;  levothyroxine (SYNTHROID, LEVOTHROID) tablet 50 mcg, 50 mcg, Oral, QAC breakfast, Leslye Peer, MD, 50 mcg at 06/27/12 0749 metoprolol tartrate (LOPRESSOR) tablet 25 mg, 25 mg, Oral, BID, Leslye Peer, MD, 25 mg at 06/27/12 1018;  pantoprazole (PROTONIX) EC tablet 40 mg, 40 mg, Oral, QHS, Herby Abraham, PHARMD;  simvastatin (ZOCOR) tablet 20 mg, 20 mg, Oral, QPM, Leslye Peer, MD, 20 mg at 06/26/12 2206  Patients Current Diet: Cardiac  Precautions /  Restrictions Precautions Precautions: Fall Restrictions Weight Bearing Restrictions: No   Prior Activity Level Community (5-7x/wk): Micah Flesher out with friends, to the store, dancing, to the [employer].  Went out 5 days a week.     Home Assistive Devices / Equipment Home Assistive Devices/Equipment: None Home Adaptive Equipment: None  Prior Functional Level Prior Function Level of Independence: Independent Able to Take Stairs?: Yes Driving: Yes Vocation: Retired  Current Functional Level Cognition  Arousal/Alertness: Awake/alert Overall Cognitive Status: Impaired Current Attention Level: Sustained Attention - Other Comments: Pt easily distracted and continues to repeat himself. Orientation Level: Oriented to person;Disoriented to place;Disoriented to time;Disoriented to situation Safety/Judgement: Decreased safety judgement for tasks assessed Safety/Judgement - Other Comments: Pt needs cues for safety especially with ambulation. Cognition - Other Comments: pt benefits from least distracting environment due to attention span    Extremity Assessment (includes Sensation/Coordination)  RUE ROM/Strength/Tone: Within functional levels RUE Coordination: WFL - gross motor  RLE ROM/Strength/Tone: Deficits RLE ROM/Strength/Tone Deficits: At least 3/5 gross; difficult to fully assess due to cognition`    ADLs  Eating/Feeding: Minimal assistance Where Assessed - Eating/Feeding: Bed level (wife (A)ing patient on arrival) Grooming: Teeth care;Moderate assistance Where Assessed - Grooming: Supported standing Lower Body Bathing: Maximal assistance Where Assessed - Lower Body Bathing: Supported standing Upper Body Dressing: Moderate assistance Where Assessed - Upper Body Dressing: Unsupported sitting Lower Body Dressing: +1 Total assistance Where Assessed - Lower Body Dressing: Unsupported sitting Toilet Transfer: Maximal assistance Toilet Transfer Method: Sit to stand Toilet Transfer  Equipment: Regular height toilet;Grab bars Toileting - Clothing Manipulation and Hygiene: +1 Total assistance Where Assessed - Toileting Clothing Manipulation and Hygiene: Sit to stand from 3-in-1 or toilet Equipment Used: Rolling walker;Gait belt Transfers/Ambulation Related to ADLs: Pt requires total +2 pt 70% for safe ambulation with IV pole. pt pushing RW too far in advance and needing max v/c. Pt requires max tactile (A) for positioning RW. ADL Comments: Pt required (A) for bed mobility and ambulation to sink. Pt with unsteady static standing at sink lEvel. Pt needed min v/c to initiate oral care. Pt attempting peri hygiene at sink and needed (A). pt requesting to use restroom at sink level. pt assisted with RW to bathroom. pt voiding bowels. pt with total (A) for peri hygiene due to safety in  bathroom space. PT positioned in chair with wife present.    Mobility  Bed Mobility: Right Sidelying to Sit;Sitting - Scoot to Edge of Bed Right Sidelying to Sit: 3: Mod assist;With rails;HOB flat Sitting - Scoot to Edge of Bed: 4: Min assist    Transfers  Transfers: Sit to Stand;Stand to Sit Sit to Stand: 2: Max assist;From bed;From toilet Stand to Sit: 2: Max assist;To toilet;To chair/3-in-1    Ambulation / Gait / Stairs / Wheelchair Mobility  Ambulation/Gait Ambulation/Gait Assistance: 1: +2 Total assist Ambulation/Gait: Patient Percentage: 70% Ambulation Distance (Feet): 25 Feet Assistive device: Rolling walker;2 person hand held assist (10' 2 person HHA) Ambulation/Gait Assistance Details: 31' 2 person HHA; 79' with RW; Pt needs +2 (A) for safety and lines with max cues for body position within RW and RW placement.  Pt tends to ambulate with RW too far in front.  Pt needs (A) to manage RW. Gait Pattern: Step-through pattern;Decreased stride length;Shuffle;Trunk flexed Gait velocity: decreased General Gait Details: Pt with moderate forward flexion and needs constant cues for upright  posture. Stairs: No Corporate treasurer: No    Posture / Games developer Sitting - Balance Support: Feet supported Static Sitting - Level of Assistance: 5: Stand by assistance Static Standing Balance Static Standing - Balance Support: During functional activity Static Standing - Level of Assistance: 3: Mod assist;4: Min assist Static Standing - Comment/# of Minutes:   (A) to maintain balance with occasional knee buckling and forward lean     Special needs/care consideration BiPAP/CPAP No CPM No Continuous Drip IV NO Dialysis NO        Life Vest No Oxygen No Special Bed NO Trach Size No Wound Vac (area) No      Skin No                              Bowel mgmt:  Had BM 06/26/12 Bladder mgmt: Voiding in urnial, no incontinence Diabetic mgmt No     Previous Home Environment Living Arrangements: Spouse/significant other Lives With: Spouse Available Help at Discharge: Family Type of Home: House Home Layout: One level Home Access: Stairs to enter Entrance Stairs-Rails: Doctor, general practice of Steps: 6 Bathroom Shower/Tub: Health visitor: Standard Home Care Services: No  Discharge Living Setting Plans for Discharge Living Setting: Patient's home;House;Lives with (comment) (Lives with wife.) Type of Home at Discharge: House Discharge Home Layout: One level;Laundry or work area in basement;Able to live on main level with bedroom/bathroom Discharge Home Access: Stairs to enter [employer] of Steps: 6 at front entry. Do you have any problems obtaining your medications?: No  Social/Family/Support Systems Patient Roles: Spouse;Parent Contact Information: Guinevere Ferrari - spouse (h) [phone]; Susa Simmonds - son (h) [phone] ([phone] Anticipated Caregiver: wife and family Anticipated Caregiver's Contact Information: Wife is 53 yo but can assist.  Has a local son and  dtr-in-law. Ability/Limitations of Caregiver: Has children who live up [place]. Caregiver Availability: 24/7 Discharge Plan Discussed with Primary Caregiver: Yes Is Caregiver In Agreement with Plan?: Yes Does Caregiver/Family have Issues with Lodging/Transportation while Pt is in Rehab?: No    Goals/Additional Needs Patient/Family Goal for Rehab: PT/OT S/min A, ST min A goals Expected length of stay: 2 weeks + Cultural Considerations: Catholic, [place], and "from the [place]" per family. Dietary Needs: Heart diet Equipment Needs: TBD Pt/Family Agrees to Admission and willing to participate: Yes Program Orientation Provided &  Reviewed with Pt/Caregiver Including Roles  & Responsibilities: Yes   Decrease burden of Care through IP rehab admission:  Not applicable  Possible need for SNF placement upon discharge: No likely   Patient Condition: This patient's condition remains as documented in the Consult dated 06/26/12, in which the Rehabilitation Physician determined and documented that the patient's condition is appropriate for intensive rehabilitative care in an inpatient rehabilitation facility.  Preadmission Screen Completed By:  Trish Mage, 06/27/2012 11:29 AM ______________________________________________________________________   Discussed status with Dr. Riley Kill on 06/27/12 at 1144 and received telephone approval for admission today.  Admission Coordinator:  Trish Mage, time1144/Date01/08/14

## 2012-06-27 NOTE — Plan of Care (Signed)
Overall Plan of Care [facility]) Patient Details Name: [name] MRN: [id number] DOB: 02-10-1920  Diagnosis:  TBI  Primary Diagnosis:    Subdural hematoma, acute Co-morbidities: afib, bph, dementia Functional Problem List  Patient demonstrates impairments in the following areas: Balance, Bladder, Bowel, Cognition, Endurance, Medication Management, Motor, Pain, Safety and Skin Integrity  Basic ADL's: grooming, bathing, dressing and toileting Advanced ADL's: simple meal preparation  Transfers:  bed mobility, bed to chair, toilet, tub/shower, car and furniture Locomotion:  ambulation and stairs  Additional Impairments:  Functional use of upper extremity, Communication  expression and Social Cognition   social interaction, problem solving, memory, attention and awareness  Anticipated Outcomes Item Anticipated Outcome  Eating/Swallowing    Basic self-care  supervision  Tolieting  supervision  Bowel/Bladder  Pt will be continent of bowel and bladder with min assist  Transfers  supervision  Locomotion  supervision  Communication  Supervision-Mod I  Cognition  Min A-Supervision  Pain  Pt will rate pain of less than 3 on scale of 0-10 with min assist  Safety/Judgment  Pt will call for assist and remain free from falls  Other  Skin will remain intact with min assist   Therapy Plan:   OT Intensity: Minimum of 1-2 x/day, 45 to 90 minutes;Minumum of 2-3x/day, 45 to 90 minutes OT Frequency: 5 out of 7 days OT Duration/Estimated Length of Stay: 2-2/12 weeks SLP Intensity: Minumum of 1-2 x/day, 30 to 90 minutes SLP Frequency: 5 out of 7 days    Team Interventions: Item RN PT OT SLP SW TR Other  Self Care/Advanced ADL Retraining   x      Neuromuscular Re-Education  x x      Therapeutic Activities  x x x     UE/LE Strength Training/ROM  x x      UE/LE Coordination Activities  x x      Visual/Perceptual Remediation/Compensation         DME/Adaptive Equipment Instruction   x x      Therapeutic Exercise  x x      Balance/Vestibular Training  x x      Patient/Family Education  x x x     Cognitive Remediation/Compensation  x x x     Functional Mobility Training  x x      Ambulation/Gait Training  x       Museum/gallery curator  x       Wheelchair Propulsion/Positioning  x       Functional Tourist information centre manager Reintegration  x x      Dysphagia/Aspiration Landscape architect Facilitation    x     Bladder Management x        Bowel Management x        Disease Management/Prevention x        Pain Management x x x      Medication Management         Skin Care/Wound Management x        Splinting/Orthotics  x       Discharge Planning  x x x     Psychosocial Support x  x x                            Team Discharge Planning: Destination: PT-  ,OT-   , SLP-Home Projected Follow-up: PT- , OT-  Outpatient OT, SLP-24 hour supervision/assistance;Outpatient SLP;Home Health SLP Projected Equipment Needs: PT- , OT-  , SLP-None recommended by SLP Patient/family involved in discharge planning: PT-  ,  OT-Patient, SLP-Patient;Family member/caregiver  MD ELOS: 2 to 2.5 weeks Medical Rehab Prognosis:  Good Assessment: The patient has been admitted for CIR therapies. The team will be addressing, functional mobility, strength, stamina, balance, safety, adaptive techniques/equipment, self-care, bowel and bladder mgt, patient and caregiver education, NMR, cognitive remediation, communication, behavior, sleep-wake normalization. Goals have been set at supervision for most mobility and self-care activities and min assist for cognitiion. Fxnl progress will be inhibited by baseline dementia/age..      See Team Conference Notes for weekly updates to the plan of care

## 2012-06-27 NOTE — Progress Notes (Signed)
Rehab admissions - Evaluated for possible admission.  I spoke with patient, wife and several family members.  All are in agreement to inpatient rehab.  Awaiting MD clearance prior to admission to inpatient rehab today.  Call me for questions.  #[phone]

## 2012-06-27 NOTE — Progress Notes (Signed)
Pt admitted to room 4027, pt alert to self, wife present at bedside, pt and wife oriented to room, rehab unit, safety plan and fall risk signed.Pt confused, bed alarm on, call bell in reach, pt resting in bed

## 2012-06-28 ENCOUNTER — Inpatient Hospital Stay (HOSPITAL_COMMUNITY): Payer: Medicare Other | Admitting: Speech Pathology

## 2012-06-28 ENCOUNTER — Inpatient Hospital Stay (HOSPITAL_COMMUNITY): Payer: Medicare Other | Admitting: Physical Therapy

## 2012-06-28 ENCOUNTER — Encounter (HOSPITAL_COMMUNITY): Payer: Medicare Other | Admitting: Occupational Therapy

## 2012-06-28 ENCOUNTER — Encounter (HOSPITAL_COMMUNITY): Payer: Self-pay | Admitting: Emergency Medicine

## 2012-06-28 DIAGNOSIS — F0390 Unspecified dementia without behavioral disturbance: Secondary | ICD-10-CM

## 2012-06-28 DIAGNOSIS — W19XXXA Unspecified fall, initial encounter: Secondary | ICD-10-CM

## 2012-06-28 DIAGNOSIS — S065X9A Traumatic subdural hemorrhage with loss of consciousness of unspecified duration, initial encounter: Secondary | ICD-10-CM

## 2012-06-28 LAB — CBC WITH DIFFERENTIAL/PLATELET
Eosinophils Relative: 2 % (ref 0–5)
HCT: 30 % — ABNORMAL LOW (ref 39.0–52.0)
Hemoglobin: 10 g/dL — ABNORMAL LOW (ref 13.0–17.0)
Lymphocytes Relative: 31 % (ref 12–46)
Lymphs Abs: 2.5 10*3/uL (ref 0.7–4.0)
MCV: 98.4 fL (ref 78.0–100.0)
Monocytes Absolute: 0.8 10*3/uL (ref 0.1–1.0)
Platelets: 115 10*3/uL — ABNORMAL LOW (ref 150–400)
RBC: 3.05 MIL/uL — ABNORMAL LOW (ref 4.22–5.81)
WBC: 7.9 10*3/uL (ref 4.0–10.5)

## 2012-06-28 LAB — COMPREHENSIVE METABOLIC PANEL
ALT: 12 U/L (ref 0–53)
CO2: 28 mEq/L (ref 19–32)
Calcium: 8.9 mg/dL (ref 8.4–10.5)
GFR calc Af Amer: 81 mL/min — ABNORMAL LOW (ref >90)
GFR calc non Af Amer: 70 mL/min — ABNORMAL LOW (ref >90)
Glucose, Bld: 100 mg/dL — ABNORMAL HIGH (ref 70–99)
Sodium: 140 mEq/L (ref 135–145)

## 2012-06-28 MED ORDER — HYDROCHLOROTHIAZIDE 12.5 MG PO CAPS
12.5000 mg | ORAL_CAPSULE | Freq: Every day | ORAL | Status: DC
  Administered 2012-06-28 – 2012-07-06 (×9): 12.5 mg via ORAL
  Filled 2012-06-28 (×10): qty 1

## 2012-06-28 NOTE — Evaluation (Signed)
Physical Therapy Assessment and Plan  Patient Details  Name: [name] MRN: [id number] Date of Birth: 02-20-1920  PT Diagnosis: Abnormality of gait, Cognitive deficits, Difficulty walking and Muscle weakness Rehab Potential: Good ELOS: 10-14 days   Today's Date: 06/28/2012 Time: 1000-1100 60 minutes  Problem List:  Patient Active Problem List  Diagnosis  . INGROWN NAIL  . EPISTAXIS  . SDH (subdural hematoma)  . Dementia  . PVD (peripheral vascular disease)  . BPH (benign prostatic hyperplasia)  . Thyroid activity decreased  . HTN (hypertension)  . Atrial fibrillation, persistent  . Subdural hematoma, acute    Past Medical History:  Past Medical History  Diagnosis Date  . Dementia   . A-fib   . Enlarged prostate   . Thyroid disease     hypothyroid  . Hypertension    Past Surgical History:  Past Surgical History  Procedure Date  . Eye surgery     Assessment & Plan Clinical Impression: Patient is a 79 y.o. year old male with recent admission to the hospital on 06/23/2012 with altered mental status and right-sided weakness. There was a report of a fall a couple weeks ago. X-rays and imaging revealed a large subdural hematoma. INR on admission of 2.5 and reversed. Underwent bedside twist drill placement of subdural drain 06/23/2012 per Dr. Wynetta Emery. Follow cranial CT scan was significant decrease in size of left frontal parietal subdural hematoma.  Patient transferred to CIR on 06/27/2012 .   Patient currently requires mod with mobility secondary to muscle weakness, decreased attention, decreased awareness, decreased problem solving, decreased safety awareness and delayed processing and decreased standing balance and decreased balance strategies.  Prior to hospitalization, patient was independent  with mobility and lived with Spouse in a House home.  Home access is 6-8Stairs to enter.  Patient will benefit from skilled PT intervention to maximize safe functional mobility,  minimize fall risk and decrease caregiver burden for planned discharge home with 24 hour supervision.  Anticipate patient will benefit from follow up OP at discharge.  PT - End of Session Activity Tolerance: Tolerates 30+ min activity with multiple rests Endurance Deficit: Yes PT Assessment Rehab Potential: Good PT Plan PT Intensity: Minimum of 1-2 x/day ,45 to 90 minutes PT Frequency: 5 out of 7 days PT Duration Estimated Length of Stay: 10-14 days PT Treatment/Interventions: Ambulation/gait training;Community reintegration;DME/adaptive equipment instruction;Neuromuscular re-education;Therapeutic Exercise;UE/LE Strength taining/ROM;Splinting/orthotics;Pain management;Balance/vestibular training;Discharge planning;Cognitive remediation/compensation;Patient/family education;Stair training;UE/LE Coordination activities;Wheelchair propulsion/positioning;Therapeutic Activities;Functional mobility training PT Recommendation Follow Up Recommendations: Outpatient PT Patient destination: Home  PT Evaluation Precautions/Restrictions Precautions Precautions: Fall Restrictions Weight Bearing Restrictions: No Pain Pain Assessment Pain Assessment: No/denies pain upon PT entering Pain Score:   8 Pain Type: Acute pain Pain Location: Foot Pain Orientation: Right Pain Onset: Other (Comment) (with weight bearing) Pain Intervention(s): RN made aware Multiple Pain Sites: No Home Living/Prior Functioning Home Living Lives With: Spouse Available Help at Discharge: Family Type of Home: House Home Access: Stairs to enter Secretary/administrator of Steps: 6-8 Entrance Stairs-Rails: Left;Right Home Layout: Able to live on main level with bedroom/bathroom Prior Function Level of Independence: Independent with gait;Independent with transfers;Independent with basic ADLs;Independent with homemaking with ambulation Able to Take Stairs?: Yes Driving: Yes Vocation: Retired  IT consultant Overall Cognitive  Status: Impaired Attention: Selective Sustained Attention: Impaired Sustained Attention Impairment: Verbal complex;Functional complex Selective Attention: Impaired Selective Attention Impairment: Verbal complex;Functional complex Awareness: Impaired Awareness Impairment: Intellectual impairment Safety/Judgment: Impaired [place] Scales of Cognitive Functioning: Confused/appropriate Sensation Sensation Light Touch: Appears Intact  Proprioception: Appears Intact Coordination Gross Motor Movements are Fluid and Coordinated: Yes Motor  Motor Motor: Within Functional Limits  Mobility Transfers Sit to Stand: 3: Mod assist Sit to Stand Details (indicate cue type and reason): facilitation for forward wt shift and steadying upon standing Stand Pivot Transfers: 3: Mod assist Stand Pivot Transfer Details (indicate cue type and reason): manual facilitation for wt shifts due to posterior lean Locomotion  Ambulation Ambulation: Yes Ambulation/Gait Assistance: 1: +2 Total assist Ambulation Distance (Feet): 20 Feet Assistive device: 2 person hand held assist Ambulation/Gait Assistance Details: faciliatiton for wt shifts and steadying, cues to attention throughout Stairs / Additional Locomotion Stairs: Yes Stairs Assistance: 4: Min assist Stairs Assistance Details (indicate cue type and reason): cues for safety Stair Management Technique: Two rails Number of Stairs: 5  Wheelchair Mobility Wheelchair Mobility: No  Trunk/Postural Assessment  Cervical Assessment Cervical Assessment: Within Functional Limits Thoracic Assessment Thoracic Assessment: Within Functional Limits Lumbar Assessment Lumbar Assessment: Within Functional Limits Postural Control Postural Control: Deficits on evaluation Righting Reactions: delayed Protective Responses: delayed  Balance Static Sitting Balance Static Sitting - Level of Assistance: 5: Stand by assistance Static Standing Balance Static  Standing - Balance Support: During functional activity Static Standing - Level of Assistance: 3: Mod assist Dynamic Standing Balance Dynamic Standing - Balance Support: During functional activity Dynamic Standing - Level of Assistance: 3: Mod assist Extremity Assessment      RLE Assessment RLE Assessment: Within Functional Limits LLE Assessment LLE Assessment: Within Functional Limits  FIM:  FIM - Bed/Chair Transfer Bed/Chair Transfer: 4: Bed > Chair or W/C: Min A (steadying Pt. > 75%);4: Chair or W/C > Bed: Min A (steadying Pt. > 75%) FIM - Locomotion: Wheelchair Locomotion: Wheelchair: 0: Activity did not occur FIM - Locomotion: Ambulation Ambulation/Gait Assistance: 1: +2 Total assist Locomotion: Ambulation: 0: Activity did not occur FIM - Locomotion: Stairs Locomotion: Stairs: 0: Activity did not occur   Refer to Care Plan for Long Term Goals  Recommendations for other services: None  Discharge Criteria: Patient will be discharged from PT if patient refuses treatment 3 consecutive times without medical reason, if treatment goals not met, if there is a change in medical status, if patient makes no progress towards goals or if patient is discharged from hospital.  The above assessment, treatment plan, treatment alternatives and goals were discussed and mutually agreed upon: by patient and by family  Treatment initiated during session: Gait training with RW with min A in controlled environment and with obstacles.  Pt requires min-mod cuing for obstacle negotiation and RW safety.  Standing balance/problem solving/organization task with pt standing to build pipe tree.  Pt requires min-mod questioning cues and steadying assist for balance to complete activity.  Coordination activities with box step/dancing with min-mod A for balance and coordination, max cuing for safety.  Copeland Neisen 06/28/2012, 3:21 PM

## 2012-06-28 NOTE — Evaluation (Addendum)
Occupational Therapy Assessment and Plan  Patient Details  Name: [name] MRN: [id number] Date of Birth: 1919/09/11  OT Diagnosis: acute pain, cognitive deficits, hemiplegia affecting dominant side and muscle weakness (generalized) Rehab Potential: Rehab Potential: Good ELOS: 2-2/12 weeks   Today's Date: 06/28/2012 Time: 0900-1000 Time Calculation (min): 60 min  Problem List:  Patient Active Problem List  Diagnosis  . INGROWN NAIL  . EPISTAXIS  . SDH (subdural hematoma)  . Dementia  . PVD (peripheral vascular disease)  . BPH (benign prostatic hyperplasia)  . Thyroid activity decreased  . HTN (hypertension)  . Atrial fibrillation, persistent  . Subdural hematoma, acute    Past Medical History:  Past Medical History  Diagnosis Date  . Dementia   . A-fib   . Enlarged prostate   . Thyroid disease     hypothyroid  . Hypertension    Past Surgical History:  Past Surgical History  Procedure Date  . Eye surgery     Assessment & Plan Clinical Impression: Patient is a 79 y.o. year old male right-handed male with history of hypertension, atrial fibrillation with Coumadin therapy as well as mild dementia. Patient independent and active prior to admission. Admitted 06/23/2012 with altered mental status and right-sided weakness. There was a report of a fall a couple weeks ago. X-rays and imaging revealed a large subdural hematoma. INR on admission of 2.5 and reversed. Underwent bedside twist drill placement of subdural drain 06/23/2012 per Dr. Wynetta Emery. Follow cranial CT scan was significant decrease in size of left frontal parietal subdural hematoma. Patient maintained on a regular consistency diet. Patient maintained on ventilator support for a short time followed by critical care medicine. He remains off Coumadin therapy secondary to subdural hematoma.   Patient transferred to CIR on 06/27/2012 .    Patient currently requires mod with basic self-care skills and and with basic  mobility secondary to muscle weakness, impaired timing and sequencing, decreased coordination and decreased motor planning, decreased initiation, decreased attention, decreased awareness, decreased problem solving, decreased safety awareness, decreased memory, orientation and confusion and delayed processing and decreased standing balance, decreased postural control, hemiplegia, decreased balance strategies and difficulty maintaining precautions.  Prior to hospitalization, patient could complete ADL with independent .  Patient will benefit from skilled intervention to decrease level of assist with basic self-care skills and increase independence with basic self-care skills prior to discharge home with care partner.  Anticipate patient will require 24 hour supervision and follow up outpatient.  OT - End of Session Activity Tolerance: Tolerates 30+ min activity with multiple rests OT Assessment Rehab Potential: Good OT Plan OT Intensity: Minimum of 1-2 x/day, 45 to 90 minutes;Minumum of 2-3x/day, 45 to 90 minutes OT Frequency: 5 out of 7 days OT Duration/Estimated Length of Stay: 2-2/12 weeks OT Treatment/Interventions: Balance/vestibular training;Cognitive remediation/compensation;DME/adaptive equipment instruction;Community reintegration;Discharge planning;Patient/family education;Psychosocial support;Therapeutic Activities;Pain management;UE/LE Coordination activities;UE/LE Strength taining/ROM;Therapeutic Exercise;Self Care/advanced ADL retraining;Functional mobility training;Neuromuscular re-education OT Recommendation Recommendations for Other Services: Neuropsych consult Follow Up Recommendations: Outpatient OT  OT Evaluation Precautions/Restrictions  Precautions Precautions: Fall General Chart Reviewed: Yes Family/Caregiver Present: No    Pain Pain Assessment Pain Assessment: No/denies pain, right toes sore- RN aware Home Living/Prior Functioning Home Living Lives With:  Spouse Available Help at Discharge: Family Type of Home: House Home Access: Stairs to enter Home Layout: One level Bathroom Shower/Tub: Health visitor: Standard ADL  see FIM Vision/Perception  Vision - History Baseline Vision: Wears glasses only for reading, question visual spatial deficits  Praxis  Praxis: Impaired Praxis Impairment Details: Motor planning;Perseveration;Initiation  Cognition Overall Cognitive Status: Impaired Arousal/Alertness: Awake/alert Orientation Level: Oriented to person;Oriented to place Attention: Sustained;Focused Focused Attention: Appears intact Sustained Attention: Impaired Sustained Attention Impairment: Verbal complex;Functional complex Memory: Impaired Memory Impairment: Storage deficit;Retrieval deficit;Decreased recall of new information;Decreased short term memory Decreased Short Term Memory: Verbal basic;Functional basic Awareness: Impaired Awareness Impairment: Intellectual impairment Problem Solving: Impaired Problem Solving Impairment: Verbal basic;Functional basic Executive Function: Reasoning;Decision Making;Self Monitoring;Self Correcting;Organizing;Sequencing Reasoning: Impaired Reasoning Impairment: Verbal basic;Functional basic Sequencing: Impaired Sequencing Impairment: Functional basic Organizing: Impaired Organizing Impairment: Functional basic;Verbal basic Decision Making: Impaired Decision Making Impairment: Verbal basic;Functional basic Initiating: Appears intact Initiating Impairment: Verbal complex;Functional complex Self Monitoring: Impaired Self Monitoring Impairment: Functional basic;Verbal basic Self Correcting: Impaired Self Correcting Impairment: Verbal basic;Functional basic Behaviors: Impulsive Safety/Judgment: Impaired Ranch[place]ant Scales of Cognitive Functioning: Confused/appropriate Sensation Sensation Light Touch: Appears Intact Stereognosis: Appears Intact Hot/Cold: Appears  Intact Proprioception: Appears Intact Coordination Gross Motor Movements are Fluid and Coordinated: No Fine Motor Movements are Fluid and Coordinated: No Coordination and Movement Description: decreased coordination in right hand Motor  Motor Motor: Hemiplegia Mobility  Transfers Sit to Stand: 2: Max assist Stand to Sit: 3: Mod assist  Trunk/Postural Assessment  Cervical Assessment Cervical Assessment: Within Functional Limits Thoracic Assessment Thoracic Assessment: Within Functional Limits Lumbar Assessment Lumbar Assessment: Within Functional Limits Postural Control Postural Control: Deficits on evaluation Righting Reactions: delayed Protective Responses: delayed  Balance   Extremity/Trunk Assessment RUE Assessment RUE Assessment:  (4-/5 decreased coordination (dominant) LUE Assessment LUE Assessment: Within Functional Limits  See FIM for current functional status Refer to Care Plan for Long Term Goals  Recommendations for other services: Neuropsych  Discharge Criteria: Patient will be discharged from OT if patient refuses treatment 3 consecutive times without medical reason, if treatment goals not met, if there is a change in medical status, if patient makes no progress towards goals or if patient is discharged from hospital.  The above assessment, treatment plan, treatment alternatives and goals were discussed and mutually agreed upon: by patient  1:1 OT eval initiated. No family present but did review purpose, role and goal with patient. Self care retraining at sink level with focus on bed mobility, sit to stands, stand pivot transfer, sustained attention, simple problem solving, sequencing and planning, standing balance, UE coordination, orientation (x2) dressing and bathing and self feeding breakfast with mod questioning cuing for problem solving. Pt with sore right toes - RN aware impacting his sensitivity to foot. Pt requiring a to don shirt, pt forgot to put right  UE in sleeve and right UE incoordination with shaving. Pt with generalized confusion.   [name] [facility] 06/28/2012, 10:16 AM

## 2012-06-28 NOTE — Progress Notes (Signed)
Subjective/Complaints: Awake, alert, confused. Non-agitated A 12 point review of systems has been performed and if not noted above is otherwise negative.   Objective: Vital Signs: Blood pressure 122/69, pulse 68, temperature 97.6 F (36.4 C), temperature source Oral, resp. rate 19, height 5\' 8"  (1.727 m), weight 81.149 kg (178 lb 14.4 oz), SpO2 98.00%. Ct Head Wo Contrast  06/27/2012  *RADIOLOGY REPORT*  Clinical Data: Subdural hemorrhage.  CT HEAD WITHOUT CONTRAST  Technique:  Contiguous axial images were obtained from the base of the skull through the vertex without contrast.  Comparison: Head CT scan 06/24/2012.  Findings: Drainage catheter over the left convexities has been removed.  Mixed attenuation extra-axial fluid collection on the left persists and appears increased in size measuring 1.1 cm in thickness compared to approximately 0.7 cm on the prior study.  The volume high attenuation material dependently within the collection appears increased.  Left to right midline shift of 0.8 cm is unchanged.  No abnormal extra-axial collection on the right is identified.  There is no hydrocephalus.  Tiny amount of pneumocephalus is noted.  No evidence of acute infarction.  IMPRESSION: Interval removal of subdural drainage catheter on the left with some increase in the size of a mixed attenuation subdural hemorrhage on the left.  Left to right midline shift of 0.8 cm is unchanged.  There is no hydrocephalus.   Original Report Authenticated By: Holley Dexter, M.D.     Basename 06/26/12 0440  WBC 9.5  HGB 10.7*  HCT 31.2*  PLT 121*    Basename 06/27/12 0535 06/26/12 0440  NA 138 138  K 3.7 3.8  CL 102 102  GLUCOSE 107* 122*  BUN 23 23  CREATININE 1.04 0.98  CALCIUM 8.9 8.7   CBG (last 3)   Basename 06/26/12 1936 06/26/12 1537 06/26/12 1329  GLUCAP 141* 109* 130*    Wt Readings from Last 3 Encounters:  06/27/12 81.149 kg (178 lb 14.4 oz)  06/27/12 79.9 kg (176 lb 2.4 oz)  09/08/09  87.091 kg (192 lb)    Physical Exam:  Constitutional: He appears well-developed and well-nourished.  79 year old white male in NAD  HENT:  Head: Normocephalic.  Right Ear: External ear normal.  Left Ear: External ear normal.  Eyes: Conjunctivae normal and EOM are normal. Pupils are equal, round, and reactive to light.  Pupils round and reactive to light  Neck: Neck supple. No thyromegaly present.  Cardiovascular: Normal rate, regular rhythm and normal heart sounds. Exam reveals no friction rub.  No murmur heard. Cardiac rate is controlled  Pulmonary/Chest: Effort normal and breath sounds normal. He has no wheezes. He has no rales.  Abdominal: Bowel sounds are normal. He exhibits no distension.  Musculoskeletal: He exhibits no edema.  Neurological: He is alert.  Patient was oriented to [place].  Thought he was having surgery today. Didn't realize he is on reahb. Delayed processing. Occasionally perseverative, sometimes quite distracted. Speech sometimes dysarthric and difficult to understand. Patient would follow simple commands. Moves all 4's with at least 4/5 strength. . Lower extremities seem limited somewhat by pain, especially the RLE, but inconsistent. No gross sensory finding.  Skin: Skin is warm and dry.  Abrasion near the right knee   Assessment/Plan: 1. Functional deficits secondary to left frontoparietal subdural hematoma s/p subdural drain//premorbid dementia  which require 3+ hours per day of interdisciplinary therapy in a comprehensive inpatient rehab setting. Physiatrist is providing close team supervision and 24 hour management of active medical problems listed below. Physiatrist  and rehab team continue to assess barriers to discharge/monitor patient progress toward functional and medical goals.  Given age and premorbid dementia, his prognosis for cognitive improvement is quite guarded   FIM:                                 Medical Problem  List and Plan:  1. Left frontoparietal subdural hematoma status post subdural drain  2. DVT Prophylaxis/Anticoagulation: SCDs. Monitor for any signs of DVT  3. Mood/mild dementia. Continue Aricept. Not at baseline per family 4. Neuropsych: This patient is not capable of making decisions on his/her own behalf.  5. Atrial fibrillation. Coumadin reversed and discontinued secondary to subdural hematoma. Cardiac rate control.  6. Hypertension. Cardura 2 mg each bedtime ,hydrochlorothiazide 12.5 mg daily, Lopressor 25 mg twice a day. Monitor with increased mobility  7. BPH. Proscar 5 mg each bedtime. Check PVRs x3.  8. Hypothyroidism. Synthroid  9. Hyperlipidemia. Zocor   LOS (Days) 1 A FACE TO FACE EVALUATION WAS PERFORMED  SWARTZ,ZACHARY T 06/28/2012 6:34 AM

## 2012-06-28 NOTE — Progress Notes (Signed)
Patient information reviewed and entered into eRehab system by Shahiem Bedwell, RN, CRRN, PPS Coordinator.  Information including medical coding and functional independence measure will be reviewed and updated through discharge.     Per nursing patient was given "Data Collection Information Summary for Patients in Inpatient Rehabilitation Facilities with attached "Privacy Act Statement-Health Care Records" upon admission.  

## 2012-06-28 NOTE — Progress Notes (Signed)
Physical Therapy Session Note  Patient Details  Name: [name] MRN: [id number] Date of Birth: 07-10-1919  Today's Date: 06/28/2012 Time: 1308-6578 Time Calculation (min): 45 min  Short Term Goals: Week 1:     Skilled Therapeutic Interventions/Progress Updates:   Sit/stand transfers to/from chair/RW x6 with cuing for safe hand and foot placement during transfers.  Patient requires min A for initial standing balance to RW and safe hand placement during sit/stand (retention time is short).  Patient c/o 8/10 pain in right foot with weight bearing; declined to work on ambulation this session.  Patient ambulated 6-7 steps with RW to transfer from chair to EOB with min A.    Session focused on dynamic sitting balance at EOB.  Patient peformed dynamic sitting balance activities including reaching, crossing midline, tossing and catching ball.  Balance challenged further by performing activities with one knee extended to reduce contact with floor to one foot; alternating feet.  Patient ambulated 6-7 steps with RW to transfer back to chair at end of session, quick release belt in place.    Therapy Documentation Precautions:  Precautions Precautions: Fall   Pain: Pain Assessment Pain Assessment: 0-10 Pain Score:   8 Pain Type: Acute pain Pain Location: Foot Pain Orientation: Right Pain Onset: Other (Comment) (with weight bearing) Pain Intervention(s): RN made aware Multiple Pain Sites: No  See FIM for current functional status  Therapy/Group: Individual Therapy  Rexene Agent 06/28/2012, 2:14 PM

## 2012-06-28 NOTE — Progress Notes (Signed)
Social Work Assessment and Plan Social Work Assessment and Plan  Patient Details  Name: [name] MRN: [id number] Date of Birth: [birth year]  Today's Date: 06/28/2012  Problem List:  Patient Active Problem List  Diagnosis  . INGROWN NAIL  . EPISTAXIS  . SDH (subdural hematoma)  . Dementia  . PVD (peripheral vascular disease)  . BPH (benign prostatic hyperplasia)  . Thyroid activity decreased  . HTN (hypertension)  . Atrial fibrillation, persistent  . Subdural hematoma, acute   Past Medical History:  Past Medical History  Diagnosis Date  . Dementia   . A-fib   . Enlarged prostate   . Thyroid disease     hypothyroid  . Hypertension    Past Surgical History:  Past Surgical History  Procedure Date  . Eye surgery    Social History:  reports that he has never smoked. He does not have any smokeless tobacco history on file. He reports that he drinks alcohol. He reports that he does not use illicit drugs.  Family / Support Systems Marital Status: Married How Long?: 20+ years (second marriage for both) Patient Roles: Spouse;Parent Spouse/Significant Other: wife, Ladarius Seubert @ [phone] Children: Wife has 5 adult children with three living locally.  Wife's son and daughter-in-law: Susa Simmonds @ [phone] or [phone] Anticipated Caregiver: wife and family Ability/Limitations of Caregiver: Has children who live up [place]. Caregiver Availability: 24/7 Family Dynamics: Pt notes that wife and her children are very supportive.  His children are supportive from a distance.  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  Confirmation/ information difficult to follow at time - family able to follow and offer clarification for him.  Pt denies any significant emotional distress but does appear slightly frustrated with his functional limitations. Will request neuropsych to evaluate as family reports some premorbid dementia "mild". Recent Psychosocial Issues: None Pyschiatric History: None Substance Abuse History: None  Patient / Family Perceptions, Expectations & Goals Pt/Family understanding of illness & functional limitations: Pt with very minimal understanding of medical issues - refers to signs on his wall that tell him what happened in very basic terms.  Family with good understanding of pt's hemorrhage and current changes in cognition and physical functioning. Premorbid pt/family roles/activities: Pt and wife living at home independently, however, son notes that pt was experiencing some "sundowning" and home and was recently placed on Aricept by primary MD. Anticipated changes in roles/activities/participation: Wife will likely need to increase support for pt at least initially.  Her caregiver role to intensify. Pt/family expectations/goals: Pt and family hope to have pt regain his physical strength and hope for cognitive improvement.  Wife notes she has been seeing improvement everyday.  Community Resources [employer]: None Premorbid Home Care/DME Agencies: None Transportation available at discharge: yes Resource referrals recommended: Neuropsychology;Support group (specify)  Discharge Planning Insurance Resources: Medicare;Private Insurance (specify) [employer]) Financial Resources: Social Security Financial Screen Referred: No Living Expenses: Own Money Management: Spouse Do you have any problems obtaining your medications?: No Home Management: pt and wife  share Patient/Family Preliminary Plans: Pt to return home with wife as primary care - intermittent assistance  of other local family. Social Work Anticipated Follow Up Needs: HH/OP;Support Group Expected length of stay: 2 weeks +  Clinical Impression Pleasant, elderly gentleman here following TBI and "mild" premorbid dementia (per family.)  Able to complete interview, however, difficult to follow at times and family helps to clarify information.  Pt and family deny any s/s of significant emotional distress currently.  Will request neuropsych eval given diagnosis.  Cannan Beeck 06/28/2012, 3:33 PM

## 2012-06-28 NOTE — Evaluation (Signed)
Speech Language Pathology Assessment and Plan & Session Notes  Patient Details  Name: [name] MRN: [id number] Date of Birth: 1919/07/29  SLP Diagnosis: Cognitive Impairments;Dysarthria  Rehab Potential: Good ELOS: 2 weeks   Today's Date: 06/28/2012  Session 1 Time: 0900-1000 Time Calculation (min): 60 min  Session 2: 1100-1130 Time Calculation: 30 min  Skilled Therapeutic Intervention:   Session 1: Pt administered cognitive-linguistic evaluation. Please see below for details. Pt and his wife were educated on current cognitive function and goals of skilled SLP intervention. Both verbalized understanding and are in agreement.   Session 2: Treatment focus on diagnostic treatment for reading comprehension and cognitive recovery. Pt demonstrated appropriate reading comprehension for functional signs and at the sentence level but required supervision question cues at the paragraph level. SLP provided pt with timeline of events from current hospitalization to increase recall and intellectual awareness. Pt demonstrated understanding and utilized the visual aid with Min question cues. Pt's wife present throughout session.   Problem List:  Patient Active Problem List  Diagnosis  . INGROWN NAIL  . EPISTAXIS  . SDH (subdural hematoma)  . Dementia  . PVD (peripheral vascular disease)  . BPH (benign prostatic hyperplasia)  . Thyroid activity decreased  . HTN (hypertension)  . Atrial fibrillation, persistent  . Subdural hematoma, acute   Past Medical History:  Past Medical History  Diagnosis Date  . Dementia   . A-fib   . Enlarged prostate   . Thyroid disease     hypothyroid  . Hypertension    Past Surgical History:  Past Surgical History  Procedure Date  . Eye surgery     Assessment / Plan / Recommendation Clinical Impression  Pt is a 79 y.o. right-handed male with history of hypertension, atrial fibrillation with Coumadin therapy as well as mild dementia. Patient  independent and active prior to admission. Admitted 06/23/2012 with altered mental status and right-sided weakness. There was a report of a fall a couple weeks ago. X-rays and imaging revealed a large subdural hematoma. INR on admission of 2.5 and reversed. Underwent bedside twist drill placement of subdural drain 06/23/2012 per Dr. Wynetta Emery. Follow cranial CT scan was significant decrease in size of left frontal parietal subdural hematoma. Patient maintained on a regular consistency diet. Patient maintained on ventilator support for a short time followed by critical care medicine. He remains off Coumadin therapy secondary to subdural hematoma. Physical and occupational therapy evaluations completed an ongoing with recommendations of physical medicine rehabilitation consult to consider inpatient rehabilitation services. Patient was felt to be a good candidate for inpatient rehabilitation services and was admitted for comprehensive rehabilitation program. Pt transferred to CIR 06/27/12 and demonstrates behaviors consistent with a Rancho Level VI characterized by decreased intellectual awareness, safety awareness, recall of new information with decreased utilization of compensatory strategies, sustained attention, problem solving and organization all of which impact's pt's ability to complete functional ADL's and overall independence. Pt also demonstrates delayed processing and mild word-finding deficits with decreased awareness of errors. Pt would benefit from skilled SLP intervention to maximize cognitive recovery and verbal expression to increase pt's overall functional independence. Anticipate pt will need 24 hour supervision and f/u skilled SLP intervention.     SLP Assessment  Patient will need skilled Speech Lanaguage Pathology Services during CIR admission    Recommendations  Patient destination: Home Follow up Recommendations: 24 hour supervision/assistance;Outpatient SLP;Home Health SLP Equipment  Recommended: None recommended by SLP    SLP Frequency 5 out of 7 days  SLP Treatment/Interventions Cognitive remediation/compensation;Cueing hierarchy;Functional tasks;Environmental controls;Internal/external aids;Patient/family education;Therapeutic Activities;Speech/Language facilitation    Pain Pain Assessment Pain Assessment: No/denies pain Prior Functioning Cognitive/Linguistic Baseline: Baseline deficits Baseline deficit details: Pt with h/o dementia  Type of Home: House Lives With: Spouse Available Help at Discharge: Family  Short Term Goals: Week 1: SLP Short Term Goal 1 (Week 1): Pt will self-monitor and correct naming errors with supervision question and semantic cues.  SLP Short Term Goal 2 (Week 1): Pt will demonstrate orientation to time and siutation with Min A visual and semantic cues. SLP Short Term Goal 3 (Week 1): Pt will demonstrate functional problem solving for basic and familiar tasks with Mod A verbal and question cues.  SLP Short Term Goal 4 (Week 1): Pt will demonstrate sustained attention to a functional task for 15 mins with Min A verbal cues for redirection.  SLP Short Term Goal 5 (Week 1): Pt will maintain a topic during a functional conversation for ~4 turns with Min A verbal and question cues for redirection.  SLP Short Term Goal 6 (Week 1): Pt will identify 1 physical and 1 cognitive deficit with Mod A question and semantic cues.   See FIM for current functional status Refer to Care Plan for Long Term Goals  Recommendations for other services: Neuropsych  Discharge Criteria: Patient will be discharged from SLP if patient refuses treatment 3 consecutive times without medical reason, if treatment goals not met, if there is a change in medical status, if patient makes no progress towards goals or if patient is discharged from hospital.  The above assessment, treatment plan, treatment alternatives and goals were discussed and mutually agreed upon: by patient  and by family  [name] 06/28/2012, 1:06 PM

## 2012-06-29 ENCOUNTER — Inpatient Hospital Stay (HOSPITAL_COMMUNITY): Payer: Medicare Other | Admitting: Speech Pathology

## 2012-06-29 ENCOUNTER — Inpatient Hospital Stay (HOSPITAL_COMMUNITY): Payer: Medicare Other | Admitting: Physical Therapy

## 2012-06-29 ENCOUNTER — Inpatient Hospital Stay (HOSPITAL_COMMUNITY): Payer: Medicare Other | Admitting: Occupational Therapy

## 2012-06-29 ENCOUNTER — Inpatient Hospital Stay (HOSPITAL_COMMUNITY): Payer: Medicare Other

## 2012-06-29 NOTE — Progress Notes (Signed)
Physical Therapy Session Note  Patient Details  Name: [name] MRN: [id number] Date of Birth: 27-Dec-1919  Today's Date: 06/29/2012 Time: 1115-1200 Time Calculation (min): 45 min  Short Term Goals: Week 1:  PT Short Term Goal 1 (Week 1): pt will demonstrate emergent awareness in functional sittuation with mod A PT Short Term Goal 2 (Week 1): Pt will gait in controlled environment 150' with min A PT Short Term Goal 3 (Week 1): Pt will demo dynamic standing balance with min A for functional task  Skilled Therapeutic Interventions/Progress Updates:  Patient worked on dynamic sitting balance in recliner while donning shoes with mod A.  Patient able to lean anteriorly to carry out task without LOB.  Sit/stand from recliner to RW with min A for safety and initial standing balance.  Ambulation with RW on tiled and carpeted surfaces with min A 100' with cuing for increased clearance (patient tends to shuffle his feet) during ambulation and to stay within the walker.  Sit/stand transfers to/from armchair/RW x8 with cuing for safe hand placement and min A for safe initial standing balance.  Patient worked on dynamic standing balance at activity table with reaching across midline and throwing activities in standing for 4 bouts (2 bouts reaching right; 2 bouts reaching left) of 4 minutes each with brief seated rest break between bouts.  Ambulation with RW and min A 100' back to room.  Patient in recliner with quick release belt in place and wife present at end of session.    Therapy Documentation Precautions:  Precautions Precautions: Fall Restrictions Weight Bearing Restrictions: No  Pain: Pain Assessment Pain Assessment: No/denies pain Pain Score:   4 Pain Type: Acute pain Pain Location: Foot Pain Orientation: Right;Left Pain Descriptors: Aching Pain Onset: On-going Patients Stated Pain Goal: 2 Pain Intervention(s): Distraction;Ambulation/increased activity Multiple Pain Sites:  No  See FIM for current functional status  Therapy/Group: Individual Therapy  Rexene Agent 06/29/2012, 12:06 PM

## 2012-06-29 NOTE — Progress Notes (Signed)
Physical Therapy Note  Patient Details  Name: MANCIL PFENNING MRN: [id number] Date of Birth: [birth year] Today's Date: 06/29/2012  Time: 1400-1500 60 minutes  No c/o pain.  Treatment session focused on pt sustaining attention to task of ballroom dancing for balance training.  Pt able to "teach" PT to dance with min A for balance, max cuing for attention and organization of thoughts.  Pt max-total A for alternating attention, min A for sustained attention to task.  Gait training without AD in controlled environment with min A.  Occasional mod A needed for balance with gait while in household environments.  Pt tends to "furniture walk" and requires cues for safety.  Overall pt with improving balance reactions, limited by impaired safety awareness, decreased organization of thoughts and impaired attention.  Individual therapy   Damante Spragg 06/29/2012, 4:16 PM

## 2012-06-29 NOTE — Progress Notes (Signed)
Subjective/Complaints: Awake, going to the bathroom. A 12 point review of systems has been performed and if not noted above is otherwise negative.   Objective: Vital Signs: Blood pressure 119/69, pulse 78, temperature 98.2 F (36.8 C), temperature source Oral, resp. rate 19, height 5\' 8"  (1.727 m), weight 81.149 kg (178 lb 14.4 oz), SpO2 97.00%. No results found.  Basename 06/28/12 0605  WBC 7.9  HGB 10.0*  HCT 30.0*  PLT 115*    Basename 06/28/12 0605 06/27/12 0535  NA 140 138  K 3.5 3.7  CL 102 102  GLUCOSE 100* 107*  BUN 24* 23  CREATININE 0.98 1.04  CALCIUM 8.9 8.9   CBG (last 3)   Basename 06/26/12 1936 06/26/12 1537 06/26/12 1329  GLUCAP 141* 109* 130*    Wt Readings from Last 3 Encounters:  06/27/12 81.149 kg (178 lb 14.4 oz)  06/27/12 79.9 kg (176 lb 2.4 oz)  09/08/09 87.091 kg (192 lb)    Physical Exam:  Constitutional: He appears well-developed and well-nourished.  79 year old white male in NAD  HENT:  Head: Normocephalic.  Right Ear: External ear normal.  Left Ear: External ear normal.  Eyes: Conjunctivae normal and EOM are normal. Pupils are equal, round, and reactive to light.  Pupils round and reactive to light  Neck: Neck supple. No thyromegaly present.  Cardiovascular: Normal rate, regular rhythm and normal heart sounds. Exam reveals no friction rub.  No murmur heard. Cardiac rate is controlled  Pulmonary/Chest: Effort normal and breath sounds normal. He has no wheezes. He has no rales.  Abdominal: Bowel sounds are normal. He exhibits no distension.  Musculoskeletal: He exhibits no edema.  Neurological: He is alert.  Patient was oriented to [place].   Still with Delayed processing. Occasionally perseverative, sometimes quite distracted. Speech sometimes dysarthric and difficult to understand. Patient would follow simple commands. Moves all 4's with at least 4/5 strength. . Lower extremities seem limited somewhat by pain, especially the  RLE, but inconsistent. No gross sensory finding.  Skin: Skin is warm and dry.  Abrasion near the right knee healing   Assessment/Plan: 1. Functional deficits secondary to left frontoparietal subdural hematoma s/p subdural drain//premorbid dementia  which require 3+ hours per day of interdisciplinary therapy in a comprehensive inpatient rehab setting. Physiatrist is providing close team supervision and 24 hour management of active medical problems listed below. Physiatrist and rehab team continue to assess barriers to discharge/monitor patient progress toward functional and medical goals.  Given age and premorbid dementia, his prognosis for cognitive improvement is fair only   FIM: FIM - Bathing Bathing Steps Patient Completed: Chest;Right Arm;Left Arm;Abdomen;Right upper leg;Left upper leg Bathing: 3: Mod-Patient completes 5-7 17f 10 parts or 50-74%  FIM - Upper Body Dressing/Undressing Upper body dressing/undressing steps patient completed: Thread/unthread left sleeve of pullover shirt/dress;Pull shirt over trunk Upper body dressing/undressing: 3: Mod-Patient completed 50-74% of tasks FIM - Lower Body Dressing/Undressing Lower body dressing/undressing steps patient completed: Thread/unthread left underwear leg;Thread/unthread right underwear leg Lower body dressing/undressing: 2: Max-Patient completed 25-49% of tasks        FIM - Bed/Chair Transfer Bed/Chair Transfer: 4: Bed > Chair or W/C: Min A (steadying Pt. > 75%);4: Chair or W/C > Bed: Min A (steadying Pt. > 75%)  FIM - Locomotion: Wheelchair Locomotion: Wheelchair: 0: Activity did not occur FIM - Locomotion: Ambulation Ambulation/Gait Assistance: 1: +2 Total assist Locomotion: Ambulation: 0: Activity did not occur  Comprehension Comprehension Mode: Auditory Comprehension: 5-Follows basic conversation/direction: With extra time/assistive device  Expression Expression Mode: Verbal Expression: 3-Expresses basic 50 -  74% of the time/requires cueing 25 - 50% of the time. Needs to repeat parts of sentences.  Social Interaction Social Interaction: 3-Interacts appropriately 50 - 74% of the time - May be physically or verbally inappropriate.  Problem Solving Problem Solving: 2-Solves basic 25 - 49% of the time - needs direction more than half the time to initiate, plan or complete simple activities  Memory Memory: 2-Recognizes or recalls 25 - 49% of the time/requires cueing 51 - 75% of the time Medical Problem List and Plan:  1. Left frontoparietal subdural hematoma status post subdural drain  2. DVT Prophylaxis/Anticoagulation: SCDs. Monitor for any signs of DVT  3. Mood/mild dementia. Continue Aricept. Not at baseline per family 4. Neuropsych: This patient is not capable of making decisions on his/her own behalf.  5. Atrial fibrillation. Coumadin reversed and discontinued secondary to subdural hematoma. Cardiac rate control.  6. Hypertension. Cardura 2 mg each bedtime ,hydrochlorothiazide 12.5 mg daily, Lopressor 25 mg twice a day. Generally good control 7. BPH. Proscar 5 mg each bedtime. Check PVRs x3.  8. Hypothyroidism. Synthroid  9. Hyperlipidemia. Zocor   LOS (Days) 2 A FACE TO FACE EVALUATION WAS PERFORMED  Zsofia Prout T 06/29/2012 6:51 AM

## 2012-06-29 NOTE — Progress Notes (Signed)
[facility] Individual Statement of Services  Patient Name:  [name]  Date:  06/29/2012  Welcome to the [facility].  Our goal is to provide you with an individualized program based on your diagnosis and situation, designed to meet your specific needs.  With this comprehensive rehabilitation program, you will be expected to participate in at least 3 hours of rehabilitation therapies Monday-Friday, with modified therapy programming on the weekends.  Your rehabilitation program will include the following services:  Physical Therapy (PT), Occupational Therapy (OT), Speech Therapy (ST), 24 hour per day rehabilitation nursing, Therapeutic Recreaction (TR), Neuropsychology, Case Management (Social Worker), Rehabilitation Medicine, Nutrition Services and Pharmacy Services  Weekly team conferences will be held on Tuesdays to discuss your progress.  Your  Social Worker will talk with you frequently to get your input and to update you on team discussions.  Team conferences with you and your family in attendance may also be held.  Expected length of stay: 2 weeks  Overall anticipated outcome: supervision  Depending on your progress and recovery, your program may change.  Your  Social Worker will coordinate services and will keep you informed of any changes.  Your Social Worker's name and contact numbers are listed  below.  The following services may also be recommended but are not provided by the [facility]:   Driving Evaluations  Home Health Rehabiltiation Services  Outpatient Rehabilitatation [facility]  Vocational Rehabilitation   Arrangements will be made to provide these services after discharge if needed.  Arrangements include referral to agencies that provide these services.  Your insurance has been verified to be:  Medicare and [employer] Your primary doctor is:  Dr. Donette Larry  Pertinent information will be shared with  your doctor and your insurance company.   Social Worker:  [place], [place] [phone] or [phone]  Information discussed with and copy given to patient by: Amada Jupiter, 06/29/2012, 1:01 PM

## 2012-06-29 NOTE — Progress Notes (Signed)
Speech Language Pathology Daily Session Note  Patient Details  Name: [name] MRN: [id number] Date of Birth: 08/09/1919  Today's Date: 06/29/2012 Time: 7829-5621 Time Calculation (min): 50 min  Short Term Goals: Week 1: SLP Short Term Goal 1 (Week 1): Pt will self-monitor and correct naming errors with supervision question and semantic cues.  SLP Short Term Goal 2 (Week 1): Pt will demonstrate orientation to time and siutation with Min A visual and semantic cues. SLP Short Term Goal 3 (Week 1): Pt will demonstrate functional problem solving for basic and familiar tasks with Mod A verbal and question cues.  SLP Short Term Goal 4 (Week 1): Pt will demonstrate sustained attention to a functional task for 15 mins with Min A verbal cues for redirection.  SLP Short Term Goal 5 (Week 1): Pt will maintain a topic during a functional conversation for ~4 turns with Min A verbal and question cues for redirection.  SLP Short Term Goal 6 (Week 1): Pt will identify 1 physical and 1 cognitive deficit with Mod A question and semantic cues.   Skilled Therapeutic Interventions: Skilled treatment session focused on addressing cognitive goals. Patient demonstrated orientation to self and place with increased wait time.  SLP provided mod assist verbal and visual cues to utilize external aids for orientation to situation and time.  Patient's wife present throughout session and SLP initiated education.    FIM:  Comprehension Comprehension Mode: Auditory Comprehension: 5-Understands basic 90% of the time/requires cueing < 10% of the time Expression Expression Mode: Verbal Expression: 3-Expresses basic 50 - 74% of the time/requires cueing 25 - 50% of the time. Needs to repeat parts of sentences. Social Interaction Social Interaction: 4-Interacts appropriately 75 - 89% of the time - Needs redirection for appropriate language or to initiate interaction. Problem Solving Problem Solving: 2-Solves basic 25  - 49% of the time - needs direction more than half the time to initiate, plan or complete simple activities Memory Memory: 2-Recognizes or recalls 25 - 49% of the time/requires cueing 51 - 75% of the time  Pain Pain Assessment Pain Assessment: No/denies pain  Therapy/Group: Individual Therapy  Charlane Ferretti., CCC-SLP [phone]  [name] 06/29/2012, 4:58 PM

## 2012-06-29 NOTE — Progress Notes (Signed)
Occupational Therapy Session Note  Patient Details  Name: [name] MRN: [id number] Date of Birth: 02/04/1920  Today's Date: 06/29/2012 Time: 0800-0900 Time Calculation (min): 60 min  Short Term Goals: Week 1:  OT Short Term Goal 1 (Week 1): Pt will alert staff of toileting needs 50% of the time with min cuing  OT Short Term Goal 2 (Week 1): Pt would demonstrate sustained attention in ADL tasks for 15 min  OT Short Term Goal 3 (Week 1): Pt will orient self with min questioning cuing wtih external aids OT Short Term Goal 4 (Week 1): Pt will perform toileting with min A   Skilled Therapeutic Interventions/Progress Updates:    Pt in bed resting upon arrival but agreeable to engaging in bathing at shower level and dressing with sit<>stand from EOB.  Pt required tot A for orientation to place and situation.  Pt engaged in tangental conversation throughout session and required verbal cues for redirection to task when bathing.  Pt exhibited difficulty donning pull over shirt especially with threading right arm into sleeve.  Pt attempted to thread left arm into right sleeve after left arm already in left sleeve.  Pt required tot A to reorient shirt.  Focus on sequencing, task initiation, attention to task, orientation, and safety awareness.  Therapy Documentation Precautions:  Precautions Precautions: Fall Restrictions Weight Bearing Restrictions: No Pain: Pain Assessment Pain Assessment: 0-10 Pain Score:   4 Pain Type: Acute pain Pain Location: Foot Pain Orientation: Right;Left Pain Descriptors: Aching Pain Intervention(s): Repositioned;RN made aware  See FIM for current functional status  Therapy/Group: Individual Therapy  Rich Brave 06/29/2012, 9:11 AM

## 2012-06-29 NOTE — Plan of Care (Signed)
Problem: RH BLADDER ELIMINATION Goal: RH STG MANAGE BLADDER WITH ASSISTANCE STG Manage Bladder With Min Assistance  Outcome: Not Progressing Patient requires max cues to toilet  Problem: RH SAFETY Goal: RH STG ADHERE TO SAFETY PRECAUTIONS W/ASSISTANCE/DEVICE STG Adhere to Safety Precautions With Min Assistance/Device.  Outcome: Not Progressing Patient requires max cues for safety

## 2012-06-29 NOTE — Progress Notes (Signed)
Occupational Therapy Session Note  Patient Details  Name: [name] MRN: [id number] Date of Birth: 07-19-1919  Today's Date: 06/29/2012 Time: 4098-1191 Time Calculation (min): 45 min  Short Term Goals: Week 1:  OT Short Term Goal 1 (Week 1): Pt will alert staff of toileting needs 50% of the time with min cuing  OT Short Term Goal 2 (Week 1): Pt would demonstrate sustained attention in ADL tasks for 15 min  OT Short Term Goal 3 (Week 1): Pt will orient self with min questioning cuing wtih external aids OT Short Term Goal 4 (Week 1): Pt will perform toileting with min A   Skilled Therapeutic Interventions/Progress Updates:    1:1 focus on functional ambulation to bathroom with RW, toileting with steady A, pt had been incontinent but unaware. simple task of making muffins in kitchen with focus on maintaining sustained attention in quiet environment with mod A to stop extra talking, dynamic balance without RW/ UE support, following one step directions, simple problem solving, functional ambulation without AD, orientation with external aid with max cuing.   Therapy Documentation Precautions:  Precautions Precautions: Fall Restrictions Weight Bearing Restrictions: No Pain: Pain Assessment Pain Assessment: No/denies pain Pain Score:   4 Pain Type: Acute pain Pain Location: Foot Pain Orientation: Right;Left Pain Descriptors: Aching Pain Onset: On-going Pain Intervention(s): Distraction;Ambulation/increased activity  See FIM for current functional status  Therapy/Group: Individual Therapy  [name] [facility] 06/29/2012, 2:20 PM

## 2012-06-30 ENCOUNTER — Encounter (HOSPITAL_COMMUNITY): Payer: Medicare Other | Admitting: Occupational Therapy

## 2012-06-30 ENCOUNTER — Inpatient Hospital Stay (HOSPITAL_COMMUNITY): Payer: Medicare Other | Admitting: Physical Therapy

## 2012-06-30 ENCOUNTER — Inpatient Hospital Stay (HOSPITAL_COMMUNITY): Payer: Medicare Other | Admitting: Speech Pathology

## 2012-06-30 DIAGNOSIS — W19XXXA Unspecified fall, initial encounter: Secondary | ICD-10-CM

## 2012-06-30 DIAGNOSIS — F0390 Unspecified dementia without behavioral disturbance: Secondary | ICD-10-CM

## 2012-06-30 DIAGNOSIS — S065X9A Traumatic subdural hemorrhage with loss of consciousness of unspecified duration, initial encounter: Secondary | ICD-10-CM

## 2012-06-30 MED ORDER — METHYLPREDNISOLONE 4 MG PO KIT
4.0000 mg | PACK | Freq: Four times a day (QID) | ORAL | Status: DC
  Administered 2012-07-02 – 2012-07-04 (×8): 4 mg via ORAL

## 2012-06-30 MED ORDER — METHYLPREDNISOLONE 4 MG PO KIT
4.0000 mg | PACK | Freq: Three times a day (TID) | ORAL | Status: AC
  Administered 2012-07-01 (×3): 4 mg via ORAL

## 2012-06-30 MED ORDER — TRAZODONE HCL 50 MG PO TABS
25.0000 mg | ORAL_TABLET | Freq: Every evening | ORAL | Status: DC | PRN
  Administered 2012-06-30 (×2): 25 mg via ORAL
  Administered 2012-07-01 – 2012-07-06 (×4): 50 mg via ORAL
  Filled 2012-06-30 (×5): qty 1

## 2012-06-30 MED ORDER — METHYLPREDNISOLONE 4 MG PO KIT
4.0000 mg | PACK | ORAL | Status: AC
  Administered 2012-06-30: 4 mg via ORAL

## 2012-06-30 MED ORDER — METHYLPREDNISOLONE 4 MG PO KIT
8.0000 mg | PACK | Freq: Every morning | ORAL | Status: AC
  Administered 2012-06-30: 8 mg via ORAL
  Filled 2012-06-30: qty 21

## 2012-06-30 MED ORDER — METHYLPREDNISOLONE 4 MG PO KIT
8.0000 mg | PACK | Freq: Every evening | ORAL | Status: AC
  Administered 2012-07-01: 8 mg via ORAL

## 2012-06-30 MED ORDER — METHYLPREDNISOLONE 4 MG PO KIT
8.0000 mg | PACK | Freq: Every evening | ORAL | Status: AC
  Administered 2012-06-30: 8 mg via ORAL

## 2012-06-30 NOTE — Progress Notes (Signed)
Physical Therapy Session Note  Patient Details  Name: [name] MRN: [id number] Date of Birth: 1920/03/24  Today's Date: 06/30/2012 Time: 1300-1330 Time Calculation (min): 30 min  Short Term Goals: Week 1:  PT Short Term Goal 1 (Week 1): pt will demonstrate emergent awareness in functional sittuation with mod A PT Short Term Goal 2 (Week 1): Pt will gait in controlled environment 150' with min A PT Short Term Goal 3 (Week 1): Pt will demo dynamic standing balance with min A for functional task   Therapy Documentation Precautions:  Precautions Precautions: Fall Restrictions Weight Bearing Restrictions: No Pain: Pain Assessment Pain Assessment: no c/o pain at rest, however, c/o R > L mid foot pain with wt bearing/ambulation rated at 5 to 10/10.  Gait Training:(15') using RW x 150' with S/Mod-I and x 50' without assistive device and with SBA  Therapeutic Exercise:(15') Nu-Step x 10 minutes, level 4  See FIM for current functional status  Therapy/Group: Individual Therapy  Emerald Shor J 06/30/2012, 1:05 PM

## 2012-06-30 NOTE — Progress Notes (Addendum)
Patient ID: [name], male   DOB: April 25, 1920, 79 y.o.   MRN: [id number] Subjective/Complaints: L>R toe pain No trauma Awake and alert doesn't remember name of rehab MD.  Oriented to [facility] and to Rehab A 12 point review of systems has been performed and if not noted above is otherwise negative.   Objective: Vital Signs: Blood pressure 125/74, pulse 75, temperature 98.3 F (36.8 C), temperature source Oral, resp. rate 18, height 5\' 8"  (1.727 m), weight 81.149 kg (178 lb 14.4 oz), SpO2 94.00%. No results found.  Basename 06/28/12 0605  WBC 7.9  HGB 10.0*  HCT 30.0*  PLT 115*    Basename 06/28/12 0605  NA 140  K 3.5  CL 102  GLUCOSE 100*  BUN 24*  CREATININE 0.98  CALCIUM 8.9   CBG (last 3)  No results found for this basename: GLUCAP:3 in the last 72 hours  Wt Readings from Last 3 Encounters:  06/27/12 81.149 kg (178 lb 14.4 oz)  06/27/12 79.9 kg (176 lb 2.4 oz)  09/08/09 87.091 kg (192 lb)    Physical Exam:  Constitutional: He appears well-developed and well-nourished.  79 year old white male in NAD  HENT:  Head: Normocephalic.  Right Ear: External ear normal.  Left Ear: External ear normal.  Eyes: Conjunctivae normal and EOM are normal. Pupils are equal, round, and reactive to light.  Pupils round and reactive to light  Neck: Neck supple. No thyromegaly present.  Cardiovascular: Normal rate, regular rhythm and normal heart sounds. Exam reveals no friction rub.  No murmur heard. Cardiac rate is controlled  Pulmonary/Chest: Effort normal and breath sounds normal. He has no wheezes. He has no rales.  Abdominal: Bowel sounds are normal. He exhibits no distension.  Musculoskeletal: He exhibits pedal edema. L 1st MTP erythema and tenderness Neurological: He is alert.  Patient was oriented to [place].   Still with Delayed processing. Occasionally perseverative, sometimes quite distracted. Speech sometimes dysarthric and difficult to understand. Patient  would follow simple commands. Moves all 4's with at least 4/5 strength. . Lower extremities seem limited somewhat by pain, especially the RLE, but inconsistent. No gross sensory finding.  Skin: Skin is warm and dry.  Abrasion near the right knee healing   Assessment/Plan: 1. Functional deficits secondary to left frontoparietal subdural hematoma s/p subdural drain//premorbid dementia  which require 3+ hours per day of interdisciplinary therapy in a comprehensive inpatient rehab setting. Physiatrist is providing close team supervision and 24 hour management of active medical problems listed below. Physiatrist and rehab team continue to assess barriers to discharge/monitor patient progress toward functional and medical goals.  Given age and premorbid dementia, his prognosis for cognitive improvement is fair only   FIM: FIM - Bathing Bathing Steps Patient Completed: Chest;Right Arm;Left Arm;Abdomen;Front perineal area;Buttocks;Right upper leg;Left upper leg Bathing: 4: Min-Patient completes 8-9 68f 10 parts or 75+ percent  FIM - Upper Body Dressing/Undressing Upper body dressing/undressing steps patient completed: Pull shirt over trunk;Put head through opening of pull over shirt/dress;Thread/unthread right sleeve of pullover shirt/dresss Upper body dressing/undressing: 4: Min-Patient completed 75 plus % of tasks FIM - Lower Body Dressing/Undressing Lower body dressing/undressing steps patient completed: Thread/unthread right pants leg;Thread/unthread left pants leg;Pull pants up/down;Fasten/unfasten pants;Don/Doff right sock;Don/Doff left sock Lower body dressing/undressing: 4: Steadying Assist  FIM - Toileting Toileting steps completed by patient: Adjust clothing prior to toileting Toileting Assistive Devices: Grab bar or rail for support Toileting: 2: Max-Patient completed 1 of 3 steps  FIM - Archivist Transfers:  4-To toilet/BSC: Min A (steadying Pt. > 75%)  FIM -  Bed/Chair Transfer Bed/Chair Transfer: 4: Bed > Chair or W/C: Min A (steadying Pt. > 75%);4: Chair or W/C > Bed: Min A (steadying Pt. > 75%)  FIM - Locomotion: Wheelchair Locomotion: Wheelchair: 0: Activity did not occur FIM - Locomotion: Ambulation Locomotion: Ambulation Assistive Devices: Designer, industrial/product Ambulation/Gait Assistance: 4: Min assist Locomotion: Ambulation: 4: Travels 150 ft or more with minimal assistance (Pt.>75%)  Comprehension Comprehension Mode: Auditory Comprehension: 5-Understands basic 90% of the time/requires cueing < 10% of the time  Expression Expression Mode: Verbal Expression: 3-Expresses basic 50 - 74% of the time/requires cueing 25 - 50% of the time. Needs to repeat parts of sentences.  Social Interaction Social Interaction: 4-Interacts appropriately 75 - 89% of the time - Needs redirection for appropriate language or to initiate interaction.  Problem Solving Problem Solving: 2-Solves basic 25 - 49% of the time - needs direction more than half the time to initiate, plan or complete simple activities  Memory Memory: 2-Recognizes or recalls 25 - 49% of the time/requires cueing 51 - 75% of the time Medical Problem List and Plan:  1. Left frontoparietal subdural hematoma status post subdural drain  2. DVT Prophylaxis/Anticoagulation: SCDs. Monitor for any signs of DVT  3. Mood/mild dementia. Continue Aricept. Not at baseline per family 4. Neuropsych: This patient is not capable of making decisions on his/her own behalf.  5. Atrial fibrillation. Coumadin reversed and discontinued secondary to subdural hematoma. Cardiac rate control.  6. Hypertension. Cardura 2 mg each bedtime ,hydrochlorothiazide 12.5 mg daily, Lopressor 25 mg twice a day. Generally good control 7. BPH. Proscar 5 mg each bedtime. Check PVRs x3.  8. Hypothyroidism. Synthroid  9. Hyperlipidemia. Zocor 10.  Probable gout  Urate, start treatment  LOS (Days) 3 A FACE TO FACE EVALUATION WAS  PERFORMED  Statia Burdick E 06/30/2012 8:05 AM

## 2012-06-30 NOTE — Progress Notes (Signed)
Occupational Therapy Session Note  Patient Details  Name: [name] MRN: [id number] Date of Birth: 18-Jun-1920  Today's Date: 06/30/2012 Time: 0865-7846 Time Calculation (min): 45 min  Short Term Goals: Week 1:  OT Short Term Goal 1 (Week 1): Pt will alert staff of toileting needs 50% of the time with min cuing  OT Short Term Goal 2 (Week 1): Pt would demonstrate sustained attention in ADL tasks for 15 min  OT Short Term Goal 3 (Week 1): Pt will orient self with min questioning cuing wtih external aids OT Short Term Goal 4 (Week 1): Pt will perform toileting with min A   Skilled Therapeutic Interventions/Progress Updates:    1:1 self care retraining at shower level with focus on functional ambulation around room with HHA (no AD), sit to stand, standing balance without UE support, sequencing familiar dressing and grooming tasks with environmental cues and min questioning cuing, sustained attention with min a pt self distracts self with stories.  Pt oriented to [facility], something happened to by head and "I am here to get better." January Saturday and 2014  Therapy Documentation Precautions:  Precautions Precautions: Fall Restrictions Weight Bearing Restrictions: No Pain:  no c/o pain  See FIM for current functional status  Therapy/Group: Individual Therapy  [name] [facility] 06/30/2012, 8:44 AM

## 2012-06-30 NOTE — Progress Notes (Signed)
Speech Language Pathology Daily Session Note  Patient Details  Name: [name] MRN: [id number] Date of Birth: 25-Jul-1919  Today's Date: 06/30/2012 Time: 1191-4782 Time Calculation (min): 45 min  Short Term Goals: Week 1: SLP Short Term Goal 1 (Week 1): Pt will self-monitor and correct naming errors with supervision question and semantic cues.  SLP Short Term Goal 2 (Week 1): Pt will demonstrate orientation to time and siutation with Min A visual and semantic cues. SLP Short Term Goal 3 (Week 1): Pt will demonstrate functional problem solving for basic and familiar tasks with Mod A verbal and question cues.  SLP Short Term Goal 4 (Week 1): Pt will demonstrate sustained attention to a functional task for 15 mins with Min A verbal cues for redirection.  SLP Short Term Goal 5 (Week 1): Pt will maintain a topic during a functional conversation for ~4 turns with Min A verbal and question cues for redirection.  SLP Short Term Goal 6 (Week 1): Pt will identify 1 physical and 1 cognitive deficit with Mod A question and semantic cues.   Skilled Therapeutic Interventions: Treatment focus on cognitive-linguistic goals. Pt oriented X 4 this morning with supervision question cues. Pt also independently recalled events in previous therapy session with Mod I. Pt participated in word-finding task with focus on utilization of word-finding strategy of description. Pt required Moderate semantic and question cues at beginning of task which then faded to supervision semantic cues by end of task. Pt demonstrated emergent awareness of difficulty of task.    FIM:  Comprehension Comprehension: 4-Understands basic 75 - 89% of the time/requires cueing 10 - 24% of the time Expression Expression: 3-Expresses basic 50 - 74% of the time/requires cueing 25 - 50% of the time. Needs to repeat parts of sentences. Social Interaction Social Interaction: 4-Interacts appropriately 75 - 89% of the time - Needs redirection  for appropriate language or to initiate interaction. Problem Solving Problem Solving: 2-Solves basic 25 - 49% of the time - needs direction more than half the time to initiate, plan or complete simple activities Memory Memory: 3-Recognizes or recalls 50 - 74% of the time/requires cueing 25 - 49% of the time  Pain Pain Assessment Pain Assessment: No/denies pain  Therapy/Group: Individual Therapy  Marcelino Campos 06/30/2012, 9:43 AM

## 2012-06-30 NOTE — Progress Notes (Signed)
Physical Therapy Session Note  Patient Details  Name: [name] MRN: [id number] Date of Birth: [birth year]  Today's Date: 06/30/2012 Time: 1000-1100 Time Calculation (min): 60 min  Short Term Goals: Week 1:  PT Short Term Goal 1 (Week 1): pt will demonstrate emergent awareness in functional sittuation with mod A PT Short Term Goal 2 (Week 1): Pt will gait in controlled environment 150' with min A PT Short Term Goal 3 (Week 1): Pt will demo dynamic standing balance with min A for functional task   Therapy Documentation Precautions:  Precautions Precautions: Fall Restrictions Weight Bearing Restrictions: No Pain: Pain Assessment Pain Assessment: No/denies pain  Therapeutic Activity:(15') Transfer training sit<->stand with S/Mod-I and S/Independent assist.  Dynamic standing balance during functional activities with balance provocation. Therapeutic Exercise:(15') LE exercises in sitting Gait Training:(30') Gait x 250' using RW with S/Mod-I and x 200' without assistive device with close SBA. Up/down 4 (6-inch) steps x 3 using single handrail with S/Independent assist.  Car transfers with S/Mod-I assist.  Therapy/Group: Individual Therapy  Abbygayle Helfand J 06/30/2012, 10:07 AM

## 2012-07-01 ENCOUNTER — Inpatient Hospital Stay (HOSPITAL_COMMUNITY): Payer: Medicare Other | Admitting: *Deleted

## 2012-07-01 NOTE — Progress Notes (Signed)
Physical Therapy Note  Patient Details  Name: [name] MRN: [id number] Date of Birth: Nov 15, 1919 Today's Date: 07/01/2012  1040-11:25 (45 min) Individual session  Gait training ; with FWW 2x 150 feet with Min A for safety and VC for postural control and sequencing, as well as for appropriate velocity(pt ambulates very fast, placing himself at risk for a fall). Gait training w/o AD 2x 75 feet with HHA, varying speed and turning, Tandem walking.  Gait training with maneuvering around obstacles, turns using small steps, navigating through obstacle course. Balance training: Step ups 2x 15 with 50% VC for sequencing and motor planning, static and dynamic standing on foam board. Dynamic standing with crossing mid-line and transferring objects.  Dorna Mai 07/01/2012, 1:19 PM

## 2012-07-01 NOTE — Progress Notes (Signed)
Patient ID: [name], male   DOB: 1920-01-07, 79 y.o.   MRN: [id number] Subjective/Complaints: L>R toe pain improving No trauma Slept well after trazodone A 12 point review of systems has been performed and if not noted above is otherwise negative.   Objective: Vital Signs: Blood pressure 142/81, pulse 88, temperature 97.7 F (36.5 C), temperature source Oral, resp. rate 17, height 5\' 8"  (1.727 m), weight 81.149 kg (178 lb 14.4 oz), SpO2 94.00%. No results found. No results found for this basename: WBC:2,HGB:2,HCT:2,PLT:2 in the last 72 hours No results found for this basename: NA:2,K:2,CL:2,CO:2,GLUCOSE:2,BUN:2,CREATININE:2,CALCIUM:2 in the last 72 hours CBG (last 3)  No results found for this basename: GLUCAP:3 in the last 72 hours  Wt Readings from Last 3 Encounters:  06/27/12 81.149 kg (178 lb 14.4 oz)  06/27/12 79.9 kg (176 lb 2.4 oz)  09/08/09 87.091 kg (192 lb)    Physical Exam:  Constitutional: He appears well-developed and well-nourished.  79 year old white male in NAD  HENT:  Head: Normocephalic.  Right Ear: External ear normal.  Left Ear: External ear normal.  Eyes: Conjunctivae normal and EOM are normal. Pupils are equal, round, and reactive to light.  Pupils round and reactive to light  Neck: Neck supple. No thyromegaly present.  Cardiovascular: Normal rate, regular rhythm and normal heart sounds. Exam reveals no friction rub.  No murmur heard. Cardiac rate is controlled  Pulmonary/Chest: Effort normal and breath sounds normal. He has no wheezes. He has no rales.  Abdominal: Bowel sounds are normal. He exhibits no distension.  Musculoskeletal: He exhibits pedal edema. L 1st MTP erythema and tenderness Neurological: He is alert.  Patient was oriented to [place].   Still with Delayed processing. Occasionally perseverative, sometimes quite distracted. Speech sometimes dysarthric and difficult to understand. Patient would follow simple commands. Moves  all 4's with at least 4/5 strength. . Lower extremities seem limited somewhat by pain, especially the RLE, but inconsistent. No gross sensory finding.  Skin: Skin is warm and dry.  Abrasion near the right knee healing   Assessment/Plan: 1. Functional deficits secondary to left frontoparietal subdural hematoma s/p subdural drain//premorbid dementia  which require 3+ hours per day of interdisciplinary therapy in a comprehensive inpatient rehab setting. Physiatrist is providing close team supervision and 24 hour management of active medical problems listed below. Physiatrist and rehab team continue to assess barriers to discharge/monitor patient progress toward functional and medical goals.  Given age and premorbid dementia, his prognosis for cognitive improvement is fair only   FIM: FIM - Bathing Bathing Steps Patient Completed: Chest;Right Arm;Right lower leg (including foot);Left Arm;Abdomen;Front perineal area;Left lower leg (including foot);Right upper leg;Left upper leg Bathing: 4: Min-Patient completes 8-9 67f 10 parts or 75+ percent  FIM - Upper Body Dressing/Undressing Upper body dressing/undressing steps patient completed: Thread/unthread right sleeve of pullover shirt/dresss;Thread/unthread left sleeve of pullover shirt/dress;Pull shirt over trunk;Put head through opening of pull over shirt/dress Upper body dressing/undressing: 5: Set-up assist to: Apply TLSO, cervical collar FIM - Lower Body Dressing/Undressing Lower body dressing/undressing steps patient completed: Thread/unthread right underwear leg;Thread/unthread left underwear leg;Pull underwear up/down;Thread/unthread left pants leg;Thread/unthread right pants leg;Don/Doff right sock;Don/Doff left sock Lower body dressing/undressing: 4: Min-Patient completed 75 plus % of tasks  FIM - Toileting Toileting steps completed by patient: Adjust clothing prior to toileting;Adjust clothing after toileting Toileting Assistive Devices:  Grab bar or rail for support Toileting: 4: Assist with fasteners  FIM - Archivist Transfers: 4-To toilet/BSC: Min A (steadying Pt. > 75%);4-From  toilet/BSC: Min A (steadying Pt. > 75%)  FIM - Bed/Chair Transfer Bed/Chair Transfer: 4: Supine > Sit: Min A (steadying Pt. > 75%/lift 1 leg);4: Bed > Chair or W/C: Min A (steadying Pt. > 75%);4: Chair or W/C > Bed: Min A (steadying Pt. > 75%)  FIM - Locomotion: Wheelchair Locomotion: Wheelchair: 0: Activity did not occur FIM - Locomotion: Ambulation Locomotion: Ambulation Assistive Devices: Designer, industrial/product Ambulation/Gait Assistance: 4: Min assist Locomotion: Ambulation: 4: Travels 150 ft or more with minimal assistance (Pt.>75%)  Comprehension Comprehension Mode: Auditory Comprehension: 4-Understands basic 75 - 89% of the time/requires cueing 10 - 24% of the time  Expression Expression Mode: Verbal Expression: 3-Expresses basic 50 - 74% of the time/requires cueing 25 - 50% of the time. Needs to repeat parts of sentences.  Social Interaction Social Interaction: 4-Interacts appropriately 75 - 89% of the time - Needs redirection for appropriate language or to initiate interaction.  Problem Solving Problem Solving: 2-Solves basic 25 - 49% of the time - needs direction more than half the time to initiate, plan or complete simple activities  Memory Memory: 3-Recognizes or recalls 50 - 74% of the time/requires cueing 25 - 49% of the time Medical Problem List and Plan:  1. Left frontoparietal subdural hematoma status post subdural drain  2. DVT Prophylaxis/Anticoagulation: SCDs. Monitor for any signs of DVT  3. Mood/mild dementia. Continue Aricept. Not at baseline per family 4. Neuropsych: This patient is not capable of making decisions on his/her own behalf.  5. Atrial fibrillation. Coumadin reversed and discontinued secondary to subdural hematoma. Cardiac rate control.  6. Hypertension. Cardura 2 mg each bedtime  ,hydrochlorothiazide 12.5 mg daily, Lopressor 25 mg twice a day. Generally good control 7. BPH. Proscar 5 mg each bedtime. Check PVRs x3.  8. Hypothyroidism. Synthroid  9. Hyperlipidemia. Zocor 10.  Probable gout  Urate, start treatment  LOS (Days) 4 A FACE TO FACE EVALUATION WAS PERFORMED  Erick Colace 07/01/2012 7:50 AM

## 2012-07-02 ENCOUNTER — Inpatient Hospital Stay (HOSPITAL_COMMUNITY): Payer: Medicare Other | Admitting: Occupational Therapy

## 2012-07-02 ENCOUNTER — Inpatient Hospital Stay (HOSPITAL_COMMUNITY): Payer: Medicare Other | Admitting: Physical Therapy

## 2012-07-02 ENCOUNTER — Inpatient Hospital Stay (HOSPITAL_COMMUNITY): Payer: Medicare Other | Admitting: Speech Pathology

## 2012-07-02 ENCOUNTER — Encounter (HOSPITAL_COMMUNITY): Payer: Medicare Other

## 2012-07-02 DIAGNOSIS — F0390 Unspecified dementia without behavioral disturbance: Secondary | ICD-10-CM

## 2012-07-02 DIAGNOSIS — S065X9A Traumatic subdural hemorrhage with loss of consciousness of unspecified duration, initial encounter: Secondary | ICD-10-CM

## 2012-07-02 DIAGNOSIS — W19XXXA Unspecified fall, initial encounter: Secondary | ICD-10-CM

## 2012-07-02 DIAGNOSIS — I6200 Nontraumatic subdural hemorrhage, unspecified: Secondary | ICD-10-CM

## 2012-07-02 NOTE — Progress Notes (Signed)
Occupational Therapy Session Note  Patient Details  Name: [name] MRN: [id number] Date of Birth: 1919-06-26  Today's Date: 07/02/2012 Time: 7829-5621 Time Calculation (min): 30 min  Short Term Goals: Week 1:  OT Short Term Goal 1 (Week 1): Pt will alert staff of toileting needs 50% of the time with min cuing  OT Short Term Goal 2 (Week 1): Pt would demonstrate sustained attention in ADL tasks for 15 min  OT Short Term Goal 3 (Week 1): Pt will orient self with min questioning cuing wtih external aids OT Short Term Goal 4 (Week 1): Pt will perform toileting with min A   Skilled Therapeutic Interventions/Progress Updates:    Pt performed toilet transfer with min steady assist without use of assistive device.  Pt then ambulated to the dayroom to work on dynamic balance activities to increase safety with selfcare tasks.  Focused pt on reaching to various heights including the floor with alternating UEs in order to pick up rings to then toss on to grid.  Pt able to reach down to the floor multiple times to retrieve the rings without any loss of balance.  During task pt needed max questioning cues to remember total of points he scored with each interval.  Wife present during session and reports that pt does not take care of the bills at home and was not a "math"  person before this admission. Therapy Documentation Precautions:  Precautions Precautions: Fall Restrictions Weight Bearing Restrictions: No  Pain: Pain Assessment Pain Assessment: No/denies pain  ADL: See FIM for current functional status  Therapy/Group: Individual Therapy  Carla Whilden 07/02/2012, 3:32 PM

## 2012-07-02 NOTE — Progress Notes (Signed)
Physical Therapy Note  Patient Details  Name: [name] MRN: [id number] Date of Birth: 1920-05-11 Today's Date: 07/02/2012  Time: 1000-11000 60 minutes  No c/o pain. Treatment focused on community mobility with pt ambulating in gift shop with min/steadying assist.  Pt to recall and find 5 objects in gift shop using list.  Pt required min cuing to use list and min cuing for attention to task in distracting environment.  Gait in hallways looking at artwork with pt able to side step and gait with close supervision, pt requires min A for backward walking due to delayed balance reactions.  Pt with improving balance and gait, continues to require cues for sustained attention as he tends to self distract.  Individual therapy  Duyen Beckom 07/02/2012, 10:58 AM

## 2012-07-02 NOTE — Progress Notes (Addendum)
Occupational Therapy Session Note  Patient Details  Name: ABDOU STOCKS MRN: [id number] Date of Birth: Oct 07, 1919  Today's Date: 07/02/2012 Time: 0800-0903 Time Calculation (min): 63 min  Short Term Goals: Week 1:  OT Short Term Goal 1 (Week 1): Pt will alert staff of toileting needs 50% of the time with min cuing  OT Short Term Goal 2 (Week 1): Pt would demonstrate sustained attention in ADL tasks for 15 min  OT Short Term Goal 3 (Week 1): Pt will orient self with min questioning cuing wtih external aids OT Short Term Goal 4 (Week 1): Pt will perform toileting with min A   Skilled Therapeutic Interventions/Progress Updates:    Bathing and dressing session.  Pt gathered clothes with mod questioning cues and min steady assist for balance without use of assistive device.  He was able to perform bathing sit to stand in the shower with min steady assist for balance when washing peri area.  Needed mod instructional cueing to sequence bathing as well.  Pt also required mod questioning cues to perform dressing tasks.  Therapist assisted with donning TEDs and providing min instructional cueing for hand placement with sit to stand transitions.  During session pt was oriented to place and also demonstrated some intellectual awareness of his situation including "blood in his head" as he expressed it.  Therapy Documentation Precautions:  Precautions Precautions: Fall Restrictions Weight Bearing Restrictions: No  Vital Signs: Therapy Vitals Pulse Rate: 82  BP: 110/58 mmHg Pain: Pain Assessment Pain Assessment: No/denies pain ADL: See FIM for current functional status  Therapy/Group: Individual Therapy  Keta [place]val[place] OTR/L Pager number [phone] 07/02/2012, 12:15 PM

## 2012-07-02 NOTE — Progress Notes (Signed)
Patient ID: [name], male   DOB: [birth year], 79 y.o.   MRN: [id number] Subjective/Complaints: Pain better. Pleasantly confused. Calm in bed  A 12 point review of systems has been performed and if not noted above is otherwise negative.   Objective: Vital Signs: Blood pressure 100/62, pulse 71, temperature 98.2 F (36.8 C), temperature source Oral, resp. rate 18, height 5\' 8"  (1.727 m), weight 81.149 kg (178 lb 14.4 oz), SpO2 93.00%. No results found. No results found for this basename: WBC:2,HGB:2,HCT:2,PLT:2 in the last 72 hours No results found for this basename: NA:2,K:2,CL:2,CO:2,GLUCOSE:2,BUN:2,CREATININE:2,CALCIUM:2 in the last 72 hours CBG (last 3)  No results found for this basename: GLUCAP:3 in the last 72 hours  Wt Readings from Last 3 Encounters:  06/27/12 81.149 kg (178 lb 14.4 oz)  06/27/12 79.9 kg (176 lb 2.4 oz)  09/08/09 87.091 kg (192 lb)    Physical Exam:  Constitutional: He appears well-developed and well-nourished.  79 year old white male in NAD  HENT:  Head: Normocephalic.  Right Ear: External ear normal.  Left Ear: External ear normal.  Eyes: Conjunctivae normal and EOM are normal. Pupils are equal, round, and reactive to light.  Pupils round and reactive to light  Neck: Neck supple. No thyromegaly present.  Cardiovascular: Normal rate, regular rhythm and normal heart sounds. Exam reveals no friction rub.  No murmur heard. Cardiac rate is controlled  Pulmonary/Chest: Effort normal and breath sounds normal. He has no wheezes. He has no rales.  Abdominal: Bowel sounds are normal. He exhibits no distension.  Musculoskeletal: He exhibits pedal edema. L 1st MTP erythema and tenderness is decreased Neurological: He is alert.  Patient was oriented to self.   Still with Delayed processing.   Speech sometimes dysarthric and difficult to understand. Patient would follow simple commands. Moves all 4's with at least 4/5 strength. . Lower extremities seem  limited somewhat by pain, especially the RLE, but inconsistent. No gross sensory finding.  Skin: Skin is warm and dry.  Abrasion near the right knee healing   Assessment/Plan: 1. Functional deficits secondary to left frontoparietal subdural hematoma s/p subdural drain//premorbid dementia  which require 3+ hours per day of interdisciplinary therapy in a comprehensive inpatient rehab setting. Physiatrist is providing close team supervision and 24 hour management of active medical problems listed below. Physiatrist and rehab team continue to assess barriers to discharge/monitor patient progress toward functional and medical goals.  Given age and premorbid dementia, his prognosis for cognitive improvement is fair only   FIM: FIM - Bathing Bathing Steps Patient Completed: Right Arm;Left Arm;Chest;Front perineal area;Abdomen Bathing: 3: Mod-Patient completes 5-7 15f 10 parts or 50-74%  FIM - Upper Body Dressing/Undressing Upper body dressing/undressing steps patient completed: Thread/unthread right sleeve of pullover shirt/dresss;Thread/unthread left sleeve of pullover shirt/dress;Put head through opening of pull over shirt/dress;Pull shirt over trunk Upper body dressing/undressing: 5: Set-up assist to: Obtain clothing/put away FIM - Lower Body Dressing/Undressing Lower body dressing/undressing steps patient completed: Thread/unthread right pants leg;Thread/unthread left pants leg;Pull pants up/down;Don/Doff left sock;Don/Doff right shoe Lower body dressing/undressing: 4: Min-Patient completed 75 plus % of tasks  FIM - Toileting Toileting steps completed by patient: Adjust clothing prior to toileting;Adjust clothing after toileting Toileting Assistive Devices: Grab bar or rail for support Toileting: 4: Assist with fasteners  FIM - Archivist Transfers: 4-To toilet/BSC: Min A (steadying Pt. > 75%);4-From toilet/BSC: Min A (steadying Pt. > 75%)  FIM - Bed/Chair  Transfer Bed/Chair Transfer: 4: Supine > Sit: Min A (steadying Pt. > 75%/lift  1 leg);4: Bed > Chair or W/C: Min A (steadying Pt. > 75%);4: Chair or W/C > Bed: Min A (steadying Pt. > 75%)  FIM - Locomotion: Wheelchair Locomotion: Wheelchair: 0: Activity did not occur FIM - Locomotion: Ambulation Locomotion: Ambulation Assistive Devices: Designer, industrial/product Ambulation/Gait Assistance: 4: Min assist Locomotion: Ambulation: 4: Travels 150 ft or more with minimal assistance (Pt.>75%)  Comprehension Comprehension Mode: Auditory Comprehension: 4-Understands basic 75 - 89% of the time/requires cueing 10 - 24% of the time  Expression Expression Mode: Verbal Expression: 3-Expresses basic 50 - 74% of the time/requires cueing 25 - 50% of the time. Needs to repeat parts of sentences.  Social Interaction Social Interaction: 4-Interacts appropriately 75 - 89% of the time - Needs redirection for appropriate language or to initiate interaction.  Problem Solving Problem Solving: 2-Solves basic 25 - 49% of the time - needs direction more than half the time to initiate, plan or complete simple activities  Memory Memory: 3-Recognizes or recalls 50 - 74% of the time/requires cueing 25 - 49% of the time Medical Problem List and Plan:  1. Left frontoparietal subdural hematoma status post subdural drain  2. DVT Prophylaxis/Anticoagulation: SCDs. Monitor for any signs of DVT  3. Mood/mild dementia. Continue Aricept. Not at baseline per family 4. Neuropsych: This patient is not capable of making decisions on his/her own behalf.  5. Atrial fibrillation. Coumadin reversed and discontinued secondary to subdural hematoma. Cardiac rate control.  6. Hypertension. Cardura 2 mg each bedtime ,hydrochlorothiazide 12.5 mg daily, Lopressor 25 mg twice a day. Generally good control 7. BPH. Proscar 5 mg each bedtime. Check PVRs x3.  8. Hypothyroidism. Synthroid  9. Hyperlipidemia. Zocor 10.  Gout- medrol dose pack.  improving  LOS (Days) 5 A FACE TO FACE EVALUATION WAS PERFORMED  Adekunle Rohrbach T 07/02/2012 7:15 AM

## 2012-07-02 NOTE — Progress Notes (Signed)
Speech Language Pathology Daily Session Note  Patient Details  Name: [name] MRN: [id number] Date of Birth: 05/18/1920  Today's Date: 07/02/2012 Time: 0905-1000 Time Calculation (min): 55 min  Short Term Goals: Week 1: SLP Short Term Goal 1 (Week 1): Pt will self-monitor and correct naming errors with supervision question and semantic cues.  SLP Short Term Goal 2 (Week 1): Pt will demonstrate orientation to time and siutation with Min A visual and semantic cues. SLP Short Term Goal 3 (Week 1): Pt will demonstrate functional problem solving for basic and familiar tasks with Mod A verbal and question cues.  SLP Short Term Goal 4 (Week 1): Pt will demonstrate sustained attention to a functional task for 15 mins with Min A verbal cues for redirection.  SLP Short Term Goal 5 (Week 1): Pt will maintain a topic during a functional conversation for ~4 turns with Min A verbal and question cues for redirection.  SLP Short Term Goal 6 (Week 1): Pt will identify 1 physical and 1 cognitive deficit with Mod A question and semantic cues.   Skilled Therapeutic Interventions: Treatment focus on cognitive goals. Pt participated in organization, problem solving and sequencing task with 4 and 6 step picture cards. Pt required Min A visual and semantic cues to self-monitor and correct errors. Pt also participated in description task with the pictures and required Min A semantic and question cues to utilize word-finding strategies at the sentence level. Pt demonstrated increased selective attention to task and required supervision verbal cues for redirection to task for ~45 minutes.    FIM:  Comprehension Comprehension Mode: Auditory Comprehension: 5-Understands basic 90% of the time/requires cueing < 10% of the time Expression Expression Mode: Verbal Expression: 4-Expresses basic 75 - 89% of the time/requires cueing 10 - 24% of the time. Needs helper to occlude trach/needs to repeat words. Social  Interaction Social Interaction: 5-Interacts appropriately 90% of the time - Needs monitoring or encouragement for participation or interaction. Problem Solving Problem Solving: 4-Solves basic 75 - 89% of the time/requires cueing 10 - 24% of the time Memory Memory: 3-Recognizes or recalls 50 - 74% of the time/requires cueing 25 - 49% of the time  Pain Pain Assessment Pain Assessment: No/denies pain  Therapy/Group: Individual Therapy  Becky Berberian 07/02/2012, 11:08 AM

## 2012-07-02 NOTE — Progress Notes (Signed)
Physical Therapy Note  Patient Details  Name: [name] MRN: [id number] Date of Birth: 1919/06/27 Today's Date: 07/02/2012  1300-1355 (55 minutes) individual Pain: RT toe pain/premedicated Focus of treatment: Therapeutic activities focused on standing balance; activity tolerance; gait training Treatment: Gait to/from room to gym RW SBA with vcw not to lean on AD; Nustep Level 4 X 10 minutes for activity tolerance; Gait without AD holding basketball in front to increase trunk extension X 80 feet close SBA; pt pushes AD aside and attempts to ambulate to chair without AD (decreased safety awareness); gait performing dance steps min assist.    [name],JIM 07/02/2012, 7:35 AM

## 2012-07-03 ENCOUNTER — Inpatient Hospital Stay (HOSPITAL_COMMUNITY): Payer: Medicare Other | Admitting: Physical Therapy

## 2012-07-03 ENCOUNTER — Inpatient Hospital Stay (HOSPITAL_COMMUNITY): Payer: Medicare Other | Admitting: Speech Pathology

## 2012-07-03 ENCOUNTER — Inpatient Hospital Stay (HOSPITAL_COMMUNITY): Payer: Medicare Other | Admitting: Occupational Therapy

## 2012-07-03 ENCOUNTER — Encounter (HOSPITAL_COMMUNITY): Payer: Medicare Other | Admitting: Occupational Therapy

## 2012-07-03 NOTE — Progress Notes (Signed)
Patient ID: [name], male   DOB: March 19, 1920, 79 y.o.   MRN: [id number] Subjective/Complaints: Slept fairly well. Doesn't recall seeing me before.  A 12 point review of systems has been performed and if not noted above is otherwise negative.   Objective: Vital Signs: Blood pressure 123/83, pulse 83, temperature 98.8 F (37.1 C), temperature source Oral, resp. rate 19, height 5\' 8"  (1.727 m), weight 81.149 kg (178 lb 14.4 oz), SpO2 93.00%. No results found. No results found for this basename: WBC:2,HGB:2,HCT:2,PLT:2 in the last 72 hours No results found for this basename: NA:2,K:2,CL:2,CO:2,GLUCOSE:2,BUN:2,CREATININE:2,CALCIUM:2 in the last 72 hours CBG (last 3)  No results found for this basename: GLUCAP:3 in the last 72 hours  Wt Readings from Last 3 Encounters:  06/27/12 81.149 kg (178 lb 14.4 oz)  06/27/12 79.9 kg (176 lb 2.4 oz)  09/08/09 87.091 kg (192 lb)    Physical Exam:  Constitutional: He appears well-developed and well-nourished.  NAD. HENT:  Head: Normocephalic.  Right Ear: External ear normal.  Left Ear: External ear normal.  Eyes: Conjunctivae normal and EOM are normal. Pupils are equal, round, and reactive to light.  Pupils round and reactive to light  Neck: Neck supple. No thyromegaly present.  Cardiovascular: Normal rate, regular rhythm and normal heart sounds. Exam reveals no friction rub.  No murmur heard. Cardiac rate is controlled  Pulmonary/Chest: Effort normal and breath sounds normal. He has no wheezes. He has no rales.  Abdominal: Bowel sounds are normal. He exhibits no distension.  Musculoskeletal: He exhibits pedal edema. L 1st MTP erythema and tenderness is decreased Neurological: He is alert.  Patient was oriented to self.   Still with Delayed processing.   Speech sometimes dysarthric and difficult to understand. Patient would follow simple commands. Moves all 4's with at least 4/5 strength. . Lower extremities seem limited somewhat by  pain, especially the RLE, but inconsistent. No gross sensory finding.  Skin: Skin is warm and dry.  Abrasion near the right knee healing   Assessment/Plan: 1. Functional deficits secondary to left frontoparietal subdural hematoma s/p subdural drain//premorbid dementia  which require 3+ hours per day of interdisciplinary therapy in a comprehensive inpatient rehab setting. Physiatrist is providing close team supervision and 24 hour management of active medical problems listed below. Physiatrist and rehab team continue to assess barriers to discharge/monitor patient progress toward functional and medical goals.  Given age and premorbid dementia, his prognosis for cognitive improvement is fair at best   FIM: FIM - Bathing Bathing Steps Patient Completed: Chest;Right Arm;Left Arm;Abdomen;Front perineal area;Right upper leg;Buttocks;Left upper leg;Right lower leg (including foot);Left lower leg (including foot) Bathing: 4: Steadying assist  FIM - Upper Body Dressing/Undressing Upper body dressing/undressing steps patient completed: Thread/unthread right sleeve of pullover shirt/dresss;Thread/unthread left sleeve of pullover shirt/dress;Put head through opening of pull over shirt/dress;Pull shirt over trunk Upper body dressing/undressing: 5: Supervision: Safety issues/verbal cues FIM - Lower Body Dressing/Undressing Lower body dressing/undressing steps patient completed: Thread/unthread right pants leg;Thread/unthread left pants leg;Pull pants up/down;Don/Doff right sock;Don/Doff left sock Lower body dressing/undressing: 4: Steadying Assist  FIM - Toileting Toileting steps completed by patient: Adjust clothing prior to toileting;Performs perineal hygiene;Adjust clothing after toileting Toileting Assistive Devices: Grab bar or rail for support Toileting: 4: Steadying assist  FIM - Diplomatic Services operational officer Devices: Grab bars;Elevated toilet seat Toilet Transfers: 4-To  toilet/BSC: Min A (steadying Pt. > 75%);4-From toilet/BSC: Min A (steadying Pt. > 75%)  FIM - Bed/Chair Transfer Bed/Chair Transfer: 4: Chair or W/C >  Bed: Min A (steadying Pt. > 75%);4: Bed > Chair or W/C: Min A (steadying Pt. > 75%)  FIM - Locomotion: Wheelchair Locomotion: Wheelchair: 0: Activity did not occur FIM - Locomotion: Ambulation Locomotion: Ambulation Assistive Devices: Designer, industrial/product Ambulation/Gait Assistance: 4: Min assist Locomotion: Ambulation: 4: Travels 150 ft or more with minimal assistance (Pt.>75%)  Comprehension Comprehension Mode: Auditory Comprehension: 5-Understands complex 90% of the time/Cues < 10% of the time  Expression Expression Mode: Verbal Expression: 4-Expresses basic 75 - 89% of the time/requires cueing 10 - 24% of the time. Needs helper to occlude trach/needs to repeat words.  Social Interaction Social Interaction: 5-Interacts appropriately 90% of the time - Needs monitoring or encouragement for participation or interaction.  Problem Solving Problem Solving: 4-Solves basic 75 - 89% of the time/requires cueing 10 - 24% of the time  Memory Memory: 3-Recognizes or recalls 50 - 74% of the time/requires cueing 25 - 49% of the time Medical Problem List and Plan:  1. Left frontoparietal subdural hematoma status post subdural drain  2. DVT Prophylaxis/Anticoagulation: SCDs. Monitor for any signs of DVT  3. Mood/mild dementia. Continue Aricept. Not at baseline per family 4. Neuropsych: This patient is not capable of making decisions on his/her own behalf.  5. Atrial fibrillation. Coumadin reversed and discontinued secondary to subdural hematoma. Cardiac rate control.  6. Hypertension. Cardura 2 mg each bedtime ,hydrochlorothiazide 12.5 mg daily, Lopressor 25 mg twice a day. Generally good control 7. BPH. Proscar 5 mg each bedtime. Check PVRs x3.  8. Hypothyroidism. Synthroid  9. Hyperlipidemia. Zocor 10.  Gout- medrol dose pack. improving  LOS  (Days) 6 A FACE TO FACE EVALUATION WAS PERFORMED  SWARTZ,ZACHARY T 07/03/2012 6:59 AM

## 2012-07-03 NOTE — Progress Notes (Signed)
Occupational Therapy Session Note  Patient Details  Name: [name] MRN: [id number] Date of Birth: 09-10-1919  Today's Date: 07/03/2012 Time: 1400-1445 Time Calculation (min): 45 min  Short Term Goals: Week 1:  OT Short Term Goal 1 (Week 1): Pt will alert staff of toileting needs 50% of the time with min cuing  OT Short Term Goal 2 (Week 1): Pt would demonstrate sustained attention in ADL tasks for 15 min  OT Short Term Goal 3 (Week 1): Pt will orient self with min questioning cuing wtih external aids OT Short Term Goal 4 (Week 1): Pt will perform toileting with min A   Skilled Therapeutic Interventions/Progress Updates:    1:1 family education with pt and his wife regarding BI recovery levels ([place] Levels), importance of establishing and sticking to a routine, pt requiring 24 hr supervision with ALL tasks, reintegration into community, OP OT focus, therapeutic activities pt can work on to promote cognition skills (sequencing, organization, attention, memory). Gave handout of schedule template to use. 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 Pt required max A to navigate computer and mouse to show his web site. Despite all education provided wife, she still needs more education; she didn't not see his trouble in using the computer. Plan to do bathing and dressing session with her tomorrow.   Therapy Documentation Precautions:  Precautions Precautions: Fall Restrictions Weight Bearing Restrictions: No Pain: Pain Assessment Pain Assessment: No/denies pain Pain Score: 0-No pain  See FIM for current functional status  Therapy/Group: Individual Therapy  [name] [facility] 07/03/2012, 3:37 PM

## 2012-07-03 NOTE — Consult Note (Signed)
NEUROCOGNITIVE TESTING - [facility]   [name] is a 79 year old, right-handed, Caucasian man, who was seen for a brief neuropsychological assessment to evaluate cognitive and emotional functioning post head injury.  According to his medical record, he was admitted on 06/23/2012 with altered mental status and right-sided weakness.  He reportedly fell a couple of weeks prior.  X-rays and imaging revealed a large subdural hematoma, which was drained on 06/23/2012.  Follow-up cranial CT demonstrated decrease in size of left frontal-parietal subdural hematoma.  Notably, he was maintained on a ventilator for a short time during his hospital stay.    PROCEDURES: [3 units of 16109 on 07/02/2012]  The following tests were performed during today's visit: Mini Mental Status Examination (brief version), Repeatable Battery for the Assessment of Neuropsychological Status (RBANS, form A), Geriatric Anxiety Inventory, and the Geriatric Depression Scale (short form).  Test results are as follows:   MMSE-2 (brief) Raw Score = 9/16 Description = Impaired   RBANS Indices Scaled Score Percentile Description  Immediate Memory  49 < 1 Profoundly Impaired  Visuospatial/Constructional 121 92 Superior  Language 86 18 Below Average  Attention 72 3 Impaired  Delayed Memory 40 < 1 Profoundly Impaired  Total Score 67 1 Profoundly Impaired   RBANS Subtests Raw Score Percentile Description  List Learning 9 < 1 Profoundly Impaired  Story Memory 2 < 1 Profoundly Impaired  Figure Copy 19 79 Above Average  Line Orientation 19 90 Above Average  Picture Naming 10 82 Above Average  Semantic Fluency 10 2 Profoundly Impaired  Digit Span 8 30 Average  Coding 14 < 1 Profoundly Impaired  List Recall 0 < 1 Profoundly Impaired  List Recognition 12 < 1 Profoundly Impaired  Story Recall 1 1 Profoundly Impaired  Figure recall 0 < 1 Profoundly Impaired   Geriatric Depression Scale  (short form) Raw Score = 1/15 Description = WNL   Geriatric Anxiety Inventory Raw Score = 2/20 Description = WNL   Test results revealed significant impairments in multiple cognitive domains; most notably in memory and in overall mental status, including orientation.  Additional weakness was seen in processing speed.  He demonstrated strength in visuospatial abilities and also remained intact in areas of confrontation naming and simple attention.  [name] responses to self-report measures of mood symptoms were not suggestive of significant depression or anxiety at this time.  While processing speed and encoding deficits could certainly result from hematoma in the frontal parietal region, the extensive memory deficits seen would be less typical for that type of injury.  Additionally, [name] wife noted changes in short-term memory prior to the fall.  As such, it is possible that a premorbid dementia was present, but [name] was able to compensate until his fall and now deficits are even more apparent, as he may have less ability to compensate.  In older age, sometimes cognitive recovery from head injury does not always occur and in fact, many older adults' cognitive difficulties worsen following head injury, particularly if premorbid issues existed.  As such, the exact course of [name] recovery is difficult to predict.  He may benefit from treatment with cholinesterase inhibitors and other memory aids to help with memory preservation.  Additionally, close monitoring by his family and possibly through re-evaluation will be useful in assessing for interval change that may indicate the need for further medical intervention.    In light of these findings, the following recommendations are provided.    RECOMMENDATIONS:  Recommendations for treatment team:     [name] cognitive functioning was profoundly impaired in multiple aspects of memory.  He is already prescribed  Aricept, but owing to the magnitude of memory impairment, additional medication (e.g. Namenda) may be a consideration.     When interacting with [name], directions and information should be provided in a simple, straight forward manner, and the treatment team should avoid giving multiple instructions simultaneously.    [name] may also benefit from being provided with multiple trials to learn new skills given the noted memory inefficiencies.    To the extent possible, multitasking should be avoided.   [name] requires more time than typical to process information. The treatment team may benefit from waiting for a verbal response to information before presenting additional information.    Performance will generally be best in a structured, routine, and familiar environment, as opposed to situations involving complex problems.   Recommendations for discharge planning:     Maintain engagement in mentally, physically and cognitively stimulating activities.    Strive to maintain a healthy lifestyle (e.g., proper diet and exercise) in order to promote physical, cognitive and emotional health.    Given evidence of rapid forgetfulness, [name] is at increased risk for safety issues (e.g. response in case of fire or injury).  As such, his family should avoid leaving him unattended for extended periods of time.  They should also ensure that someone is monitoring medications to ensure proper compliance and may consider purchasing life alert or similar system for times when he is home alone.     Establishing a power of attorney is warranted.    [name] should refrain from driving at this time.     Leavy Cella, Psy.D.  Clinical Neuropsychologist

## 2012-07-03 NOTE — Progress Notes (Signed)
Physical Therapy Note  Patient Details  Name: [name] MRN: [id number] Date of Birth: 1919-08-29 Today's Date: 07/03/2012  Time: 830-927 57 minutes  No c/o pain.  Balance training with step ups/tap ups with min A for step ups without UE support, close supervision for tap ups.  Coordination toe tapping with 3-4 number sequences with close supervision for balance, supervision cuing to recall sequencing.  Standing on foam horseshoe toss with close supervision, pt able to bend to retrieve horseshoes from floor without assist.  Dynamic gait training with ball toss, bounce and carry with min A for occasional LOB when reaching far out of BOS, close supervision when inside BOS.  Dynamic gait training with head turns and cognitive challenges in controlled environment with close supervision.  Pt's wife demo'd safely supervision assist with gait training, states she feels comfortable to assist pt in mobility around room.  RN aware.  Stair training x 10 stairs with B handrails with supervision.  Pt improving activity tolerance and balance reactions.  Individual therapy   [name] 07/03/2012, 9:25 AM

## 2012-07-03 NOTE — Progress Notes (Signed)
Occupational Therapy Session Note  Patient Details  Name: [name] MRN: [id number] Date of Birth: 03/08/1920  Today's Date: 07/03/2012 Time: 730-830 Time Calculation (min): 60 min  Short Term Goals: Week 1:  OT Short Term Goal 1 (Week 1): Pt will alert staff of toileting needs 50% of the time with min cuing  OT Short Term Goal 2 (Week 1): Pt would demonstrate sustained attention in ADL tasks for 15 min  OT Short Term Goal 3 (Week 1): Pt will orient self with min questioning cuing wtih external aids OT Short Term Goal 4 (Week 1): Pt will perform toileting with min A   Skilled Therapeutic Interventions/Progress Updates:  1:1 IP OT session with focus on self care retraining,orientation, organization, sequencing,sit to stands, bathing at shower level, toileting, dressing, grooming, functional mobility, and attention to task. Pt. Alert and oriented to self, place, and time with min questioning cues. After eating breakfast, Pt. Ambulated to shower with CGA and instructional cues to correct posture. Pt. demonstrated some diffuiculty today organizing his thoughts for bathing and required min to mod cues to complete task. Discussed with Nursing staff about removing the catheter at night and attempting sleeping in brief. Therapy Documentation Precautions:  Precautions Precautions: Fall Restrictions Weight Bearing Restrictions: No Pain: No complaint of pain See FIM for current functional status  Therapy/Group: Individual Therapy  [name] OTA/S Occupational Therapy Assistant Student 07/03/2012 3:39 PM

## 2012-07-03 NOTE — Progress Notes (Signed)
Speech Language Pathology Daily Session Note  Patient Details  Name: [name] MRN: [id number] Date of Birth: 07/28/1919  Today's Date: 07/03/2012 Time: 1030-1130 Time Calculation (min): 60 min  Short Term Goals: Week 1: SLP Short Term Goal 1 (Week 1): Pt will self-monitor and correct naming errors with supervision question and semantic cues.  SLP Short Term Goal 2 (Week 1): Pt will demonstrate orientation to time and siutation with Min A visual and semantic cues. SLP Short Term Goal 3 (Week 1): Pt will demonstrate functional problem solving for basic and familiar tasks with Mod A verbal and question cues.  SLP Short Term Goal 4 (Week 1): Pt will demonstrate sustained attention to a functional task for 15 mins with Min A verbal cues for redirection.  SLP Short Term Goal 5 (Week 1): Pt will maintain a topic during a functional conversation for ~4 turns with Min A verbal and question cues for redirection.  SLP Short Term Goal 6 (Week 1): Pt will identify 1 physical and 1 cognitive deficit with Mod A question and semantic cues.   Skilled Therapeutic Interventions: Treatment focus on speech and cognitive goals. SLP facilitated session by providing strategies to utilize to increase thought organization for generative naming task. Pt required Max A semantic and question cues to utilize strategies throughout the naming task. Pt was able to name ~7 items within a specific category when given a 1 minute time limit.     FIM:  Comprehension Comprehension Mode: Auditory Comprehension: 4-Understands basic 75 - 89% of the time/requires cueing 10 - 24% of the time Expression Expression: 4-Expresses basic 75 - 89% of the time/requires cueing 10 - 24% of the time. Needs helper to occlude trach/needs to repeat words. Social Interaction Social Interaction: 5-Interacts appropriately 90% of the time - Needs monitoring or encouragement for participation or interaction. Problem Solving Problem Solving:  3-Solves basic 50 - 74% of the time/requires cueing 25 - 49% of the time Memory Memory: 3-Recognizes or recalls 50 - 74% of the time/requires cueing 25 - 49% of the time  Pain Pain Assessment Pain Assessment: 0-10 Pain Score:   8 Pain Location: Foot Pain Orientation: Right Pain Descriptors: Aching;Discomfort;Sharp;Sore Pain Onset: Gradual Patients Stated Pain Goal: 2 Pain Intervention(s): Medication (See eMAR) (tylenol 650 mg po)  Therapy/Group: Individual Therapy  [name] 07/03/2012, 12:13 PM

## 2012-07-03 NOTE — Progress Notes (Signed)
This note has been reviewed and this clinician agrees with information provided.  

## 2012-07-03 NOTE — Progress Notes (Signed)
Physical Therapy Session Note  Patient Details  Name: ABUNDIO TEUSCHER MRN: [id number] Date of Birth: 07-04-1919  Today's Date: 07/03/2012 Time: 8295-6213 Time Calculation (min): 45 min  Short Term Goals: Week 1:  PT Short Term Goal 1 (Week 1): pt will demonstrate emergent awareness in functional sittuation with mod A PT Short Term Goal 2 (Week 1): Pt will gait in controlled environment 150' with min A PT Short Term Goal 3 (Week 1): Pt will demo dynamic standing balance with min A for functional task  Skilled Therapeutic Interventions/Progress Updates:  Patient ambulating in controlled environment with close supervision without AD 150' x2.  Standing balance activities at activity table on firm and compliant surface with close supervision for 2 bouts of 7 minutes each without LOB with seated rest between bouts.  Balance activities in standing: ball toss to rebounder; ball toss with NBOS.  Ambulation while carrying ball and with juggling ball on tile floor with close supervision without LOB.  Patient required occasional cuing to slow down during ambulation.    Therapy Documentation Precautions:  Precautions Precautions: Fall Restrictions Weight Bearing Restrictions: No Pain: Pain Assessment Pain Assessment: No/denies pain Pain Score: 0-No pain   See FIM for current functional status  Therapy/Group: Individual Therapy  Rexene Agent 07/03/2012, 2:41 PM

## 2012-07-04 ENCOUNTER — Inpatient Hospital Stay (HOSPITAL_COMMUNITY): Payer: Medicare Other | Admitting: Physical Therapy

## 2012-07-04 ENCOUNTER — Inpatient Hospital Stay (HOSPITAL_COMMUNITY): Payer: Medicare Other | Admitting: Speech Pathology

## 2012-07-04 ENCOUNTER — Encounter (HOSPITAL_COMMUNITY): Payer: Medicare Other | Admitting: Occupational Therapy

## 2012-07-04 DIAGNOSIS — F0390 Unspecified dementia without behavioral disturbance: Secondary | ICD-10-CM

## 2012-07-04 DIAGNOSIS — W19XXXA Unspecified fall, initial encounter: Secondary | ICD-10-CM

## 2012-07-04 DIAGNOSIS — S065X9A Traumatic subdural hemorrhage with loss of consciousness of unspecified duration, initial encounter: Secondary | ICD-10-CM

## 2012-07-04 DIAGNOSIS — S065XAA Traumatic subdural hemorrhage with loss of consciousness status unknown, initial encounter: Secondary | ICD-10-CM

## 2012-07-04 DIAGNOSIS — F0393 Unspecified dementia, unspecified severity, with mood disturbance: Secondary | ICD-10-CM

## 2012-07-04 MED ORDER — PREDNISONE 20 MG PO TABS
20.0000 mg | ORAL_TABLET | Freq: Every day | ORAL | Status: DC
  Administered 2012-07-05 – 2012-07-06 (×2): 20 mg via ORAL
  Filled 2012-07-04 (×4): qty 1

## 2012-07-04 NOTE — Progress Notes (Signed)
Physical Therapy Note  Patient Details  Name: DELVECCHIO MADOLE MRN: [id number] Date of Birth: 1919/08/31 Today's Date: 07/04/2012  Time: 1300-1400 60 minutes  Pt with continued c/o L foot pain, swelling.  Donned compression socks and rests provided as necessary.  Gait training without AD with supervision, pt with noticeable limp due to foot pain.  Gait training with cane with supervision, pt not sequencing cane correctly but does report it helps with his foot pain and he is able to gait with supervision with cane without LOB.  Stair training with pt/wife, wife understands she is to provide supervision while pt negotiates stairs with B handrails for home entry.  Obstacle negotiation and backward/sideways gait with cane on tile and carpeted floor without LOB with supervision.  [place] exercise program initiated for balance and LE strengthening, pt able to perform with visual cues for technique.  Pt with improving activity tolerance and balance reactions.  Individual therapy   Jorgeluis Gurganus 07/04/2012, 1:58 PM

## 2012-07-04 NOTE — Progress Notes (Signed)
Patient ID: [name], male   DOB: 1920/05/13, 79 y.o.   MRN: [id number] Subjective/Complaints: Slept well. Knew he was in the hospital. Says feet don't hurt. Therapy reports he couldn't bear weight on them yesterday afternoon.  A 12 point review of systems has been performed and if not noted above is otherwise negative.   Objective: Vital Signs: Blood pressure 130/82, pulse 84, temperature 97.9 F (36.6 C), temperature source Oral, resp. rate 18, height 5\' 8"  (1.727 m), weight 81.149 kg (178 lb 14.4 oz), SpO2 98.00%. No results found. No results found for this basename: WBC:2,HGB:2,HCT:2,PLT:2 in the last 72 hours No results found for this basename: NA:2,K:2,CL:2,CO:2,GLUCOSE:2,BUN:2,CREATININE:2,CALCIUM:2 in the last 72 hours CBG (last 3)  No results found for this basename: GLUCAP:3 in the last 72 hours  Wt Readings from Last 3 Encounters:  06/27/12 81.149 kg (178 lb 14.4 oz)  06/27/12 79.9 kg (176 lb 2.4 oz)  09/08/09 87.091 kg (192 lb)    Physical Exam:  Constitutional: He appears well-developed and well-nourished.  NAD. HENT:  Head: Normocephalic.  Right Ear: External ear normal.  Left Ear: External ear normal.  Eyes: Conjunctivae normal and EOM are normal. Pupils are equal, round, and reactive to light.  Pupils round and reactive to light  Neck: Neck supple. No thyromegaly present.  Cardiovascular: Normal rate, regular rhythm and normal heart sounds. Exam reveals no friction rub.  No murmur heard. Cardiac rate is controlled  Pulmonary/Chest: Effort normal and breath sounds normal. He has no wheezes. He has no rales.  Abdominal: Bowel sounds are normal. He exhibits no distension.  Musculoskeletal: no swelling, warmth or erythema in feet Neurological: He is alert.  Patient was oriented to self.   Still with Delayed processing.   Speech sometimes dysarthric and difficult to understand. Patient would follow simple commands. Moves all 4's with at least 4/5 strength. .  Lower extremities seem limited somewhat by pain, especially the RLE, but inconsistent. No gross sensory finding.  Skin: Skin is warm and dry.  Abrasion near the right knee healing   Assessment/Plan: 1. Functional deficits secondary to left frontoparietal subdural hematoma s/p subdural drain//premorbid dementia  which require 3+ hours per day of interdisciplinary therapy in a comprehensive inpatient rehab setting. Physiatrist is providing close team supervision and 24 hour management of active medical problems listed below. Physiatrist and rehab team continue to assess barriers to discharge/monitor patient progress toward functional and medical goals.  Given age and premorbid dementia, his prognosis for cognitive improvement is fair at best   FIM: FIM - Bathing Bathing Steps Patient Completed: Chest;Right Arm;Left Arm;Abdomen;Front perineal area;Right upper leg;Buttocks;Left upper leg;Right lower leg (including foot);Left lower leg (including foot) Bathing: 4: Steadying assist  FIM - Upper Body Dressing/Undressing Upper body dressing/undressing steps patient completed: Thread/unthread right sleeve of pullover shirt/dresss;Thread/unthread left sleeve of pullover shirt/dress;Put head through opening of pull over shirt/dress;Pull shirt over trunk Upper body dressing/undressing: 5: Supervision: Safety issues/verbal cues FIM - Lower Body Dressing/Undressing Lower body dressing/undressing steps patient completed: Thread/unthread right pants leg;Thread/unthread left pants leg;Pull pants up/down;Don/Doff left shoe;Don/Doff right shoe Lower body dressing/undressing: 5: Supervision: Safety issues/verbal cues  FIM - Toileting Toileting steps completed by patient: Adjust clothing prior to toileting;Performs perineal hygiene;Adjust clothing after toileting Toileting Assistive Devices: Grab bar or rail for support Toileting: 4: Steadying assist  FIM - Diplomatic Services operational officer  Devices: Grab bars;Elevated toilet seat Toilet Transfers: 5-To toilet/BSC: Supervision (verbal cues/safety issues);5-From toilet/BSC: Supervision (verbal cues/safety issues)  FIM - Bed/Chair Transfer  Bed/Chair Transfer: 5: Bed > Chair or W/C: Supervision (verbal cues/safety issues);5: Chair or W/C > Bed: Supervision (verbal cues/safety issues)  FIM - Locomotion: Wheelchair Locomotion: Wheelchair: 0: Activity did not occur FIM - Locomotion: Ambulation Locomotion: Ambulation Assistive Devices: Designer, industrial/product Ambulation/Gait Assistance: 4: Min assist Locomotion: Ambulation: 5: Travels 150 ft or more with supervision/safety issues  Comprehension Comprehension Mode: Auditory Comprehension: 4-Understands basic 75 - 89% of the time/requires cueing 10 - 24% of the time  Expression Expression Mode: Verbal Expression: 4-Expresses basic 75 - 89% of the time/requires cueing 10 - 24% of the time. Needs helper to occlude trach/needs to repeat words.  Social Interaction Social Interaction: 5-Interacts appropriately 90% of the time - Needs monitoring or encouragement for participation or interaction.  Problem Solving Problem Solving: 3-Solves basic 50 - 74% of the time/requires cueing 25 - 49% of the time  Memory Memory: 3-Recognizes or recalls 50 - 74% of the time/requires cueing 25 - 49% of the time Medical Problem List and Plan:  1. Left frontoparietal subdural hematoma status post subdural drain  2. DVT Prophylaxis/Anticoagulation: SCDs. Monitor for any signs of DVT  3. Mood/mild dementia. Continue Aricept. Significant STM deficits still 4. Neuropsych: This patient is not capable of making decisions on his/her own behalf.  5. Atrial fibrillation. Coumadin reversed and discontinued secondary to subdural hematoma. Cardiac rate control.  6. Hypertension. Cardura 2 mg each bedtime ,hydrochlorothiazide 12.5 mg daily, Lopressor 25 mg twice a day. Generally good control 7. BPH. Proscar 5 mg each  bedtime.  .  8. Hypothyroidism. Synthroid  9. Hyperlipidemia. Zocor 10.  Gout- change prednisone back to 20mg  and slowly taper  LOS (Days) 7 A FACE TO FACE EVALUATION WAS PERFORMED  Kenlei Safi T 07/04/2012 8:08 AM

## 2012-07-04 NOTE — Patient Care Conference (Signed)
Inpatient RehabilitationTeam Conference and Plan of Care Update Date: 07/03/2012   Time: 2:55 PM    Patient Name: [name]      Medical Record Number: [id number]  Date of Birth: 09-02-1919 Sex: Male         Room/Bed: 4027/4027-01 Payor Info: Payor: MEDICARE  Plan: MEDICARE PART A AND B  Product Type: *No Product type*     Admitting Diagnosis: LT FP SDH  Admit Date/Time:  06/27/2012  3:58 PM Admission Comments: No comment available   Primary Diagnosis:  Subdural hematoma, acute Principal Problem: Subdural hematoma, acute  Patient Active Problem List   Diagnosis Date Noted  . Subdural hematoma, acute 06/28/2012  . SDH (subdural hematoma) 06/23/2012  . Dementia 06/23/2012  . PVD (peripheral vascular disease) 06/23/2012  . BPH (benign prostatic hyperplasia) 06/23/2012  . Thyroid activity decreased 06/23/2012  . HTN (hypertension) 06/23/2012  . Atrial fibrillation, persistent 06/23/2012  . EPISTAXIS 09/08/2009  . INGROWN NAIL 08/30/2008    Expected Discharge Date: Expected Discharge Date: 07/06/12  Team Members Present: Physician leading conference: Dr. Faith Rogue Social Worker Present: Amada Jupiter, LCSW Nurse Present: Carmie End, RN PT Present: Reggy Eye, PT OT Present: Mackie Pai, Marye Round, OT;Ardis Rowan, COTA SLP Present: Feliberto Gottron, SLP     Current Status/Progress Goal Weekly Team Focus  Medical   TBI with pre-existing dementia. ongoing cognitive deficits, attention perhaps better  improved attention, urinary continence, increase balance,   maximize sleep, memory, pain control (gout)   Bowel/Bladder             Swallow/Nutrition/ Hydration             ADL's   supervision to min A  supervision  attention, sequencing, organization, (overall cognition), orientation   Mobility   supervision/min A  supervision  family ed   Communication   Min A  Supervision  thought organization, word-finding   Safety/Cognition/ Behavioral  Observations  Min-Mod A  Min A  problem solving, memory strategies   Pain   right foot pain and swelling tylenoll 650 mg po prn   less than 3   monitor effectiveness of prednisone and tylenol    Skin   skin dry no breakdown left scalp with a suture and staple intact   no new breakdown        Rehab Goals Patient on target to meet rehab goals: Yes *See Interdisciplinary Assessment and Plan and progress notes for long and short-term goals  Barriers to Discharge: memory and other cognitive issues    Possible Resolutions to Barriers:  cognitive training, memory strategies, family ed    Discharge Planning/Teaching Needs:  Home with wife to provide 24/7 supervision -       Team Discussion:  Memory issues do not appear to be improving, however, some improved awareness.  Wife does not always recognize when pt is struggling cognitively.  Need to stress with pt and family the need for 24/7 close supervision at d/c.  Has made good physical gains and wife is cleared to move around room with patient.  To begin family education tomorrow.  Revisions to Treatment Plan:  No changes to overall plan.   Continued Need for Acute Rehabilitation Level of Care: The patient requires daily medical management by a physician with specialized training in physical medicine and rehabilitation for the following conditions: Daily direction of a multidisciplinary physical rehabilitation program to ensure safe treatment while eliciting the highest outcome that is of practical value to the patient.: Yes  Daily medical management of patient stability for increased activity during participation in an intensive rehabilitation regime.: Yes Daily analysis of laboratory values and/or radiology reports with any subsequent need for medication adjustment of medical intervention for : Neurological problems (premorbid dementia, gout)  Katey Barrie 07/04/2012, 3:51 PM

## 2012-07-04 NOTE — Progress Notes (Signed)
Speech Language Pathology Daily Session Note  Patient Details  Name: [name] MRN: [id number] Date of Birth: 01-18-1920  Today's Date: 07/04/2012 Time: 5621-3086 Time Calculation (min): 45 min  Short Term Goals: Week 1: SLP Short Term Goal 1 (Week 1): Pt will self-monitor and correct naming errors with supervision question and semantic cues.  SLP Short Term Goal 2 (Week 1): Pt will demonstrate orientation to time and siutation with Min A visual and semantic cues. SLP Short Term Goal 3 (Week 1): Pt will demonstrate functional problem solving for basic and familiar tasks with Mod A verbal and question cues.  SLP Short Term Goal 4 (Week 1): Pt will demonstrate sustained attention to a functional task for 15 mins with Min A verbal cues for redirection.  SLP Short Term Goal 5 (Week 1): Pt will maintain a topic during a functional conversation for ~4 turns with Min A verbal and question cues for redirection.  SLP Short Term Goal 6 (Week 1): Pt will identify 1 physical and 1 cognitive deficit with Mod A question and semantic cues.   Skilled Therapeutic Interventions: Treatment focus on pt/family education in regards to current cognitive function and strategies to utilize at home to increase recall, safety awareness, problem solving and word-finding. The pt and all of his family members (2 sons and wife) verbalized understanding. Handouts were also given to increase understanding and recall of strategies.    FIM:  Comprehension Comprehension Mode: Auditory Comprehension: 4-Understands basic 75 - 89% of the time/requires cueing 10 - 24% of the time Expression Expression Mode: Verbal Expression: 4-Expresses basic 75 - 89% of the time/requires cueing 10 - 24% of the time. Needs helper to occlude trach/needs to repeat words. Social Interaction Social Interaction: 5-Interacts appropriately 90% of the time - Needs monitoring or encouragement for participation or interaction. Problem  Solving Problem Solving: 3-Solves basic 50 - 74% of the time/requires cueing 25 - 49% of the time Memory Memory: 3-Recognizes or recalls 50 - 74% of the time/requires cueing 25 - 49% of the time  Pain Pain Assessment Pain Assessment: No/denies pain  Therapy/Group: Individual Therapy  Yuval Rubens 07/04/2012, 5:28 PM

## 2012-07-04 NOTE — Progress Notes (Signed)
Physical Therapy Note  Patient Details  Name: ELBER GALYEAN MRN: [id number] Date of Birth: 09/17/1919 Today's Date: 07/04/2012  Time: 1100-1115 15 minutes  Pt c/o foot pain, RN aware.  Pt reports he feels very fatigued and did not sleep well last night.  Pt asks to rest and then says he will participate in afternoon session.  Pt/wife educated on safety and energy conservation ideas for home with both expressing understanding.  Also discussed possibility of a lunch outing tomorrow for education on community mobility and safety.  Pt/wife open to participating in lunch outing.  Pt missed 15 minutes of scheduled PT due to fatigue and foot pain.  Individual therapy   DONAWERTH,KAREN 07/04/2012, 11:26 AM

## 2012-07-04 NOTE — Progress Notes (Signed)
Social Work Patient ID: [name], male   DOB: 1920/04/08, 79 y.o.   MRN: [id number]  Met yesterday with patient and wife to review team conference.  Wife aware and agreeable with targeted d/c 1/17 with recommendation for close supervision.  Family education with wife began today.  Plan to arrange OP therapies in [name] location).  Continue to follow.  [name], LCSW

## 2012-07-04 NOTE — Progress Notes (Signed)
Occupational Therapy Session Note  Patient Details  Name: [name] MRN: [id number] Date of Birth: [birth year]  Today's Date: 07/04/2012 Time: 830-930 55 minutes  Short Term Goals: Week 1:  OT Short Term Goal 1 (Week 1): Pt will alert staff of toileting needs 50% of the time with min cuing  OT Short Term Goal 2 (Week 1): Pt would demonstrate sustained attention in ADL tasks for 15 min  OT Short Term Goal 3 (Week 1): Pt will orient self with min questioning cuing wtih external aids OT Short Term Goal 4 (Week 1): Pt will perform toileting with min A   Skilled Therapeutic Interventions/Progress Updates:  Pt. Participated in 1:1 OT session in room to work on attending to self care task, problem solving, safety during toilet transfer,toileting, functional mobility, sequencing, dressing and bathing minimizing cues, shower transfers,  and sit to stands. Pt.  Ambulated around room with close supervision. Pt. demonstrated dynamic standing balance with minimal cuing for problem solving skills and sustained attention to functional task when making the bed. Pt. Completed bathing and dressing  at shower level with supervision and min question cues.Grooming and oral hygiene completed standing at sink level. Pt. and spouse educated on current assistance level and how to provide cuing so that pt. completes task safely. Pt. wore regular underwear today with  Wife monitoring toileting needs.   Therapy Documentation Precautions:  Precautions Precautions: Fall Restrictions Weight Bearing Restrictions: No Pain: Pain Assessment Pain Assessment: No/denies pain Pain Score: 0-No pain  See FIM for current functional status  Therapy/Group: Individual Therapy  [name] 07/04/2012, 9:37 AM  This note has been reviewed and this clinician agrees with information provided.

## 2012-07-04 NOTE — Progress Notes (Signed)
Physical Therapy Note  Patient Details  Name: [name] MRN: [id number] Date of Birth: 1920/06/05 Today's Date: 07/04/2012  7829-5621 (30 minutes) individual Pain: no reported pain Focus of treatment: therapeutic activities focused on balance reactions/safety Treatment: Gait without AD intermittent min assist with occasional stagger to left or right ; Biodex limits of stability, weight  Shift programs with decreased protective balance reactions (ankle /hip strategies) requiring mod tactile cues .   1515-1600 (45 minutes) individual Pain: c/o rt foot pain/ premedicated Focus of treatment: gait training Treatment: Gait to/from room to gym [facility] SBA with instructional cues for sequencing AD; Static standing on soft surface to challenge hip/ankle strategies with minimal challenges (reaching) close SBA; alternate stepping to 4 inch step to challenge single leg stance SBA; stepping forward/backward over obstacle X 10 with min assist X 1 for balance.   Timiko Offutt,JIM 07/04/2012, 7:26 AM

## 2012-07-05 ENCOUNTER — Encounter (HOSPITAL_COMMUNITY): Payer: Medicare Other | Admitting: Occupational Therapy

## 2012-07-05 ENCOUNTER — Inpatient Hospital Stay (HOSPITAL_COMMUNITY): Payer: Medicare Other | Admitting: *Deleted

## 2012-07-05 ENCOUNTER — Inpatient Hospital Stay (HOSPITAL_COMMUNITY): Payer: Medicare Other | Admitting: Speech Pathology

## 2012-07-05 ENCOUNTER — Inpatient Hospital Stay (HOSPITAL_COMMUNITY): Payer: Medicare Other | Admitting: Physical Therapy

## 2012-07-05 NOTE — Progress Notes (Signed)
Occupational Therapy Progress and Discharge Summary  Patient Details  Name: [name] MRN: [id number] Date of Birth: [birth year]  Today's Date: 07/05/2012 Time: 2841-3244 Time Calculation (min): 45 min  Patient has met 10 of 10 long term goals due to improved activity tolerance, improved balance, improved attention, improved awareness and improved coordination.  Patient to discharge at overall Supervision level.  Patient's care partner is independent to provide the necessary physical and cognitive assistance at discharge. Pt. Demonstrates ability to complete basic self care task with Supervision. Family educated on patients functional independence level, assistance level required, and how to use cuing to assist pt. in completing daily task.   GRAD DAY Pt. Participated in 1:1 OT session in room to assess safety awarness during toileting, toilet transfer, shower transfer, functional mobility, bathing, dressing, grooming, standing balance, sit to stands, orientation, sequencing, and attention to task. Pt. Ambulated around room. Pt. completed bathing at sitting at shower level with supervision and Min question cues. Completed dressing and grooming safely with improved attention to task at supervision level. Pt wearing regular underwear today and expressed concern about not wanting to eat breakfast today because it may increase his urgency to use restroom. Family to monitor and assist with toileting needs.   Recommendation:  Patient will benefit from ongoing skilled OT services in outpatient setting to continue to advance functional skills in the area of BADL.  Equipment: No equipment provided  Reasons for discharge: treatment goals met and discharge from hospital  Patient/family agrees with progress made and goals achieved: Yes  OT Discharge Precautions/Restrictions  Precautions Precautions: Fall Pain Pain Assessment Pain Assessment: No/denies pain ADL ADL Grooming:  Supervision/safety Where Assessed-Grooming: Standing at sink Upper Body Bathing: Supervision/safety Where Assessed-Upper Body Bathing: Shower Lower Body Bathing: Supervision/safety Where Assessed-Lower Body Bathing: Shower Upper Body Dressing: Supervision/safety Where Assessed-Upper Body Dressing: Chair Lower Body Dressing: Supervision/safety Where Assessed-Lower Body Dressing: Chair Toilet Transfer: Close supervision Toilet Transfer Method: Proofreader: Grab bars Tub/Shower Transfer: Close supervison Web designer Method: Ship broker: Designer, multimedia: Close supervision Film/video editor Method: Designer, industrial/product: Transfer tub bench;Grab bars Vision/Perception  Vision - History Baseline Vision: Wears glasses only for reading  Cognition Orientation Level: Oriented to person;Oriented to place;Oriented to time Decreased short term memory, decreased recall of new information Focused attention appears intact Sensation Sensation Light Touch: Appears Intact Stereognosis: Appears Intact Hot/Cold: Appears Intact Proprioception: Appears Intact Motor  Within functional limits ;improved overall Mobility  Bed Mobility Bed Mobility: Rolling Right Rolling Right: 5: Supervision Transfers Sit to Stand: 5: Supervision Stand to Sit: 5: Supervision  Trunk/Postural Assessment  Cervical Assessment Cervical Assessment: Within Functional Limits Thoracic Assessment Thoracic Assessment: Within Functional Limits Lumbar Assessment Lumbar Assessment: Within Functional Limits Postural Control Postural Control: Within Functional Limits  Balance Static Sitting Balance Static Sitting - Balance Support: Feet supported Static Sitting - Level of Assistance: 6: Modified independent (Device/Increase time) Static Standing Balance Static Standing - Balance Support: No upper extremity  supported Static Standing - Level of Assistance: 5: Stand by assistance Dynamic Standing Balance Dynamic Standing - Balance Support: No upper extremity supported (Pt. performs functional task in room without AE) Dynamic Standing - Level of Assistance: 5: Stand by assistance Extremity/Trunk Assessment RUE Assessment RUE Assessment: Within Functional Limits    See FIM for current functional status  Lattimore, Tamika 07/05/2012, 3:09 PM  This note has been reviewed and this clinician agrees with information provided.

## 2012-07-05 NOTE — Progress Notes (Signed)
Physical Therapy Note  Patient Details  Name: [name] MRN: [id number] Date of Birth: [birth year] Today's Date: 07/05/2012  Time: [phone] 30 minutes  Pt continues with c/o R foot pain with gait, eases with rest.  Session focused on pt/family education with pt's wife.  Pt's wife safely demo'd supervision level assistance for gait and stair negotiation with 1 handrail.  Pt/wife educated on recommendation for steadying assist when out in community, wife expresses understanding.  Floor transfer with supervision.  Discussed safety at home and after a fall with pt/wife who expressed understanding.  Pt encouraged to use SPC at home for steadying and to reduce R foot pain, pt/wife in agreement.  Individual therapy   DONAWERTH,KAREN 07/05/2012, 10:11 AM

## 2012-07-05 NOTE — Discharge Summary (Signed)
  Discharge summary job [id number]

## 2012-07-05 NOTE — Progress Notes (Signed)
Physical Therapy Note  Patient Details  Name: [name] MRN: [id number] Date of Birth: 12-30-1919 Today's Date: 07/05/2012  Time: 1100-1300 120 minutes No c/o pain.  Pt participated in community reintegration/outing to [employer] ambulatory level with and without SPC with supervision indoors and steadying assist needed on uneven sidewalks in community. Session focused on community mobility both indoor and outdoor even/uneven surfaces, identification & negotiation of obstacles, identification of energy conservation techniques, problem solving how to carry items while ambulating, & accessing public restrooms.  Pt and wife and son educated on community mobility and above techniques and all express understanding.  Pt continues to require min cuing for attention to task in a max distracting environment as well as assistance with organization of thoughts and problem solving.  Pt's wife able to provide appropriate level of assist and supervision.  Individual therapy   Thong Feeny 07/05/2012, 4:25 PM

## 2012-07-05 NOTE — Discharge Summary (Signed)
NAMETOMA, ARTS            ACCOUNT NO.:  [id number]  MEDICAL RECORD NO.:  [id number]  LOCATION:  4027                         FACILITY:  [facility]  PHYSICIAN:  Ranelle Oyster, M.D.DATE OF BIRTH:  12-05-1919  DATE OF ADMISSION:  06/27/2012 DATE OF DISCHARGE:                              DISCHARGE SUMMARY   DISCHARGE DIAGNOSES:  Left frontoparietal subdural hematoma with subdural drain, sequential compression devices for deep vein thrombosis prophylaxis, mild dementia, atrial fibrillation, hypertension, benign prostatic hypertrophy, hypothyroidism, hyperlipidemia, gout.  HISTORY OF PRESENT ILLNESS:  This is a 79 year old right-handed male with history of atrial fibrillation on chronic Coumadin therapy, who was independent and active prior to admission.  Admitted June 23, 2012 with altered mental status and right-sided weakness.  There was a report of a fall couple of weeks ago.  X-rays and imaging revealed a large subdural hematoma.  INR on admission of 2.5 and reversed.  Underwent bedside twist drill placement of subdural drain June 23, 2012 per Dr. Wynetta Emery.  Followup cranial CT scan was significant decrease in size of left frontal parietal subdural hematoma.  The patient maintained on a regular consistency diet.  The patient maintained on ventilator support for a short time, followed by Critical Care Medicine.  He remained off Coumadin therapy secondary to subdural hematoma.  Physical and Occupational Therapy ongoing and the patient was admitted for a comprehensive rehab program.  PAST MEDICAL HISTORY:  See discharge diagnoses.  SOCIAL HISTORY:  Lives with spouse, 1 level home.  FUNCTIONAL HISTORY PRIOR TO ADMISSION:  Independent driving and retired.  FUNCTIONAL STATUS UPON ADMISSION TO REHAB SERVICES:  +2 total assist to ambulate 25 feet with a rolling walker with 2 person assistance.  PHYSICAL EXAMINATION:  VITAL SIGNS:  Blood pressure 128/78, pulse 93, temperature  98.2, respirations 18. GENERAL:  This was an alert male in no acute distress. HEENT:  Pupils are round and reactive to light. NEURO:  He was able to name person, place as well as date of birth. Delay in processing.  Occasionally perseverates and sometimes distracted.  Speech mildly dysarthric, but fully intelligible. LUNGS:  Clear to auscultation. CARDIAC:  Rate controlled. ABDOMEN:  Soft, nontender.  Good bowel sounds.  REHABILITATION HOSPITAL COURSE:  The patient was admitted to inpatient rehab services with therapies initiated on a 3-hour daily basis consisting of physical therapy, occupational therapy, speech therapy, and rehabilitation nursing.  The following issues were addressed during the patient's rehabilitation stay.  Pertaining to Mr. Troop left frontal parietal subdural hematoma, he had undergone subdural drain and would follow up with Dr. Wynetta Emery.  The patient continued to make progressive gains.  Sequential compression devices were in place for DVT prophylaxis.  He remained on Aricept for history of mild dementia with some short-term memory deficits.  The patient was independent prior to admission and driving.  Cardiac rate remained controlled.  His Coumadin had been reversed secondary to subdural hematoma.  He would remain off Coumadin therapy.  The patient remained on beta blocker as advised. Blood pressures remained well controlled.  Hypothyroidism with hormone supplement as directed.  The patient with complaints of some gouty pain in his foot.  He was placed on trial of prednisone therapy  and tapered with good results.  The patient received weekly collaborative interdisciplinary team conferences to discuss estimated length of stay, family teaching, and any barriers to discharge.  He was continent of bowel and bladder.  Supervision to minimal assist for activities of daily living, supervision to minimal assist for mobility.  He was able to communicate his needs.   His short-term memory appeared to be essentially close to his baseline as prior to admission.  Needed and stressed the patient and family the need for 24-hour supervision for patient safety.  Ongoing therapies were dictated as per rehab services.  DISCHARGE MEDICATIONS:  At the time of dictation included: 1. Aricept 5 mg daily. 2. Cardura 2 mg p.o. at bedtime. 3. Proscar 5 mg p.o. at bedtime. 4. Hydrochlorothiazide 12.5 mg p.o. daily. 5. Synthroid 50 mcg p.o. daily. 6. Lopressor 25 mg p.o. b.i.d. 7. Protonix 40 mg daily. 8. Prednisone 20 mg daily taper as directed. 9. Zocor 20 mg daily.  DIET:  Regular.  SPECIAL INSTRUCTIONS:  The patient would follow up with Dr. Daleen Bo at the outpatient rehab service office on July 25, 2012, Dr. Donalee Citrin, Neurosurgery 2 weeks call for appointment, Dr. Georgann Housekeeper, medical management, appointment to be made.  Ongoing therapies were arranged as per [employer].     Mariam Dollar, P.A.   ______________________________ Ranelle Oyster, M.D.    DA/MEDQ  D:  07/05/2012  T:  07/05/2012  Job:  [id number]  cc:   Donalee Citrin, M.D. Georgann Housekeeper, MD

## 2012-07-05 NOTE — Progress Notes (Signed)
Physical Therapy Discharge Summary  Patient Details  Name: [name] MRN: [id number] Date of Birth: 19-Dec-1919  Today's Date: 07/05/2012  Patient has met 7 of 7 long term goals due to improved activity tolerance, improved balance, ability to compensate for deficits, improved attention and improved awareness.  Patient to discharge at an ambulatory level Supervision.  Pt using SPC due to L foot pain, able to gait without AD with supervision safely.  Pt mainly limited by cognitive deficits including decreased attention and awareness.  Pt/wife have been educated on and expressed understanding of techniques to improve safety, energy conservation and mobility at home.  Patient's care partner is independent to provide the necessary cognitive assistance at discharge.  Reasons goals not met: n/a  Recommendation:  Patient will benefit from ongoing skilled PT services in outpatient setting to continue to advance safe functional mobility, address ongoing impairments in balance, activity tolerance, gait, and minimize fall risk.  Equipment: SPC  Reasons for discharge: treatment goals met and discharge from hospital  Patient/family agrees with progress made and goals achieved: Yes  PT Discharge  Cognition Overall Cognitive Status: Impaired Selective Attention: Impaired Awareness: Impaired Awareness Impairment: Emergent impairment Safety/Judgment: Impaired Rancho Mirant Scales of Cognitive Functioning: Automatic/appropriate Sensation Sensation Light Touch: Appears Intact Proprioception: Appears Intact Coordination Gross Motor Movements are Fluid and Coordinated: Yes Motor  Motor Motor: Within Functional Limits   Trunk/Postural Assessment  Cervical Assessment Cervical Assessment: Within Functional Limits Thoracic Assessment Thoracic Assessment: Within Functional Limits Lumbar Assessment Lumbar Assessment: Within Functional Limits Postural Control Postural Control: Within  Functional Limits  Balance Static Sitting Balance Static Sitting - Balance Support: Feet supported Static Sitting - Level of Assistance: 7: Independent Static Standing Balance Static Standing - Balance Support: No upper extremity supported Static Standing - Level of Assistance: 5: Stand by assistance Dynamic Standing Balance Dynamic Standing - Balance Support: No upper extremity supported (Pt. performs functional task in room without AE) Dynamic Standing - Level of Assistance: 5: Stand by assistance Extremity Assessment      RLE Assessment RLE Assessment: Within Functional Limits LLE Assessment LLE Assessment: Within Functional Limits  See FIM for current functional status  [name] 07/05/2012, 4:31 PM

## 2012-07-05 NOTE — Progress Notes (Signed)
Patient ID: [name], male   DOB: [birth year], 79 y.o.   MRN: [id number] Subjective/Complaints: Slept well. No complaints this am. In good spirits.   A 12 point review of systems has been performed and if not noted above is otherwise negative.   Objective: Vital Signs: Blood pressure 164/76, pulse 73, temperature 97.9 F (36.6 C), temperature source Oral, resp. rate 18, height 5\' 8"  (1.727 m), weight 78.6 kg (173 lb 4.5 oz), SpO2 96.00%. No results found. No results found for this basename: WBC:2,HGB:2,HCT:2,PLT:2 in the last 72 hours No results found for this basename: NA:2,K:2,CL:2,CO:2,GLUCOSE:2,BUN:2,CREATININE:2,CALCIUM:2 in the last 72 hours CBG (last 3)  No results found for this basename: GLUCAP:3 in the last 72 hours  Wt Readings from Last 3 Encounters:  07/04/12 78.6 kg (173 lb 4.5 oz)  06/27/12 79.9 kg (176 lb 2.4 oz)  09/08/09 87.091 kg (192 lb)    Physical Exam:  Constitutional: He appears well-developed and well-nourished.  NAD. HENT:  Head: Normocephalic.  Right Ear: External ear normal.  Left Ear: External ear normal.  Eyes: Conjunctivae normal and EOM are normal. Pupils are equal, round, and reactive to light.  Pupils round and reactive to light  Neck: Neck supple. No thyromegaly present.  Cardiovascular: Normal rate, regular rhythm and normal heart sounds. Exam reveals no friction rub.  No murmur heard. Cardiac rate is controlled  Pulmonary/Chest: Effort normal and breath sounds normal. He has no wheezes. He has no rales.  Abdominal: Bowel sounds are normal. He exhibits no distension.  Musculoskeletal: no swelling, warmth or erythema in feet Neurological: He is alert.  Patient was oriented to self.   Still with Delayed processing.   Speech sometimes dysarthric and difficult to understand. Patient would follow simple commands. Moves all 4's with at least 4/5 strength. . Lower extremities seem limited somewhat by pain, especially the RLE, but  inconsistent. No gross sensory finding.  Skin: Skin is warm and dry.  Abrasion near the right knee healing   Assessment/Plan: 1. Functional deficits secondary to left frontoparietal subdural hematoma s/p subdural drain//premorbid dementia  which require 3+ hours per day of interdisciplinary therapy in a comprehensive inpatient rehab setting. Physiatrist is providing close team supervision and 24 hour management of active medical problems listed below. Physiatrist and rehab team continue to assess barriers to discharge/monitor patient progress toward functional and medical goals.  Given age and premorbid dementia, his prognosis for cognitive improvement is fair at best   FIM: FIM - Bathing Bathing Steps Patient Completed: Chest;Right Arm;Left Arm;Abdomen;Front perineal area;Buttocks;Right upper leg;Left upper leg;Right lower leg (including foot);Left lower leg (including foot) Bathing: 5: Supervision: Safety issues/verbal cues  FIM - Upper Body Dressing/Undressing Upper body dressing/undressing steps patient completed: Thread/unthread right sleeve of front closure shirt/dress;Thread/unthread left sleeve of front closure shirt/dress;Pull shirt around back of front closure shirt/dress;Button/unbutton shirt Upper body dressing/undressing: 5: Set-up assist to: Obtain clothing/put away FIM - Lower Body Dressing/Undressing Lower body dressing/undressing steps patient completed: Thread/unthread right underwear leg;Thread/unthread left underwear leg;Pull underwear up/down;Thread/unthread right pants leg;Thread/unthread left pants leg;Pull pants up/down;Don/Doff right shoe;Don/Doff left shoe Lower body dressing/undressing: 5: Supervision: Safety issues/verbal cues  FIM - Toileting Toileting steps completed by patient: Adjust clothing prior to toileting;Performs perineal hygiene;Adjust clothing after toileting Toileting Assistive Devices: Grab bar or rail for support Toileting: 5: Supervision:  Safety issues/verbal cues  FIM - Diplomatic Services operational officer Devices: Grab bars Toilet Transfers: 5-To toilet/BSC: Supervision (verbal cues/safety issues);5-From toilet/BSC: Supervision (verbal cues/safety issues)  FIM - Bed/Chair Transfer  Bed/Chair Transfer: 5: Bed > Chair or W/C: Supervision (verbal cues/safety issues);5: Chair or W/C > Bed: Supervision (verbal cues/safety issues)  FIM - Locomotion: Wheelchair Locomotion: Wheelchair: 0: Activity did not occur FIM - Locomotion: Ambulation Locomotion: Ambulation Assistive Devices: Designer, industrial/product Ambulation/Gait Assistance: 4: Min assist Locomotion: Ambulation: 5: Travels 150 ft or more with supervision/safety issues  Comprehension Comprehension Mode: Auditory Comprehension: 4-Understands basic 75 - 89% of the time/requires cueing 10 - 24% of the time  Expression Expression Mode: Verbal Expression: 4-Expresses basic 75 - 89% of the time/requires cueing 10 - 24% of the time. Needs helper to occlude trach/needs to repeat words.  Social Interaction Social Interaction: 5-Interacts appropriately 90% of the time - Needs monitoring or encouragement for participation or interaction.  Problem Solving Problem Solving: 3-Solves basic 50 - 74% of the time/requires cueing 25 - 49% of the time  Memory Memory: 3-Recognizes or recalls 50 - 74% of the time/requires cueing 25 - 49% of the time Medical Problem List and Plan:  1. Left frontoparietal subdural hematoma status post subdural drain  2. DVT Prophylaxis/Anticoagulation: SCDs. Monitor for any signs of DVT  3. Mood/mild dementia. Continue Aricept. Significant STM deficits still 4. Neuropsych: This patient is not capable of making decisions on his/her own behalf.  5. Atrial fibrillation. Coumadin reversed and discontinued secondary to subdural hematoma. Cardiac rate control.  6. Hypertension. Cardura 2 mg each bedtime ,hydrochlorothiazide 12.5 mg daily, Lopressor 25 mg twice  a day. Generally good control 7. BPH. Proscar 5 mg each bedtime.  .  8. Hypothyroidism. Synthroid  9. Hyperlipidemia. Zocor 10.  Gout- continue prednisone at 20mg  qday. Taper down  LOS (Days) 8 A FACE TO FACE EVALUATION WAS PERFORMED  Sabrine Patchen T 07/05/2012 7:55 AM

## 2012-07-05 NOTE — Progress Notes (Signed)
Speech Language Pathology Daily Session Note  Patient Details  Name: [name] MRN: [id number] Date of Birth: 03-Aug-1919  Today's Date: 07/05/2012 Time: 4098-1191 Time Calculation (min): 55 min  Short Term Goals: Week 1: SLP Short Term Goal 1 (Week 1): Pt will self-monitor and correct naming errors with supervision question and semantic cues.  SLP Short Term Goal 2 (Week 1): Pt will demonstrate orientation to time and siutation with Min A visual and semantic cues. SLP Short Term Goal 3 (Week 1): Pt will demonstrate functional problem solving for basic and familiar tasks with Mod A verbal and question cues.  SLP Short Term Goal 4 (Week 1): Pt will demonstrate sustained attention to a functional task for 15 mins with Min A verbal cues for redirection.  SLP Short Term Goal 5 (Week 1): Pt will maintain a topic during a functional conversation for ~4 turns with Min A verbal and question cues for redirection.  SLP Short Term Goal 6 (Week 1): Pt will identify 1 physical and 1 cognitive deficit with Mod A question and semantic cues.   Skilled Therapeutic Interventions: Treatment focus on cognitive goals. Pt participated in functional conversation and demonstrated appropriate word-finding. Pt required Moderate verbal cues for functional problem solving with navigating the internet/computer. Pt recalled morning therapy events and that he was going on an outing today with Min question cues.    FIM:  Comprehension Comprehension Mode: Auditory Comprehension: 5-Understands basic 90% of the time/requires cueing < 10% of the time Expression Expression Mode: Verbal Expression: 4-Expresses basic 75 - 89% of the time/requires cueing 10 - 24% of the time. Needs helper to occlude trach/needs to repeat words. Social Interaction Social Interaction: 5-Interacts appropriately 90% of the time - Needs monitoring or encouragement for participation or interaction. Problem Solving Problem Solving: 3-Solves  basic 50 - 74% of the time/requires cueing 25 - 49% of the time Memory Memory: 3-Recognizes or recalls 50 - 74% of the time/requires cueing 25 - 49% of the time  Pain Pain Assessment Pain Assessment: No/denies pain  Therapy/Group: Individual Therapy  [name] 07/05/2012, 4:59 PM

## 2012-07-06 DIAGNOSIS — W19XXXA Unspecified fall, initial encounter: Secondary | ICD-10-CM

## 2012-07-06 DIAGNOSIS — F0390 Unspecified dementia without behavioral disturbance: Secondary | ICD-10-CM

## 2012-07-06 DIAGNOSIS — S065X9A Traumatic subdural hemorrhage with loss of consciousness of unspecified duration, initial encounter: Secondary | ICD-10-CM

## 2012-07-06 MED ORDER — LEVOTHYROXINE SODIUM 50 MCG PO TABS: 50.0000 ug | ORAL_TABLET | Freq: Every day | ORAL | Status: DC

## 2012-07-06 MED ORDER — PREDNISONE 10 MG PO TABS
10.0000 mg | ORAL_TABLET | Freq: Every day | ORAL | Status: DC
  Filled 2012-07-06: qty 1

## 2012-07-06 MED ORDER — METOPROLOL TARTRATE 50 MG PO TABS: 25.0000 mg | ORAL_TABLET | Freq: Two times a day (BID) | ORAL | Status: DC

## 2012-07-06 MED ORDER — FINASTERIDE 5 MG PO TABS: 5.0000 mg | ORAL_TABLET | Freq: Every day | ORAL | Status: DC

## 2012-07-06 MED ORDER — PREDNISONE 10 MG PO TABS: 10.0000 mg | ORAL_TABLET | Freq: Every day | ORAL | Status: DC

## 2012-07-06 MED ORDER — HYDROCHLOROTHIAZIDE 25 MG PO TABS: 12.5000 mg | ORAL_TABLET | Freq: Every day | ORAL | Status: DC

## 2012-07-06 MED ORDER — SIMVASTATIN 40 MG PO TABS: 20.0000 mg | ORAL_TABLET | Freq: Every evening | ORAL | Status: DC

## 2012-07-06 MED ORDER — DOXAZOSIN MESYLATE 2 MG PO TABS: 2.0000 mg | ORAL_TABLET | Freq: Every day | ORAL | Status: DC

## 2012-07-06 MED ORDER — DONEPEZIL HCL 5 MG PO TABS: 5.0000 mg | ORAL_TABLET | Freq: Every day | ORAL | Status: DC

## 2012-07-06 NOTE — Progress Notes (Signed)
Social Work  Discharge Note  The overall goal for the admission was met for:   Discharge location: Yes - home with wife to provide 24/7 supervision  Length of Stay: Yes - 11 days  Discharge activity level: Yes - supervision  Home/community participation: Yes  Services provided included: MD, RD, PT, OT, SLP, RN, TR, Pharmacy, Neuropsych and SW  Financial Services: Medicare and Private Insurance: [employer]  Follow-up services arranged: Outpatient: PT, OT, ST via [facility], DME: tub seat via [facility] and Patient/Family has no preference for HH/DME agencies  Comments (or additional information):  Patient/Family verbalized understanding of follow-up arrangements: Yes  Individual responsible for coordination of the follow-up plan: wife  Confirmed correct DME delivered: Amada Jupiter 07/06/2012    Shawnta Schlegel

## 2012-07-06 NOTE — Progress Notes (Signed)
Patient ID: [name], male   DOB: [birth year], 79 y.o.   MRN: [id number] Subjective/Complaints: No new issues. Pain seems better.  A 12 point review of systems has been performed and if not noted above is otherwise negative.   Objective: Vital Signs: Blood pressure 146/84, pulse 70, temperature 98.2 F (36.8 C), temperature source Oral, resp. rate 19, height 5\' 8"  (1.727 m), weight 78.6 kg (173 lb 4.5 oz), SpO2 97.00%. No results found. No results found for this basename: WBC:2,HGB:2,HCT:2,PLT:2 in the last 72 hours No results found for this basename: NA:2,K:2,CL:2,CO:2,GLUCOSE:2,BUN:2,CREATININE:2,CALCIUM:2 in the last 72 hours CBG (last 3)  No results found for this basename: GLUCAP:3 in the last 72 hours  Wt Readings from Last 3 Encounters:  07/04/12 78.6 kg (173 lb 4.5 oz)  06/27/12 79.9 kg (176 lb 2.4 oz)  09/08/09 87.091 kg (192 lb)    Physical Exam:  Constitutional: He appears well-developed and well-nourished.  NAD. HENT:  Head: Normocephalic.  Right Ear: External ear normal.  Left Ear: External ear normal.  Eyes: Conjunctivae normal and EOM are normal. Pupils are equal, round, and reactive to light.  Pupils round and reactive to light  Neck: Neck supple. No thyromegaly present.  Cardiovascular: Normal rate, regular rhythm and normal heart sounds. Exam reveals no friction rub.  No murmur heard. Cardiac rate is controlled  Pulmonary/Chest: Effort normal and breath sounds normal. He has no wheezes. He has no rales.  Abdominal: Bowel sounds are normal. He exhibits no distension.  Musculoskeletal: no swelling, warmth or erythema in feet Neurological: He is alert.  Patient was oriented to self.  Knows he's in hospital. Doesn't remember my name. Quicker processing but diminsiehd insight and awareness.  Moves all 4's with at least 4/5 strength. . Lower extremities seem limited somewhat by pain, especially the RLE, but inconsistent. No gross sensory finding.  Skin:  Skin is warm and dry.  Abrasion near the right knee healing   Assessment/Plan: 1. Functional deficits secondary to left frontoparietal subdural hematoma s/p subdural drain//premorbid dementia  which require 3+ hours per day of interdisciplinary therapy in a comprehensive inpatient rehab setting. Physiatrist is providing close team supervision and 24 hour management of active medical problems listed below. Physiatrist and rehab team continue to assess barriers to discharge/monitor patient progress toward functional and medical goals.  Dc home today. Full supervision required   FIM: FIM - Bathing Bathing Steps Patient Completed: Chest;Right Arm;Left Arm;Abdomen;Front perineal area;Buttocks;Right upper leg;Left upper leg;Right lower leg (including foot);Left lower leg (including foot) Bathing: 5: Supervision: Safety issues/verbal cues  FIM - Upper Body Dressing/Undressing Upper body dressing/undressing steps patient completed: Thread/unthread right sleeve of pullover shirt/dresss;Thread/unthread left sleeve of pullover shirt/dress;Put head through opening of pull over shirt/dress;Pull shirt over trunk;Thread/unthread right sleeve of front closure shirt/dress;Thread/unthread left sleeve of front closure shirt/dress;Pull shirt around back of front closure shirt/dress;Button/unbutton shirt Upper body dressing/undressing: 5: Supervision: Safety issues/verbal cues FIM - Lower Body Dressing/Undressing Lower body dressing/undressing steps patient completed: Thread/unthread right underwear leg;Thread/unthread left underwear leg;Pull underwear up/down;Thread/unthread right pants leg;Thread/unthread left pants leg;Pull pants up/down;Don/Doff right shoe;Don/Doff left shoe Lower body dressing/undressing: 5: Supervision: Safety issues/verbal cues  FIM - Toileting Toileting steps completed by patient: Adjust clothing prior to toileting;Performs perineal hygiene;Adjust clothing after toileting Toileting  Assistive Devices: Grab bar or rail for support Toileting: 5: Supervision: Safety issues/verbal cues  FIM - Diplomatic Services operational officer Devices: Grab bars Toilet Transfers: 5-To toilet/BSC: Supervision (verbal cues/safety issues);5-From toilet/BSC: Supervision (verbal cues/safety issues)  FIM -  Bed/Chair Transfer Bed/Chair Transfer: 7: Sit > Supine: No assist;7: Supine > Sit: No assist;5: Bed > Chair or W/C: Supervision (verbal cues/safety issues);5: Chair or W/C > Bed: Supervision (verbal cues/safety issues)  FIM - Locomotion: Wheelchair Locomotion: Wheelchair: 0: Activity did not occur FIM - Locomotion: Ambulation Locomotion: Ambulation Assistive Devices: Designer, industrial/product Ambulation/Gait Assistance: 4: Min assist Locomotion: Ambulation: 5: Travels 150 ft or more with supervision/safety issues  Comprehension Comprehension Mode: Auditory Comprehension: 5-Understands basic 90% of the time/requires cueing < 10% of the time  Expression Expression Mode: Verbal Expression: 4-Expresses basic 75 - 89% of the time/requires cueing 10 - 24% of the time. Needs helper to occlude trach/needs to repeat words.  Social Interaction Social Interaction: 5-Interacts appropriately 90% of the time - Needs monitoring or encouragement for participation or interaction.  Problem Solving Problem Solving: 3-Solves basic 50 - 74% of the time/requires cueing 25 - 49% of the time  Memory Memory: 3-Recognizes or recalls 50 - 74% of the time/requires cueing 25 - 49% of the time Medical Problem List and Plan:  1. Left frontoparietal subdural hematoma status post subdural drain  2. DVT Prophylaxis/Anticoagulation: SCDs. Monitor for any signs of DVT  3. Mood/mild dementia. Continue Aricept. Significant STM deficits still 4. Neuropsych: This patient is not capable of making decisions on his/her own behalf.  5. Atrial fibrillation. Coumadin reversed and discontinued secondary to subdural hematoma.  Cardiac rate control.  6. Hypertension. Cardura 2 mg each bedtime ,hydrochlorothiazide 12.5 mg daily, Lopressor 25 mg twice a day. Generally good control 7. BPH. Proscar 5 mg each bedtime.  .  8. Hypothyroidism. Synthroid  9. Hyperlipidemia. Zocor 10.  Gout- continue prednisone at 20mg  qday. Taper down to 10mg  starting tomorrow  for 4 days then stop  LOS (Days) 9 A FACE TO FACE EVALUATION WAS PERFORMED  SWARTZ,ZACHARY T 07/06/2012 6:31 AM

## 2012-07-06 NOTE — Progress Notes (Signed)
Speech Language Pathology Discharge Summary  Patient Details  Name: [name] MRN: [id number] Date of Birth: 25-Sep-1919  Today's Date: 07/06/2012  Patient has met 6 of 6 long term goals.  Patient to discharge at overall Min;Mod level.   Reasons goals not met: N/A   Clinical Impression/Discharge Summary: Pt has made functional gains and has met all LTG's this admission. Currently, pt is demonstrating behaviors consistent with a Rancho Level VI-VII and requires min-mod A for functional problem solving, working memory with functional tasks, anticipatory awareness, selective attention and organization. Pt's cognitive function is also impacted by his h/o dementia. Pt also demonstrated increased word-finding with utilization of strategies during functional conversation. Pt/family education complete and pt would benefit from f/u skilled SLP intervention in an outpatient setting to maximize cognitive function and overall independence.   Care Partner:  Caregiver Able to Provide Assistance: Yes  Type of Caregiver Assistance: Physical;Cognitive  Recommendation:  24 hour supervision/assistance;Outpatient SLP  Rationale for SLP Follow Up: Maximize cognitive function and independence;Reduce caregiver burden   Equipment: N/A   Reasons for discharge: Treatment goals met;Discharged from hospital   Patient/Family Agrees with Progress Made and Goals Achieved: Yes   See FIM for current functional status  Ryun Velez 07/06/2012, 7:58 AM

## 2012-07-06 NOTE — Progress Notes (Signed)
Patient discharged home with family.  Discharge instructions given by Deatra Ina, PA.  Family verbalized understanding.

## 2012-07-11 NOTE — Progress Notes (Signed)
I agree with the above assessment and plan

## 2012-07-11 NOTE — Progress Notes (Signed)
Spoke with patient and wife regarding questions about some swelling "dime size" near the incision. Wife reports patient doing well without any neurologic changes. Spoke with patient also about his concerns regarding using klonopin and incision. Recommended monitoring area as no signs of infection and neuro symptoms to look out for reviewed. To contact NS if swelling increases or for further questions. Will update Dr Riley Kill per patient request.

## 2012-07-25 ENCOUNTER — Encounter: Payer: Medicare Other | Attending: Physical Medicine & Rehabilitation | Admitting: Physical Medicine & Rehabilitation

## 2012-07-25 ENCOUNTER — Encounter: Payer: Self-pay | Admitting: Physical Medicine & Rehabilitation

## 2012-07-25 VITALS — BP 108/47 | HR 80 | Resp 14 | Ht 68.0 in | Wt 183.0 lb

## 2012-07-25 DIAGNOSIS — S065X9A Traumatic subdural hemorrhage with loss of consciousness of unspecified duration, initial encounter: Secondary | ICD-10-CM

## 2012-07-25 DIAGNOSIS — X58XXXA Exposure to other specified factors, initial encounter: Secondary | ICD-10-CM | POA: Insufficient documentation

## 2012-07-25 DIAGNOSIS — F0390 Unspecified dementia without behavioral disturbance: Secondary | ICD-10-CM

## 2012-07-25 DIAGNOSIS — I10 Essential (primary) hypertension: Secondary | ICD-10-CM

## 2012-07-25 DIAGNOSIS — I6200 Nontraumatic subdural hemorrhage, unspecified: Secondary | ICD-10-CM

## 2012-07-25 DIAGNOSIS — S065XAA Traumatic subdural hemorrhage with loss of consciousness status unknown, initial encounter: Secondary | ICD-10-CM | POA: Insufficient documentation

## 2012-07-25 MED ORDER — DONEPEZIL HCL 10 MG PO TABS: 10.0000 mg | ORAL_TABLET | Freq: Every day | ORAL | Status: DC

## 2012-07-25 NOTE — Patient Instructions (Signed)
CALL ME IF YOU DON'T HEAR FROM  DRIVER [facility]

## 2012-07-25 NOTE — Progress Notes (Signed)
Subjective:    Patient ID: [name], male    DOB: 07/22/1919, 79 y.o.   MRN: [id number]  HPI [name] is back regarding his SDH. He was with [place] on inpatient rehab a few weeks ago. He was discharged to home with full supervision.   He takes a small dose (amount?) of klonopin at night to help with sleep. His incontinence is improving. He still wears a depends at night.   He is walking without a device. He has driven with the supervision of his wife. He has driven locally to the doctor only. There were no issues according to his wife or his won. He was fairly oriented while driving.   From a cognitive standpoint, he premorbidly had difficulties with math, balancing the checkbook, recalling dates. Otherwise his wife feels he is close to baseline although he had experienced a decline over the last year or so.   Therapy has completed apparently per wife.   Pain Inventory Average Pain 0 Pain Right Now 0 My pain is n/a  In the last 24 hours, has pain interfered with the following? General activity 10 Relation with others 9 Enjoyment of life 10 What TIME of day is your pain at its worst? n/a Sleep (in general) Fair  Pain is worse with: n/a Pain improves with: n/a Relief from Meds: n/a  Mobility walk without assistance how many minutes can you walk? as usual Do you have any goals in this area?  yes  Function retired  Neuro/Psych anxiety  Prior Studies Any changes since last visit?  no  Physicians involved in your care Any changes since last visit?  no   History reviewed. No pertinent family history. History   Social History  . Marital Status: Married    Spouse Name: N/A    Number of Children: N/A  . Years of Education: N/A   Social History Main Topics  . Smoking status: Never Smoker   . Smokeless tobacco: None  . Alcohol Use: Yes     Comment: occ  . Drug Use: No  . Sexually Active:    Other Topics Concern  . None   Social History Narrative  .  None   Past Surgical History  Procedure Date  . Eye surgery    Past Medical History  Diagnosis Date  . Dementia   . A-fib   . Enlarged prostate   . Thyroid disease     hypothyroid  . Hypertension    BP 108/47  Pulse 80  Resp 14  Ht 5\' 8"  (1.727 m)  Wt 183 lb (83.008 kg)  BMI 27.82 kg/m2  SpO2 97%     Review of Systems  Psychiatric/Behavioral: The patient is nervous/anxious.   All other systems reviewed and are negative.       Objective:   Physical Exam  General: Alert and oriented x 3, No apparent distress HEENT: Head is normocephalic, atraumatic, PERRLA, EOMI, sclera anicteric, oral mucosa pink and moist, dentition intact, ext ear canals clear,  Neck: Supple without JVD or lymphadenopathy Heart: Reg rate and rhythm. No murmurs rubs or gallops Chest: CTA bilaterally without wheezes, rales, or rhonchi; no distress Abdomen: Soft, non-tender, non-distended, bowel sounds positive. Extremities: No clubbing, cyanosis, or edema. Pulses are 2+ Skin: Clean and intact without signs of breakdown Neuro: Pt is cognitively appropriate with normal insight, memory, and awareness. Cranial nerves 2-12 are intact. Sensory exam is normal. Reflexes are 2+ in all 4's. Fine motor coordination is intact. No tremors. Motor  function is grossly 5/5. Balance was fair. He had difficulty with heel to toe gait. He had difficulties with orientating to date, month, year. General STM deficits. Could not remember any of 3 objects after 5 minutes. Fair insight and awareness.  Musculoskeletal: Full ROM, No pain with AROM or PROM in the neck, trunk, or extremities. Posture appropriate Psych: Pt's affect is appropriate. Pt is cooperative         Assessment & Plan:  Left frontoparietal subdural hematoma with  subdural drain, sequential compression devices for deep vein thrombosis  prophylaxis, mild dementia, atrial fibrillation, hypertension, benign  prostatic hypertrophy, hypothyroidism,  hyperlipidemia, gout   1. Made a referral to driver rehab. I don't feel that he is safe to drive unsupervised and given his dx will not likely be in the long term. No driving recommended at this time. 2. Increased aricept to 10mg  and changed scheduling to qhs 3. Follow up with me in 2 months 4. 30 minutes of face to face patient care time were spent during this visit. All questions were encouraged and answered.

## 2012-08-07 ENCOUNTER — Emergency Department (HOSPITAL_COMMUNITY)
Admission: EM | Admit: 2012-08-07 | Discharge: 2012-08-07 | Disposition: A | Discharge status: Home / Self Care | Payer: Medicare Other | Attending: Emergency Medicine | Admitting: Emergency Medicine

## 2012-08-07 ENCOUNTER — Encounter (HOSPITAL_COMMUNITY): Payer: Self-pay | Admitting: Emergency Medicine

## 2012-08-07 ENCOUNTER — Emergency Department (HOSPITAL_COMMUNITY): Payer: Medicare Other

## 2012-08-07 DIAGNOSIS — R0781 Pleurodynia: Secondary | ICD-10-CM

## 2012-08-07 DIAGNOSIS — E039 Hypothyroidism, unspecified: Secondary | ICD-10-CM | POA: Insufficient documentation

## 2012-08-07 DIAGNOSIS — F0390 Unspecified dementia without behavioral disturbance: Secondary | ICD-10-CM | POA: Insufficient documentation

## 2012-08-07 DIAGNOSIS — N400 Enlarged prostate without lower urinary tract symptoms: Secondary | ICD-10-CM | POA: Insufficient documentation

## 2012-08-07 DIAGNOSIS — I10 Essential (primary) hypertension: Secondary | ICD-10-CM | POA: Insufficient documentation

## 2012-08-07 DIAGNOSIS — Z79899 Other long term (current) drug therapy: Secondary | ICD-10-CM | POA: Insufficient documentation

## 2012-08-07 DIAGNOSIS — I4891 Unspecified atrial fibrillation: Secondary | ICD-10-CM

## 2012-08-07 DIAGNOSIS — R071 Chest pain on breathing: Secondary | ICD-10-CM | POA: Insufficient documentation

## 2012-08-07 LAB — BASIC METABOLIC PANEL
BUN: 25 mg/dL — ABNORMAL HIGH (ref 6–23)
CO2: 26 mEq/L (ref 19–32)
Chloride: 101 mEq/L (ref 96–112)
Creatinine, Ser: 1.09 mg/dL (ref 0.50–1.35)
Glucose, Bld: 88 mg/dL (ref 70–99)
Potassium: 3.7 mEq/L (ref 3.5–5.1)

## 2012-08-07 LAB — CBC WITH DIFFERENTIAL/PLATELET
Basophils Relative: 1 % (ref 0–1)
HCT: 32.9 % — ABNORMAL LOW (ref 39.0–52.0)
Hemoglobin: 11.1 g/dL — ABNORMAL LOW (ref 13.0–17.0)
Lymphocytes Relative: 30 % (ref 12–46)
Lymphs Abs: 1.5 10*3/uL (ref 0.7–4.0)
MCHC: 33.7 g/dL (ref 30.0–36.0)
Monocytes Absolute: 0.6 10*3/uL (ref 0.1–1.0)
Monocytes Relative: 11 % (ref 3–12)
Neutro Abs: 2.9 10*3/uL (ref 1.7–7.7)
RBC: 3.24 MIL/uL — ABNORMAL LOW (ref 4.22–5.81)

## 2012-08-07 MED ORDER — NAPROXEN 375 MG PO TABS: 375.0000 mg | ORAL_TABLET | Freq: Two times a day (BID) | ORAL | Status: DC | PRN

## 2012-08-07 MED ORDER — TECHNETIUM TC 99M DIETHYLENETRIAME-PENTAACETIC ACID: 41.0000 | Freq: Once | INTRAVENOUS | Status: DC | PRN

## 2012-08-07 MED ORDER — TECHNETIUM TO 99M ALBUMIN AGGREGATED
6.0000 | Freq: Once | INTRAVENOUS | Status: AC | PRN
  Administered 2012-08-07: 6 via INTRAVENOUS

## 2012-08-07 NOTE — ED Provider Notes (Signed)
History     CSN: [id number]  Arrival date & time 08/07/12  1125   First MD Initiated Contact with Patient 08/07/12 1215      Chief Complaint  Patient presents with  . Chest Pain    (Consider location/radiation/quality/duration/timing/severity/associated sxs/prior treatment) Patient is a 79 y.o. male presenting with chest pain. The history is provided by the patient.  Chest Pain He has been having intermittent chest pain for the last 3 days. Pain is occurring mainly at night but sometimes during the day. When present, it is in the right side of his chest but moves to various locations in the chest. It is relatively mild and he rates the pain at 3/10 at its worst. When present, it is worse with a deep breath and sometimes with certain movements. It lasts from 15-30 minutes before resolving. It is not affected by body position. There is mild associated dyspnea but no nausea or diaphoresis. He denies any cough. Denies fever. Of note, he had been anticoagulated with warfarin for atrial fibrillation and still he had a subdural hematoma in January 5. This did require surgical intervention and he has been off of all anticoagulants since then.  Past Medical History  Diagnosis Date  . Dementia   . A-fib   . Enlarged prostate   . Thyroid disease     hypothyroid  . Hypertension     Past Surgical History  Procedure Laterality Date  . Eye surgery      No family history on file.  History  Substance Use Topics  . Smoking status: Never Smoker   . Smokeless tobacco: Not on file  . Alcohol Use: Yes     Comment: occ      Review of Systems  Cardiovascular: Positive for chest pain.  All other systems reviewed and are negative.    Allergies  Contrast media  Home Medications   Current Outpatient Rx  Name  Route  Sig  Dispense  Refill  . Cholecalciferol (VITAMIN D PO)   Oral   Take 1 tablet by mouth every morning.         . donepezil (ARICEPT) 10 MG tablet   Oral   Take 10  mg by mouth daily.         Marland Kitchen doxazosin (CARDURA) 2 MG tablet   Oral   Take 2 mg by mouth at bedtime.         . finasteride (PROSCAR) 5 MG tablet   Oral   Take 5 mg by mouth at bedtime.         . hydrochlorothiazide (HYDRODIURIL) 25 MG tablet   Oral   Take 12.5 mg by mouth daily.         Marland Kitchen levothyroxine (SYNTHROID, LEVOTHROID) 50 MCG tablet   Oral   Take 50 mcg by mouth daily at 12 noon.         . metoprolol (LOPRESSOR) 50 MG tablet   Oral   Take 25 mg by mouth 2 (two) times daily.         . simvastatin (ZOCOR) 40 MG tablet   Oral   Take 20 mg by mouth every evening.           BP 115/65  Pulse 55  Resp 18  SpO2 94%  Physical Exam  Nursing note and vitals reviewed.  79 year old male, resting comfortably and in no acute distress. Vital signs are significant for bradycardia with heart rate of 55. Oxygen saturation is 94%, which  is normal. Head is normocephalic and atraumatic. PERRLA, EOMI. Oropharynx is clear. Neck is nontender and supple without adenopathy or JVD. Back is nontender and there is no CVA tenderness. Lungs are clear without rales, wheezes, or rhonchi. Chest is nontender. Heart has regular rate and rhythm without murmur. Abdomen is soft, flat, nontender without masses or hepatosplenomegaly and peristalsis is normoactive. Extremities have 2+ edema, full range of motion is present. Skin is warm and dry without rash. Neurologic: Mental status is normal, cranial nerves are intact, there are no motor or sensory deficits.  ED Course  Procedures (including critical care time)  Results for orders placed during the hospital encounter of 08/07/12  CBC WITH DIFFERENTIAL      Result Value Range   WBC 5.1  4.0 - 10.5 K/uL   RBC 3.24 (*) 4.22 - 5.81 MIL/uL   Hemoglobin 11.1 (*) 13.0 - 17.0 g/dL   HCT 84.6 (*) 96.2 - 95.2 %   MCV 101.5 (*) 78.0 - 100.0 fL   MCH 34.3 (*) 26.0 - 34.0 pg   MCHC 33.7  30.0 - 36.0 g/dL   RDW 84.1  32.4 - 40.1 %    Platelets 142 (*) 150 - 400 K/uL   Neutrophils Relative 56  43 - 77 %   Neutro Abs 2.9  1.7 - 7.7 K/uL   Lymphocytes Relative 30  12 - 46 %   Lymphs Abs 1.5  0.7 - 4.0 K/uL   Monocytes Relative 11  3 - 12 %   Monocytes Absolute 0.6  0.1 - 1.0 K/uL   Eosinophils Relative 1  0 - 5 %   Eosinophils Absolute 0.1  0.0 - 0.7 K/uL   Basophils Relative 1  0 - 1 %   Basophils Absolute 0.0  0.0 - 0.1 K/uL  BASIC METABOLIC PANEL      Result Value Range   Sodium 136  135 - 145 mEq/L   Potassium 3.7  3.5 - 5.1 mEq/L   Chloride 101  96 - 112 mEq/L   CO2 26  19 - 32 mEq/L   Glucose, Bld 88  70 - 99 mg/dL   BUN 25 (*) 6 - 23 mg/dL   Creatinine, Ser 0.27  0.50 - 1.35 mg/dL   Calcium 9.0  8.4 - 25.3 mg/dL   GFR calc non Af Amer 58 (*) >90 mL/min   GFR calc Af Amer 67 (*) >90 mL/min  TROPONIN I      Result Value Range   Troponin I <0.30  <0.30 ng/mL   Dg Chest 2 View  08/07/2012  *RADIOLOGY REPORT*  Clinical Data: Chest pain.  CHEST - 2 VIEW  Comparison: 06/25/2012.  Findings: Heart is enlarged but stable.  There is tortuosity and calcification of the thoracic aorta.  The lungs are clear.  No pleural effusion.  The bony thorax is intact. Stable appearing compression fractures of T12 and L1.  IMPRESSION: Cardiac enlargement but no acute pulmonary findings.   Original Report Authenticated By: Rudie Meyer, M.D.    Nm Pulmonary Perf And Vent  08/07/2012  *RADIOLOGY REPORT*  Clinical Data: Chest pain  NM PULMONARY VENTILATION AND PERFUSION SCANS  Views:  Anterior, posterior, left lateral, right lateral, RPO, LPO, RAO, LAO - ventilation and perfusion  Radiopharmaceutical: Technetium 14m DTPA - ventilation; technetium 61m macroaggregated albumin - perfusion  Dose:  41.0 mCi - ventilation; 6.0 mCi - perfusion  Route of administration:  Inhalation - ventilation; intravenous - perfusion  Comparison: Chest radiograph August 07, 2012  Findings: The ventilation study shows homogeneous and symmetric uptake of  radiotracer bilaterally.  On the perfusion study, there is homogeneous and symmetric uptake of radiotracer bilaterally.  There is no appreciable ventilation / perfusion mismatch.    IMPRESSION:  Normal ventilation and perfusion lung scans.  Very low probability of pulmonary embolus.   Original Report Authenticated By: Bretta Bang, M.D.       Date: 08/07/2012  Rate: 55  Rhythm: atrial fibrillation  QRS Axis: left  Intervals: normal  ST/T Wave abnormalities: normal  Conduction Disutrbances:none  Narrative Interpretation: Atrial fibrillation with slow ventricular response, borderline left axis deviation, no prior ECG available for comparison.  Old EKG Reviewed: none available    1. Pleuritic chest pain   2. Atrial fibrillation       MDM  Intermittent chest pain of uncertain cause. Chest x-ray will be checked. He is allergic to intravenous contrast, so lung scan will be obtained. Old records are reviewed and he was indeed admitted for subdural hematoma associated with supratherapeutic anticoagulation.  Workup is negative including normal lung scan. Pain is apparently due 2 pleurisy and will be treated with oral NSAIDs. He is given a prescription for naproxen.     Dione Booze, MD 08/07/12 [phone]

## 2012-08-07 NOTE — ED Notes (Signed)
Pt states that he has had intermittent right sided chest pain for "past couple of days".  Pt thought it was from anxiety and it would go away. Pt went to [facility] in [place] and was instructed to be seen in ED for further eval due to brain surgery on June 24, 2012. pt refused to go by ambulance so pt came by POV.

## 2012-08-07 NOTE — ED Notes (Signed)
Pt transported to Nuclear studies. Nuc Tech said pt will be gone approx .

## 2012-09-26 ENCOUNTER — Ambulatory Visit
Admission: RE | Admit: 2012-09-26 | Discharge: 2012-09-26 | Disposition: A | Discharge status: Home / Self Care | Payer: Medicare Other | Source: Ambulatory Visit | Attending: Neurosurgery | Admitting: Neurosurgery

## 2012-09-26 ENCOUNTER — Other Ambulatory Visit: Payer: Self-pay | Admitting: Neurosurgery

## 2012-09-26 DIAGNOSIS — I609 Nontraumatic subarachnoid hemorrhage, unspecified: Secondary | ICD-10-CM

## 2012-09-27 ENCOUNTER — Other Ambulatory Visit (HOSPITAL_COMMUNITY): Payer: Self-pay | Admitting: Neurosurgery

## 2012-09-27 DIAGNOSIS — S065X9A Traumatic subdural hemorrhage with loss of consciousness of unspecified duration, initial encounter: Secondary | ICD-10-CM

## 2012-09-27 DIAGNOSIS — S065XAA Traumatic subdural hemorrhage with loss of consciousness status unknown, initial encounter: Secondary | ICD-10-CM

## 2012-10-04 ENCOUNTER — Encounter (HOSPITAL_COMMUNITY): Payer: Self-pay

## 2012-10-04 ENCOUNTER — Ambulatory Visit (HOSPITAL_COMMUNITY)
Admission: RE | Admit: 2012-10-04 | Discharge: 2012-10-04 | Disposition: A | Discharge status: Home / Self Care | Payer: Medicare Other | Source: Ambulatory Visit | Attending: Neurosurgery | Admitting: Neurosurgery

## 2012-10-04 DIAGNOSIS — S065X0A Traumatic subdural hemorrhage without loss of consciousness, initial encounter: Secondary | ICD-10-CM | POA: Insufficient documentation

## 2012-10-04 DIAGNOSIS — S065X9A Traumatic subdural hemorrhage with loss of consciousness of unspecified duration, initial encounter: Secondary | ICD-10-CM

## 2012-10-04 DIAGNOSIS — F0390 Unspecified dementia without behavioral disturbance: Secondary | ICD-10-CM | POA: Insufficient documentation

## 2012-10-04 DIAGNOSIS — X58XXXA Exposure to other specified factors, initial encounter: Secondary | ICD-10-CM | POA: Insufficient documentation

## 2012-10-15 ENCOUNTER — Encounter: Payer: Self-pay | Admitting: Physical Medicine & Rehabilitation

## 2012-10-15 ENCOUNTER — Encounter: Payer: Medicare Other | Attending: Physical Medicine & Rehabilitation | Admitting: Physical Medicine & Rehabilitation

## 2012-10-15 VITALS — BP 106/54 | HR 62 | Resp 16 | Ht 68.0 in | Wt 179.0 lb

## 2012-10-15 DIAGNOSIS — I6200 Nontraumatic subdural hemorrhage, unspecified: Secondary | ICD-10-CM

## 2012-10-15 DIAGNOSIS — S065X9A Traumatic subdural hemorrhage with loss of consciousness of unspecified duration, initial encounter: Secondary | ICD-10-CM

## 2012-10-15 DIAGNOSIS — X58XXXA Exposure to other specified factors, initial encounter: Secondary | ICD-10-CM | POA: Insufficient documentation

## 2012-10-15 DIAGNOSIS — F0390 Unspecified dementia without behavioral disturbance: Secondary | ICD-10-CM

## 2012-10-15 DIAGNOSIS — S065XAA Traumatic subdural hemorrhage with loss of consciousness status unknown, initial encounter: Secondary | ICD-10-CM | POA: Insufficient documentation

## 2012-10-15 NOTE — Patient Instructions (Signed)
CALL ME WITH ANY PROBLEMS OR QUESTIONS (#[phone]).  HAVE A GOOD DAY   NO DRIVING!!!

## 2012-10-15 NOTE — Progress Notes (Signed)
Subjective:    Patient ID: [name], male    DOB: July 04, 1919, 79 y.o.   MRN: [id number]  HPI  [name] is back regarding his TBI. He hasn't gone for his driving evaluation due to issues with his son who developed brain cancer. He has accepted the fact that his family doesn't want him to drive, but he himself still has interest in doing so.   He hasn't had any pain or problems with balance.     Pain Inventory Average Pain 0 Pain Right Now 0 My pain is n/a  In the last 24 hours, has pain interfered with the following? General activity 0 Relation with others 0 Enjoyment of life 0 What TIME of day is your pain at its worst? n/a Sleep (in general) Fair  Pain is worse with: n/a Pain improves with: n/a Relief from Meds: n/a  Mobility walk without assistance ability to climb steps?  yes do you drive?  no  Function retired  Neuro/Psych bladder control problems  Prior Studies Any changes since last visit?  no  Physicians involved in your care Any changes since last visit?  no   History reviewed. No pertinent family history. History   Social History  . Marital Status: Married    Spouse Name: N/A    Number of Children: N/A  . Years of Education: N/A   Social History Main Topics  . Smoking status: Never Smoker   . Smokeless tobacco: None  . Alcohol Use: Yes     Comment: occ  . Drug Use: No  . Sexually Active:    Other Topics Concern  . None   Social History Narrative  . None   Past Surgical History  Procedure Laterality Date  . Eye surgery     Past Medical History  Diagnosis Date  . Dementia   . A-fib   . Enlarged prostate   . Thyroid disease     hypothyroid  . Hypertension    BP 106/54  Pulse 62  Resp 16  Ht 5\' 8"  (1.727 m)  Wt 179 lb (81.194 kg)  BMI 27.22 kg/m2  SpO2 96%     Review of Systems  Genitourinary: Positive for difficulty urinating.  All other systems reviewed and are negative.       Objective:   Physical Exam General: Alert and oriented x 3, No apparent distress  HEENT: Head is normocephalic, atraumatic, PERRLA, EOMI, sclera anicteric, oral mucosa pink and moist, dentition intact, ext ear canals clear,  Neck: Supple without JVD or lymphadenopathy  Heart: Reg rate and rhythm. No murmurs rubs or gallops  Chest: CTA bilaterally without wheezes, rales, or rhonchi; no distress  Abdomen: Soft, non-tender, non-distended, bowel sounds positive.  Extremities: No clubbing, cyanosis, or edema. Pulses are 2+  Skin: Clean and intact without signs of breakdown  Neuro: Pt is cognitively appropriate with normal insight, memory, and awareness. Cranial nerves 2-12 are intact. Sensory exam is normal. Reflexes are 2+ in all 4's. Fine motor coordination is intact. No tremors. Motor function is grossly 5/5. Balance was fair. He still had difficulty with heel to toe gait.  He could not remember the month or day or year. Could not remember any of 3 objects after 5 minutes. Fair insight and awareness. Hides memory deficits Musculoskeletal: Full ROM, No pain with AROM or PROM in the neck, trunk, or extremities. Posture appropriate  Psych: Pt's affect is appropriate. Pt is cooperative   Assessment & Plan:   Left frontoparietal subdural  hematoma with  subdural drain, sequential compression devices for deep vein thrombosis  prophylaxis, mild dementia, atrial fibrillation, hypertension, benign  prostatic hypertrophy, hypothyroidism, hyperlipidemia, gout   1. They can still consider driving evaluation. I would not allow him to drive without this clearance. If nothing else an evaluation would give finality to this debate.  2. Maintain aricept at 10mg  qhs  3. Follow up with me in 2 months  4. 30 minutes of face to face patient care time were spent during this visit. All questions were encouraged and answered.

## 2012-12-12 ENCOUNTER — Other Ambulatory Visit: Payer: Self-pay | Admitting: Internal Medicine

## 2012-12-12 DIAGNOSIS — K219 Gastro-esophageal reflux disease without esophagitis: Secondary | ICD-10-CM

## 2012-12-13 ENCOUNTER — Other Ambulatory Visit: Payer: Self-pay | Admitting: Internal Medicine

## 2012-12-13 ENCOUNTER — Ambulatory Visit
Admission: RE | Admit: 2012-12-13 | Discharge: 2012-12-13 | Disposition: A | Discharge status: Home / Self Care | Payer: Medicare Other | Source: Ambulatory Visit | Attending: Internal Medicine | Admitting: Internal Medicine

## 2012-12-13 DIAGNOSIS — K219 Gastro-esophageal reflux disease without esophagitis: Secondary | ICD-10-CM

## 2013-02-14 ENCOUNTER — Emergency Department (HOSPITAL_COMMUNITY): Payer: Medicare Other

## 2013-02-14 ENCOUNTER — Encounter (HOSPITAL_COMMUNITY): Payer: Self-pay

## 2013-02-14 ENCOUNTER — Inpatient Hospital Stay (HOSPITAL_COMMUNITY)
Admission: EM | Admit: 2013-02-14 | Discharge: 2013-02-20 | DRG: 065 | Disposition: A | Discharge status: Skilled Nursing Facility | Payer: Medicare Other | Attending: Internal Medicine | Admitting: Internal Medicine

## 2013-02-14 DIAGNOSIS — I6340 Cerebral infarction due to embolism of unspecified cerebral artery: Principal | ICD-10-CM | POA: Diagnosis present

## 2013-02-14 DIAGNOSIS — I472 Ventricular tachycardia, unspecified: Secondary | ICD-10-CM | POA: Diagnosis present

## 2013-02-14 DIAGNOSIS — R569 Unspecified convulsions: Secondary | ICD-10-CM

## 2013-02-14 DIAGNOSIS — R32 Unspecified urinary incontinence: Secondary | ICD-10-CM | POA: Diagnosis present

## 2013-02-14 DIAGNOSIS — I4891 Unspecified atrial fibrillation: Secondary | ICD-10-CM | POA: Diagnosis present

## 2013-02-14 DIAGNOSIS — I359 Nonrheumatic aortic valve disorder, unspecified: Secondary | ICD-10-CM | POA: Diagnosis present

## 2013-02-14 DIAGNOSIS — S065X9A Traumatic subdural hemorrhage with loss of consciousness of unspecified duration, initial encounter: Secondary | ICD-10-CM | POA: Diagnosis present

## 2013-02-14 DIAGNOSIS — I639 Cerebral infarction, unspecified: Secondary | ICD-10-CM

## 2013-02-14 DIAGNOSIS — I6200 Nontraumatic subdural hemorrhage, unspecified: Secondary | ICD-10-CM

## 2013-02-14 DIAGNOSIS — I4819 Other persistent atrial fibrillation: Secondary | ICD-10-CM | POA: Diagnosis present

## 2013-02-14 DIAGNOSIS — N400 Enlarged prostate without lower urinary tract symptoms: Secondary | ICD-10-CM | POA: Diagnosis present

## 2013-02-14 DIAGNOSIS — Z87820 Personal history of traumatic brain injury: Secondary | ICD-10-CM

## 2013-02-14 DIAGNOSIS — E039 Hypothyroidism, unspecified: Secondary | ICD-10-CM | POA: Diagnosis present

## 2013-02-14 DIAGNOSIS — Z66 Do not resuscitate: Secondary | ICD-10-CM | POA: Diagnosis present

## 2013-02-14 DIAGNOSIS — I739 Peripheral vascular disease, unspecified: Secondary | ICD-10-CM

## 2013-02-14 DIAGNOSIS — R001 Bradycardia, unspecified: Secondary | ICD-10-CM

## 2013-02-14 DIAGNOSIS — S065XAA Traumatic subdural hemorrhage with loss of consciousness status unknown, initial encounter: Secondary | ICD-10-CM

## 2013-02-14 DIAGNOSIS — E876 Hypokalemia: Secondary | ICD-10-CM | POA: Diagnosis not present

## 2013-02-14 DIAGNOSIS — I10 Essential (primary) hypertension: Secondary | ICD-10-CM

## 2013-02-14 DIAGNOSIS — R1310 Dysphagia, unspecified: Secondary | ICD-10-CM | POA: Diagnosis present

## 2013-02-14 DIAGNOSIS — I4729 Other ventricular tachycardia: Secondary | ICD-10-CM | POA: Diagnosis present

## 2013-02-14 DIAGNOSIS — F0390 Unspecified dementia without behavioral disturbance: Secondary | ICD-10-CM

## 2013-02-14 LAB — COMPREHENSIVE METABOLIC PANEL
ALT: 9 U/L (ref 0–53)
AST: 16 U/L (ref 0–37)
Albumin: 3.2 g/dL — ABNORMAL LOW (ref 3.5–5.2)
CO2: 30 mEq/L (ref 19–32)
Calcium: 9.6 mg/dL (ref 8.4–10.5)
Chloride: 105 mEq/L (ref 96–112)
Creatinine, Ser: 1.16 mg/dL (ref 0.50–1.35)
Sodium: 142 mEq/L (ref 135–145)
Total Bilirubin: 1.3 mg/dL — ABNORMAL HIGH (ref 0.3–1.2)

## 2013-02-14 LAB — GLUCOSE, CAPILLARY: Comment 3: 9487476

## 2013-02-14 LAB — POCT I-STAT, CHEM 8
BUN: 26 mg/dL — ABNORMAL HIGH (ref 6–23)
Creatinine, Ser: 1.3 mg/dL (ref 0.50–1.35)
Glucose, Bld: 113 mg/dL — ABNORMAL HIGH (ref 70–99)
Potassium: 4.1 mEq/L (ref 3.5–5.1)
Sodium: 142 mEq/L (ref 135–145)

## 2013-02-14 LAB — DIFFERENTIAL
Basophils Absolute: 0 10*3/uL (ref 0.0–0.1)
Basophils Relative: 0 % (ref 0–1)
Lymphocytes Relative: 36 % (ref 12–46)
Monocytes Absolute: 0.5 10*3/uL (ref 0.1–1.0)
Neutro Abs: 3.3 10*3/uL (ref 1.7–7.7)

## 2013-02-14 LAB — MRSA PCR SCREENING: MRSA by PCR: NEGATIVE

## 2013-02-14 LAB — CBC
HCT: 36.2 % — ABNORMAL LOW (ref 39.0–52.0)
MCHC: 33.4 g/dL (ref 30.0–36.0)
Platelets: 125 10*3/uL — ABNORMAL LOW (ref 150–400)
RDW: 14.5 % (ref 11.5–15.5)
WBC: 6.2 10*3/uL (ref 4.0–10.5)

## 2013-02-14 LAB — APTT: aPTT: 28 seconds (ref 24–37)

## 2013-02-14 LAB — PROTIME-INR
INR: 1.15 (ref 0.00–1.49)
Prothrombin Time: 14.5 seconds (ref 11.6–15.2)

## 2013-02-14 LAB — POCT I-STAT TROPONIN I

## 2013-02-14 MED ORDER — METOPROLOL TARTRATE 25 MG PO TABS
25.0000 mg | ORAL_TABLET | Freq: Two times a day (BID) | ORAL | Status: DC
  Filled 2013-02-14 (×2): qty 1

## 2013-02-14 MED ORDER — HYDRALAZINE HCL 20 MG/ML IJ SOLN
10.0000 mg | INTRAMUSCULAR | Status: DC | PRN
  Administered 2013-02-14 – 2013-02-18 (×5): 10 mg via INTRAVENOUS
  Filled 2013-02-14 (×3): qty 1

## 2013-02-14 MED ORDER — SODIUM CHLORIDE 0.9 % IV SOLN
1000.0000 mg | Freq: Once | INTRAVENOUS | Status: AC
  Administered 2013-02-14: 1000 mg via INTRAVENOUS
  Filled 2013-02-14 (×2): qty 10

## 2013-02-14 MED ORDER — SODIUM CHLORIDE 0.9 % IV SOLN
INTRAVENOUS | Status: AC
  Administered 2013-02-14: 75 mL/h via INTRAVENOUS

## 2013-02-14 MED ORDER — HYDRALAZINE HCL 20 MG/ML IJ SOLN
10.0000 mg | Freq: Four times a day (QID) | INTRAMUSCULAR | Status: DC | PRN
  Filled 2013-02-14 (×2): qty 1

## 2013-02-14 MED ORDER — SODIUM CHLORIDE 0.9 % IV SOLN
INTRAVENOUS | Status: DC
  Administered 2013-02-14 – 2013-02-15 (×3): via INTRAVENOUS

## 2013-02-14 MED ORDER — LEVOTHYROXINE SODIUM 25 MCG PO TABS
25.0000 ug | ORAL_TABLET | Freq: Every day | ORAL | Status: DC
  Administered 2013-02-15 – 2013-02-20 (×5): 25 ug via ORAL
  Filled 2013-02-14 (×6): qty 1

## 2013-02-14 MED ORDER — ALBUTEROL SULFATE (5 MG/ML) 0.5% IN NEBU
2.5000 mg | INHALATION_SOLUTION | Freq: Once | RESPIRATORY_TRACT | Status: AC
  Administered 2013-02-14: 2.5 mg via RESPIRATORY_TRACT
  Filled 2013-02-14: qty 0.5

## 2013-02-14 MED ORDER — DONEPEZIL HCL 10 MG PO TABS
10.0000 mg | ORAL_TABLET | Freq: Every day | ORAL | Status: DC
  Filled 2013-02-14: qty 1

## 2013-02-14 MED ORDER — ACETAMINOPHEN 325 MG PO TABS: 650.0000 mg | ORAL_TABLET | Freq: Four times a day (QID) | ORAL | Status: DC | PRN

## 2013-02-14 MED ORDER — FINASTERIDE 5 MG PO TABS
5.0000 mg | ORAL_TABLET | Freq: Every day | ORAL | Status: DC
  Administered 2013-02-15 – 2013-02-19 (×6): 5 mg via ORAL
  Filled 2013-02-14 (×9): qty 1

## 2013-02-14 MED ORDER — ONDANSETRON HCL 4 MG/2ML IJ SOLN
INTRAMUSCULAR | Status: AC
  Filled 2013-02-14: qty 2

## 2013-02-14 MED ORDER — HYDROCHLOROTHIAZIDE 25 MG PO TABS
12.5000 mg | ORAL_TABLET | Freq: Every day | ORAL | Status: DC
  Filled 2013-02-14: qty 0.5

## 2013-02-14 MED ORDER — SIMVASTATIN 20 MG PO TABS
20.0000 mg | ORAL_TABLET | Freq: Every evening | ORAL | Status: DC
  Administered 2013-02-15 – 2013-02-19 (×4): 20 mg via ORAL
  Filled 2013-02-14 (×7): qty 1

## 2013-02-14 MED ORDER — LEVETIRACETAM 500 MG PO TABS
500.0000 mg | ORAL_TABLET | Freq: Two times a day (BID) | ORAL | Status: DC
  Administered 2013-02-14 – 2013-02-15 (×2): 500 mg via ORAL
  Filled 2013-02-14 (×4): qty 1

## 2013-02-14 MED ORDER — ASPIRIN 325 MG PO TABS
325.0000 mg | ORAL_TABLET | Freq: Every day | ORAL | Status: DC
  Filled 2013-02-14: qty 1

## 2013-02-14 MED ORDER — TAMSULOSIN HCL 0.4 MG PO CAPS
0.4000 mg | ORAL_CAPSULE | Freq: Every day | ORAL | Status: DC
  Administered 2013-02-14 – 2013-02-20 (×6): 0.4 mg via ORAL
  Filled 2013-02-14 (×7): qty 1

## 2013-02-14 MED ORDER — DOXAZOSIN MESYLATE 2 MG PO TABS
2.0000 mg | ORAL_TABLET | Freq: Every day | ORAL | Status: DC
  Filled 2013-02-14: qty 1

## 2013-02-14 MED ORDER — ONDANSETRON HCL 4 MG/2ML IJ SOLN
4.0000 mg | Freq: Four times a day (QID) | INTRAMUSCULAR | Status: DC | PRN
  Administered 2013-02-14: 4 mg via INTRAVENOUS

## 2013-02-14 MED ORDER — NAPROXEN 375 MG PO TABS
375.0000 mg | ORAL_TABLET | Freq: Two times a day (BID) | ORAL | Status: DC | PRN
  Filled 2013-02-14: qty 1

## 2013-02-14 MED ORDER — HYDROCODONE-ACETAMINOPHEN 5-325 MG PO TABS: 1.0000 | ORAL_TABLET | ORAL | Status: DC | PRN

## 2013-02-14 MED ORDER — METOPROLOL TARTRATE 12.5 MG HALF TABLET
12.5000 mg | ORAL_TABLET | Freq: Two times a day (BID) | ORAL | Status: DC
  Filled 2013-02-14: qty 1

## 2013-02-14 MED ORDER — HYDRALAZINE HCL 20 MG/ML IJ SOLN
10.0000 mg | Freq: Once | INTRAMUSCULAR | Status: AC
  Administered 2013-02-14: 10 mg via INTRAVENOUS

## 2013-02-14 MED ORDER — ACETAMINOPHEN 650 MG RE SUPP: 650.0000 mg | Freq: Four times a day (QID) | RECTAL | Status: DC | PRN

## 2013-02-14 MED ORDER — RISPERIDONE 1 MG PO TBDP
1.0000 mg | ORAL_TABLET | Freq: Every day | ORAL | Status: DC
  Administered 2013-02-14 – 2013-02-19 (×5): 1 mg via ORAL
  Filled 2013-02-14 (×7): qty 1

## 2013-02-14 MED ORDER — HYDRALAZINE HCL 20 MG/ML IJ SOLN
INTRAMUSCULAR | Status: AC
  Filled 2013-02-14: qty 1

## 2013-02-14 MED ORDER — FUROSEMIDE 10 MG/ML IJ SOLN
20.0000 mg | Freq: Every day | INTRAMUSCULAR | Status: DC
  Administered 2013-02-14: 20 mg via INTRAVENOUS
  Filled 2013-02-14 (×2): qty 2

## 2013-02-14 MED ORDER — METOPROLOL TARTRATE 12.5 MG HALF TABLET
12.5000 mg | ORAL_TABLET | Freq: Two times a day (BID) | ORAL | Status: DC
  Administered 2013-02-14 – 2013-02-16 (×6): 12.5 mg via ORAL
  Filled 2013-02-14 (×8): qty 1

## 2013-02-14 MED ORDER — LEVOTHYROXINE SODIUM 50 MCG PO TABS: 50.0000 ug | ORAL_TABLET | Freq: Every day | ORAL | Status: DC

## 2013-02-14 NOTE — Consult Note (Signed)
Referring Physician: Ranae Palms    Chief Complaint: Code Stroke  HPI:                                                                                                                                         [name] is an 79 y.o. male who was normal last night.  When he woke up this AM wife noted he was not as talkative as usual but she though nothing of it.  She went to take a shower and when she finished he was in the bedroom and not following commands.  She noted a right facial droop also.  Patient has a home health nurse who cares for her son at home who recommended they call EMS.  There was no jerking or tonic clonic movement noted but patient was incontinent. EMS was called and patient was brought to hospital as a code stroke. Initial CT head showed chronic left subdural hemorrhage with superimposed acute or subacute bleed.   Of note: patient is in Afib and has been in Afib since last bleed 06/23/12   Date last known well: Date: 02/13/2013 Time last known well: Time: 11:00 tPA Given: No: SDH  Past Medical History  Diagnosis Date  . Dementia   . A-fib   . Enlarged prostate   . Thyroid disease     hypothyroid  . Hypertension     Past Surgical History  Procedure Laterality Date  . Eye surgery      Family history: Unable to provide secondary to dementia  Social History:  reports that he has never smoked. He does not have any smokeless tobacco history on file. He reports that  drinks alcohol. He reports that he does not use illicit drugs.  Allergies:  Allergies  Allergen Reactions  . Contrast Media [Iodinated Diagnostic Agents]     rash    Medications:                                                                                                                           No current facility-administered medications for this encounter.   Current Outpatient Prescriptions  Medication Sig Dispense Refill  . Cholecalciferol (VITAMIN D PO) Take 1 tablet by mouth every  morning.      . donepezil (ARICEPT) 10 MG tablet Take 10 mg by mouth daily.      Marland Kitchen  doxazosin (CARDURA) 2 MG tablet Take 2 mg by mouth at bedtime.      . finasteride (PROSCAR) 5 MG tablet Take 5 mg by mouth at bedtime.      . hydrochlorothiazide (HYDRODIURIL) 25 MG tablet Take 12.5 mg by mouth daily.      Marland Kitchen levothyroxine (SYNTHROID, LEVOTHROID) 50 MCG tablet Take 50 mcg by mouth daily at 12 noon.      . metoprolol (LOPRESSOR) 50 MG tablet Take 25 mg by mouth 2 (two) times daily.      . naproxen (NAPROSYN) 375 MG tablet Take 1 tablet (375 mg total) by mouth 2 (two) times daily as needed (Pain).  20 tablet  0  . simvastatin (ZOCOR) 40 MG tablet Take 20 mg by mouth every evening.         ROS:                                                                                                                                       History obtained from pateints wife  General ROS: negative for - chills, fatigue, fever, night sweats, weight gain or weight loss Psychological ROS: negative for - behavioral disorder, hallucinations, memory difficulties, mood swings or suicidal ideation Ophthalmic ROS: negative for - blurry vision, double vision, eye pain or loss of vision ENT ROS: negative for - epistaxis, nasal discharge, oral lesions, sore throat, tinnitus or vertigo Allergy and Immunology ROS: negative for - hives or itchy/watery eyes Hematological and Lymphatic ROS: negative for - bleeding problems, bruising or swollen lymph nodes Endocrine ROS: negative for - galactorrhea, hair pattern changes, polydipsia/polyuria or temperature intolerance Respiratory ROS: negative for - cough, hemoptysis, shortness of breath or wheezing Cardiovascular ROS: negative for - chest pain, dyspnea on exertion, edema or irregular heartbeat Gastrointestinal ROS: negative for - abdominal pain, diarrhea, hematemesis, nausea/vomiting or stool incontinence Genito-Urinary ROS: negative for - dysuria, hematuria, incontinence or  urinary frequency/urgency Musculoskeletal ROS: negative for - joint swelling or muscular weakness Neurological ROS: as noted in HPI Dermatological ROS: negative for rash and skin lesion changes   Neurologic Examination:                                                                                                      Blood pressure 156/97, pulse 67, resp. rate 16, SpO2 98.00%.  Mental Status: Alert, follows visual commands, shows difficulty following verbal commands but speech is fluent when talking.  He is able to name objects and tell me what  they are used for but cannot follow verbal commands.  Cranial Nerves: II: Discs flat bilaterally; Visual fields grossly normal, pupils equal, round, reactive to light and accommodation III,IV, VI: ptosis not present, extra-ocular motions intact bilaterally V,VII: smile asymmetric on the right, facial light touch sensation normal bilaterally VIII: hearing normal bilaterally IX,X: gag reflex present XI: bilateral shoulder shrug XII: midline tongue extension Motor: Right : Upper extremity   5/5    Left:     Upper extremity   5/5  Lower extremity   5/5     Lower extremity   5/5 Tone and bulk:normal tone throughout; no atrophy noted Sensory: Pinprick and light touch intact throughout, bilaterally Deep Tendon Reflexes:  Right: Upper Extremity   Left: Upper extremity   biceps (C-5 to C-6) 2/4   biceps (C-5 to C-6) 2/4 tricep (C7) 2/4    triceps (C7) 2/4 Brachioradialis (C6) 2/4  Brachioradialis (C6) 2/4  Lower Extremity Lower Extremity  quadriceps (L-2 to L-4) 1/4   quadriceps (L-2 to L-4) 1/4 Achilles (S1) 1/4   Achilles (S1) 1/4  Plantars: Right: downgoing   Left: downgoing Cerebellar: normal finger-to-nose,  normal heel-to-shin test Gait: not tested CV: pulses palpable throughout    Results for orders placed during the hospital encounter of 02/14/13 (from the past 48 hour(s))  GLUCOSE, CAPILLARY     Status: Abnormal   Collection  Time    02/14/13 10:58 AM      Result Value Range   Glucose-Capillary 114 (*) 70 - 99 mg/dL   Comment 1 Notify RN     Comment 2 Documented in Chart     Comment 3 [phone]    POCT I-STAT TROPONIN I     Status: None   Collection Time    02/14/13 11:17 AM      Result Value Range   Troponin i, poc 0.01  0.00 - 0.08 ng/mL   Comment 3            Comment: Due to the release kinetics of cTnI,     a negative result within the first hours     of the onset of symptoms does not rule out     myocardial infarction with certainty.     If myocardial infarction is still suspected,     repeat the test at appropriate intervals.  POCT I-STAT, CHEM 8     Status: Abnormal   Collection Time    02/14/13 11:19 AM      Result Value Range   Sodium 142  135 - 145 mEq/L   Potassium 4.1  3.5 - 5.1 mEq/L   Chloride 103  96 - 112 mEq/L   BUN 26 (*) 6 - 23 mg/dL   Creatinine, Ser 3.08  0.50 - 1.35 mg/dL   Glucose, Bld 657 (*) 70 - 99 mg/dL   Calcium, Ion 8.46  9.62 - 1.30 mmol/L   TCO2 30  0 - 100 mmol/L   Hemoglobin 11.9 (*) 13.0 - 17.0 g/dL   HCT 95.2 (*) 84.1 - 32.4 %   Ct Head (brain) Wo Contrast  02/14/2013   *RADIOLOGY REPORT*  Clinical Data: History of subdural hemorrhage.  CT HEAD WITHOUT CONTRAST  Technique:  Contiguous axial images were obtained from the base of the skull through the vertex without contrast.  Comparison: Head CT scan 10/04/2012.  Findings: The patient has a mixed attenuation extra-axial fluid collection over the left convexities consistent with chronic subdural hemorrhage with superimposed acute or subacute bleed.  The collection measures up to 1.7 cm in diameter compared to 2.3 cm on the prior study.  There is a left right midline shift of 0.5 cm compared to 0.7 cm on the prior exam.  The brain is atrophic with chronic microvascular ischemic change. No evidence of acute infarction, mass lesion, hydrocephalus or pneumocephalus is seen.  The calvarium is intact.  Imaged paranasal sinuses  are clear.  IMPRESSION: Chronic left subdural hemorrhage with superimposed acute or subacute bleed.  Associated 0.5 cm of left to right midline shift is identified.  Critical Value/emergent results were called by telephone at the time of interpretation on 02/14/2013 at 11:22 a.m. to Dr. Ranae Palms, who verbally acknowledged these results.   Original Report Authenticated By: Holley Dexter, M.D.    Felicie Morn PA-C [facility] [phone]  02/14/2013, 11:30 AM   Patient seen and examined.  Clinical course and management discussed.  Necessary edits performed.  I agree with the above.  Assessment and plan of care developed and discussed below.   Assessment: 79 y.o. male presenting with right facial droop and difficulty with speech.  CT of the head reviewed and shows a chronic left SDH and superimposed acute to subacute blood as well.  Midline shift is present but not worse since previous imaging.  Due to acute hemorrhage present patient is not a tPA candidate.  Patient outside of the time window as well.  Patient on no anticoagulation.  Would not be a candidate for neurosurgical intervention at this time.   Patient's constellation of presenting symptoms may very well have represented a seizure..  Imaging results are quite consistent with this.  Antiepileptic medication would be appropriate at this time.    Stroke Risk Factors - atrial fibrillation and hypertension  Plan: 1.  Repeat head CT in AM 2.  Keppra 1000mg  IV now 3.  Keppra 500mg  BID as maintenance 4.  Seizure precautions  Case discussed with Dr. Rene Paci, MD [facility] [phone]  02/14/2013  2:47 PM

## 2013-02-14 NOTE — Code Documentation (Signed)
79 yo male was at baseline at 2300 last night.  Patient woke at 0800 and did not speak to his wife, but ate breakfast.  Patient's wife noticed no facial droop at that time.  Patient's wife took a shower and when she got out she noticed the patient had a facial droop and right sided weakness.  Code Stroke activated at 1038 and patient arrived to [facility] by [facility] at 1057.  EDP cleared for CT at 1058, Stroke team arrived at 1048, neurologist arrived at 1127, and patient to CT at 1100.  Patient has a h/o atrial fibrillation in which he took Coumadin until he developed a SDH in January 2014.  CT shows chronic left subdural hemorrhage with superimposed acute or subacute bleed with associated 0.5 cm of left to right midline shift.  NIHSS 5 on arrival, see documentation for details.  Patient has right facial droop and aphasia.  Right sided weakness has resolved.  Patient is in atrial fibrillation on the monitor. No acute stroke treatment at this time.  Handoff completed with ED RN Wes.

## 2013-02-14 NOTE — H&P (Signed)
[facility] History and Physical  [name] ZOX:[id number] DOB: [birth year] DOA: 02/14/2013  Referring physician:  PCP: Georgann Housekeeper, MD  Specialists:   Chief Complaint: chronic left subdural hemorrhage with superimposed acute or subacute bleed   HPI: [name] is a 79 y.o. male PMHx dementia, hypothyroidism, A- fib, chronic subdural hematoma, HTN, male who was normal last night. When he woke up this AM wife states they ate breakfast together and then she went to take a shower approximately 0900-0930. When she returns states patient  was in the bedroom and not following commands. She noted a right facial droop also. Patient has a home health nurse who cares for her son at home who recommended they call EMS. There was no jerking or tonic clonic movement noted but patient was incontinent of urine. EMS was called and patient was brought to hospital as a code stroke. Initial CT head showed chronic left subdural hemorrhage with superimposed acute or subacute bleed. NOTE; A-fib since last bleed 06/23/12. The patient is pleasant, mildly confused he is alert and oriented x1 (self).    Review of Systems: The patient denies anorexia, fever, weight loss,, vision loss, decreased hearing, hoarseness, chest pain, syncope, dyspnea on exertion, peripheral edema, balance deficits, hemoptysis, abdominal pain, melena, hematochezia, severe indigestion/heartburn, hematuria, incontinence, genital sores, muscle weakness, suspicious skin lesions, transient blindness, difficulty walking, depression, unusual weight change, enlarged lymph nodes, angioedema, and breast masses.    Past Medical History  Diagnosis Date  . Dementia   . A-fib   . Enlarged prostate   . Thyroid disease     hypothyroid  . Hypertension    Past Surgical History  Procedure Laterality Date  . Eye surgery     Social History:  reports that he has never smoked. He does not have any smokeless tobacco history on file. He  reports that  drinks alcohol. He reports that he does not use illicit drugs.    Allergies  Allergen Reactions  . Contrast Media [Iodinated Diagnostic Agents]     rash    No family history on file.   Prior to Admission medications   Medication Sig Start Date End Date Taking? Authorizing Provider  Cholecalciferol (VITAMIN D PO) Take 1 tablet by mouth every morning.    Historical Provider, MD  donepezil (ARICEPT) 10 MG tablet Take 10 mg by mouth daily. 07/25/12   Ranelle Oyster, MD  doxazosin (CARDURA) 2 MG tablet Take 2 mg by mouth at bedtime. 07/06/12   Mcarthur Rossetti Angiulli, PA-C  finasteride (PROSCAR) 5 MG tablet Take 5 mg by mouth at bedtime. 07/06/12   Mcarthur Rossetti Angiulli, PA-C  hydrochlorothiazide (HYDRODIURIL) 25 MG tablet Take 12.5 mg by mouth daily. 07/06/12   Mcarthur Rossetti Angiulli, PA-C  levothyroxine (SYNTHROID, LEVOTHROID) 50 MCG tablet Take 50 mcg by mouth daily at 12 noon. 07/06/12   Mcarthur Rossetti Angiulli, PA-C  metoprolol (LOPRESSOR) 50 MG tablet Take 25 mg by mouth 2 (two) times daily. 07/06/12   Mcarthur Rossetti Angiulli, PA-C  naproxen (NAPROSYN) 375 MG tablet Take 1 tablet (375 mg total) by mouth 2 (two) times daily as needed (Pain). 08/07/12   Dione Booze, MD  simvastatin (ZOCOR) 40 MG tablet Take 20 mg by mouth every evening. 07/06/12   Charlton Amor, PA-C   Physical Exam: Filed Vitals:   02/14/13 1119 02/14/13 1122 02/14/13 1147 02/14/13 1148  BP: 156/97 156/97    Pulse: 69 67    Temp:   97.4 F (36.3 C) 97.4  F (36.3 C)  TempSrc:   Oral   Resp: 17 16    SpO2: 96% 98%       General: Alert,NAD  Eyes: Pupils equal react to light and accommodation  Cardiovascular: Irregular irregular rhythm and rate, negative murmurs rubs or gallops, DP/PT pulses 2+ bilateral  Respiratory: Clear to auscultation bilateral  Abdomen: Soft nontender nondistended plus bowel sounds  Neurologic: Alert and oriented x1 (self), cranial nerves II through XII intact, extremity strength 5/5, sensation  intact throughout, some mild right side facial drooping, some mild right eyelid weakness, tongue midline, uvula midline, patient unable to perform quick finger touches, bilateral knee reflexes +1, bilateral Babinski downgoing,  Labs on Admission:  Basic Metabolic Panel:  Recent Labs Lab 02/14/13 1103 02/14/13 1119  NA 142 142  K 4.1 4.1  CL 105 103  CO2 30  --   GLUCOSE 113* 113*  BUN 25* 26*  CREATININE 1.16 1.30  CALCIUM 9.6  --    Liver Function Tests:  Recent Labs Lab 02/14/13 1103  AST 16  ALT 9  ALKPHOS 58  BILITOT 1.3*  PROT 6.3  ALBUMIN 3.2*   No results found for this basename: LIPASE, AMYLASE,  in the last 168 hours No results found for this basename: AMMONIA,  in the last 168 hours CBC:  Recent Labs Lab 02/14/13 1103 02/14/13 1119  WBC 6.2  --   NEUTROABS 3.3  --   HGB 12.1* 11.9*  HCT 36.2* 35.0*  MCV 98.9  --   PLT 125*  --    Cardiac Enzymes:  Recent Labs Lab 02/14/13 1103  TROPONINI <0.30    BNP (last 3 results) No results found for this basename: PROBNP,  in the last 8760 hours CBG:  Recent Labs Lab 02/14/13 1058  GLUCAP 114*    Radiological Exams on Admission: Ct Head (brain) Wo Contrast  02/14/2013   *RADIOLOGY REPORT*  Clinical Data: History of subdural hemorrhage.  CT HEAD WITHOUT CONTRAST  Technique:  Contiguous axial images were obtained from the base of the skull through the vertex without contrast.  Comparison: Head CT scan 10/04/2012.  Findings: The patient has a mixed attenuation extra-axial fluid collection over the left convexities consistent with chronic subdural hemorrhage with superimposed acute or subacute bleed.  The collection measures up to 1.7 cm in diameter compared to 2.3 cm on the prior study.  There is a left right midline shift of 0.5 cm compared to 0.7 cm on the prior exam.  The brain is atrophic with chronic microvascular ischemic change. No evidence of acute infarction, mass lesion, hydrocephalus or  pneumocephalus is seen.  The calvarium is intact.  Imaged paranasal sinuses are clear.  IMPRESSION: Chronic left subdural hemorrhage with superimposed acute or subacute bleed.  Associated 0.5 cm of left to right midline shift is identified.  Critical Value/emergent results were called by telephone at the time of interpretation on 02/14/2013 at 11:22 a.m. to Dr. Ranae Palms, who verbally acknowledged these results.   Original Report Authenticated By: Holley Dexter, M.D.    EKG: Pending  Assessment/Plan Active Problems:   * No active hospital problems. *   1. Acute/chronic SDH; Dr. Cheree Ditto (neurosurgeon) and Dr. Thad Ranger (neurologist) field the slight acute bleed is not responsible for patient's signs and symptoms. Feels new onset seizure most likely the cause. --- Spoke with Dr. Thad Ranger (neurologist) she states she will start Keppra patient --- Per Dr. Donalee Citrin (neurosurgeon) surgery not required for SDH at this time   2.  Seizures; new onset see #1 --- Check TSH level   3. Atrial fib; patient not a candidate for any type of anticoagulation including aspirin secondary to risk of bleeding  4. HTN; patient received a dose of hydralazine 10 mg IV which lowered his SBP to 165, patient has not passed swallowing test so should be able to receive home BP medication. --Will allow patient's BP to run slightly high. Parameters in place  5. Dementia; per wife patient has baseline dementia and currently is not back to his baseline. Currently on no home medication  6. Hypothyroidism; continue home meds, obtain TSH and free T4    Code Status: Wife and sure status will bring in living will in the a.m. Family Communication: Wife and daughter-in-law at the bedside aware of plan of care Disposition Plan: Per neurosurgery and neurology  Time spent: 60 minutes  Drema [place] [facility] Pager [phone]  If 7PM-7AM, please contact night-coverage [email or web address] Password [facility] 02/14/2013,  12:27 PM

## 2013-02-14 NOTE — Evaluation (Signed)
Clinical/Bedside Swallow Evaluation Patient Details  Name: [name] MRN: [id number] Date of Birth: [birth year]  Today's Date: 02/14/2013 Time: 6644-0347 SLP Time Calculation (min): 17 min  Past Medical History:  Past Medical History  Diagnosis Date  . Dementia   . A-fib   . Enlarged prostate   . Thyroid disease     hypothyroid  . Hypertension    Past Surgical History:  Past Surgical History  Procedure Laterality Date  . Eye surgery     HPI:  Patient is a very pleasant 79 year old woman who underwent aburr hole twist drill evacuation of chronic subdural with Dr. Wynetta Emery. Patient recovered fairly well has been followed serially as an outpatient with followup head CT. Followup head CT is as shown persistent chronic subdural fluid collection with minimal mass effect and the patient was tolerating it very well patient presented after being found by his wife not responding well to her with some drooping of the right side of his face she brought the emergency department.  CT obtained showed a small amount of acute blood in his chronic subdural fluid collection I we have been consult. Patient's wife reports no particular inciting event to this episode no head trauma she knows of I. he was complaining of a little bit of left-sided flank pain but nothing prior. MD seems to suspect seizure activity.   Assessment / Plan / Recommendation Clinical Impression  Pt demonstrates adequate swallow function, no evidence of aspiration or impairment. Pt may initiate a regular diet with thin liquids. No SLP f/u for swallowing required. Pt may benefit from SLP cognitive linguistc eval if pts cognition function remains off baseline.     Aspiration Risk  Mild    Diet Recommendation Regular;Thin liquid   Liquid Administration via: Cup;Straw Medication Administration: Whole meds with liquid Supervision: Patient able to self feed Postural Changes and/or Swallow Maneuvers: Seated upright 90 degrees     Other  Recommendations Oral Care Recommendations: Oral care BID   Follow Up Recommendations  None    Frequency and Duration        Pertinent Vitals/Pain NA    SLP Swallow Goals     Swallow Study Prior Functional Status       General HPI: Patient is a very pleasant 79 year old woman who underwent aburr hole twist drill evacuation of chronic subdural with Dr. Wynetta Emery. Patient recovered fairly well has been followed serially as an outpatient with followup head CT. Followup head CT is as shown persistent chronic subdural fluid collection with minimal mass effect and the patient was tolerating it very well patient presented after being found by his wife not responding well to her with some drooping of the right side of his face she brought the emergency department.  CT obtained showed a small amount of acute blood in his chronic subdural fluid collection I we have been consult. Patient's wife reports no particular inciting event to this episode no head trauma she knows of I. he was complaining of a little bit of left-sided flank pain but nothing prior. MD seems to suspect seizure activity. Type of Study: Bedside swallow evaluation Diet Prior to this Study: Regular;Thin liquids Temperature Spikes Noted: No Respiratory Status: Room air History of Recent Intubation: No Behavior/Cognition: Alert;Cooperative;Pleasant mood;Confused Oral Cavity - Dentition: Dentures, top;Dentures, bottom Self-Feeding Abilities: Able to feed self Patient Positioning: Upright in bed Baseline Vocal Quality: Clear Volitional Cough: Cognitively unable to elicit Volitional Swallow: Unable to elicit    Oral/Motor/Sensory Function Overall Oral Motor/Sensory Function: Appears  within functional limits for tasks assessed   Ice Chips     Thin Liquid Thin Liquid: Within functional limits    Nectar Thick Nectar Thick Liquid: Not tested   Honey Thick Honey Thick Liquid: Not tested   Puree Puree: Within functional limits    Solid   GO    Solid: Within functional limits      [facility], [place] CCC-SLP [phone]  Claudine Mouton 02/14/2013,3:03 PM

## 2013-02-14 NOTE — ED Notes (Addendum)
Per [facility] pt was at home with his wife.  Reportedly she states that at 0830 this morning his wife said he got up to eat but was non verbal.  Around 8657 am this am she noticed a Right sided facial droop.  His home health nurse arrived to find him with R arm weakness and bilateral leg flaccidity he had urinary incontinence around 1000.  EMS was contacted at this time.

## 2013-02-14 NOTE — Consult Note (Signed)
Reason for Consult: Subdural hematoma Referring Physician: ER Dr. Donn Pierini [name] is an 79 y.o. male.  HPI: Patient is a very pleasant 79 year old woman well known to me and my service who I did a burr hole twist drill evacuation of chronic subdural many months ago patient recovered fairly well has been followed serially as an outpatient with followup head CT. Followup head CT is as shown persistent chronic subdural fluid collection with minimal mass effect and the patient was tolerating it very well patient presented after being found by his wife not responding well to her with some drooping of the right side of his face she brought the emergency department emergency department to coach coach stroke workup that was negative neurology was seeing him and head CT obtained showed a small amount of acute blood in his chronic subdural fluid collection I we have been consult. Patient's wife reports no particular inciting event to this episode no head trauma she knows of I. he was complaining of a little bit of left-sided flank pain but nothing prior.  Past Medical History  Diagnosis Date  . Dementia   . A-fib   . Enlarged prostate   . Thyroid disease     hypothyroid  . Hypertension     Past Surgical History  Procedure Laterality Date  . Eye surgery      History reviewed. No pertinent family history.  Social History:  reports that he has never smoked. He does not have any smokeless tobacco history on file. He reports that  drinks alcohol. He reports that he does not use illicit drugs.  Allergies:  Allergies  Allergen Reactions  . Contrast Media [Iodinated Diagnostic Agents]     rash    Medications: I have reviewed the patient's current medications.  Results for orders placed during the hospital encounter of 02/14/13 (from the past 48 hour(s))  GLUCOSE, CAPILLARY     Status: Abnormal   Collection Time    02/14/13 10:58 AM      Result Value Range   Glucose-Capillary 114  (*) 70 - 99 mg/dL   Comment 1 Notify RN     Comment 2 Documented in Chart     Comment 3 [id number]    PROTIME-INR     Status: None   Collection Time    02/14/13 11:03 AM      Result Value Range   Prothrombin Time 14.5  11.6 - 15.2 seconds   INR 1.15  0.00 - 1.49  APTT     Status: None   Collection Time    02/14/13 11:03 AM      Result Value Range   aPTT 28  24 - 37 seconds  CBC     Status: Abnormal   Collection Time    02/14/13 11:03 AM      Result Value Range   WBC 6.2  4.0 - 10.5 K/uL   RBC 3.66 (*) 4.22 - 5.81 MIL/uL   Hemoglobin 12.1 (*) 13.0 - 17.0 g/dL   HCT 40.9 (*) 81.1 - 91.4 %   MCV 98.9  78.0 - 100.0 fL   MCH 33.1  26.0 - 34.0 pg   MCHC 33.4  30.0 - 36.0 g/dL   RDW 78.2  95.6 - 21.3 %   Platelets 125 (*) 150 - 400 K/uL  DIFFERENTIAL     Status: None   Collection Time    02/14/13 11:03 AM      Result Value Range   Neutrophils Relative %  54  43 - 77 %   Neutro Abs 3.3  1.7 - 7.7 K/uL   Lymphocytes Relative 36  12 - 46 %   Lymphs Abs 2.3  0.7 - 4.0 K/uL   Monocytes Relative 8  3 - 12 %   Monocytes Absolute 0.5  0.1 - 1.0 K/uL   Eosinophils Relative 2  0 - 5 %   Eosinophils Absolute 0.1  0.0 - 0.7 K/uL   Basophils Relative 0  0 - 1 %   Basophils Absolute 0.0  0.0 - 0.1 K/uL  COMPREHENSIVE METABOLIC PANEL     Status: Abnormal   Collection Time    02/14/13 11:03 AM      Result Value Range   Sodium 142  135 - 145 mEq/L   Potassium 4.1  3.5 - 5.1 mEq/L   Chloride 105  96 - 112 mEq/L   CO2 30  19 - 32 mEq/L   Glucose, Bld 113 (*) 70 - 99 mg/dL   BUN 25 (*) 6 - 23 mg/dL   Creatinine, Ser 9.60  0.50 - 1.35 mg/dL   Calcium 9.6  8.4 - 45.4 mg/dL   Total Protein 6.3  6.0 - 8.3 g/dL   Albumin 3.2 (*) 3.5 - 5.2 g/dL   AST 16  0 - 37 U/L   ALT 9  0 - 53 U/L   Alkaline Phosphatase 58  39 - 117 U/L   Total Bilirubin 1.3 (*) 0.3 - 1.2 mg/dL   GFR calc non Af Amer 54 (*) >90 mL/min   GFR calc Af Amer 62 (*) >90 mL/min   Comment: (NOTE)     The eGFR has been  calculated using the CKD EPI equation.     This calculation has not been validated in all clinical situations.     eGFR's persistently <90 mL/min signify possible Chronic Kidney     Disease.  TROPONIN I     Status: None   Collection Time    02/14/13 11:03 AM      Result Value Range   Troponin I <0.30  <0.30 ng/mL   Comment:            Due to the release kinetics of cTnI,     a negative result within the first hours     of the onset of symptoms does not rule out     myocardial infarction with certainty.     If myocardial infarction is still suspected,     repeat the test at appropriate intervals.  POCT I-STAT TROPONIN I     Status: None   Collection Time    02/14/13 11:17 AM      Result Value Range   Troponin i, poc 0.01  0.00 - 0.08 ng/mL   Comment 3            Comment: Due to the release kinetics of cTnI,     a negative result within the first hours     of the onset of symptoms does not rule out     myocardial infarction with certainty.     If myocardial infarction is still suspected,     repeat the test at appropriate intervals.  POCT I-STAT, CHEM 8     Status: Abnormal   Collection Time    02/14/13 11:19 AM      Result Value Range   Sodium 142  135 - 145 mEq/L   Potassium 4.1  3.5 - 5.1 mEq/L   Chloride 103  96 - 112 mEq/L   BUN 26 (*) 6 - 23 mg/dL   Creatinine, Ser 8.11  0.50 - 1.35 mg/dL   Glucose, Bld 914 (*) 70 - 99 mg/dL   Calcium, Ion 7.82  9.56 - 1.30 mmol/L   TCO2 30  0 - 100 mmol/L   Hemoglobin 11.9 (*) 13.0 - 17.0 g/dL   HCT 21.3 (*) 08.6 - 57.8 %    Ct Head (brain) Wo Contrast  02/14/2013   *RADIOLOGY REPORT*  Clinical Data: History of subdural hemorrhage.  CT HEAD WITHOUT CONTRAST  Technique:  Contiguous axial images were obtained from the base of the skull through the vertex without contrast.  Comparison: Head CT scan 10/04/2012.  Findings: The patient has a mixed attenuation extra-axial fluid collection over the left convexities consistent with chronic  subdural hemorrhage with superimposed acute or subacute bleed.  The collection measures up to 1.7 cm in diameter compared to 2.3 cm on the prior study.  There is a left right midline shift of 0.5 cm compared to 0.7 cm on the prior exam.  The brain is atrophic with chronic microvascular ischemic change. No evidence of acute infarction, mass lesion, hydrocephalus or pneumocephalus is seen.  The calvarium is intact.  Imaged paranasal sinuses are clear.  IMPRESSION: Chronic left subdural hemorrhage with superimposed acute or subacute bleed.  Associated 0.5 cm of left to right midline shift is identified.  Critical Value/emergent results were called by telephone at the time of interpretation on 02/14/2013 at 11:22 a.m. to Dr. Ranae Palms, who verbally acknowledged these results.   Original Report Authenticated By: Holley Dexter, M.D.    Review of Systems  Constitutional: Negative.   HENT: Negative.   Eyes: Negative.   Gastrointestinal: Negative.   Genitourinary: Negative.   Musculoskeletal: Negative.   Skin: Negative.   Neurological: Positive for dizziness and focal weakness.  Endo/Heme/Allergies: Negative.   Psychiatric/Behavioral: Negative.    Blood pressure 183/96, pulse 57, temperature 97.4 F (36.3 C), temperature source Oral, resp. rate 17, SpO2 100.00%. Physical Exam  Constitutional: He is oriented to person, place, and time. He appears well-developed and well-nourished.  HENT:  Head: Normocephalic and atraumatic.  Eyes: Pupils are equal, round, and reactive to light.  Neck: Normal range of motion.  Cardiovascular: Normal rate.   Respiratory: Effort normal.  GI: Soft.  Musculoskeletal: Normal range of motion.  Neurological: He is alert and oriented to person, place, and time. He has normal strength. GCS eye subscore is 4. GCS verbal subscore is 5. GCS motor subscore is 6.  Awake alert appears to have expressive dysphasia he is able to follow commands his pupils are equal and reactive  he has a right central seventh nerve palsy manifested by facial droop and inability to close his right eyelid completely. Strength appears to be 5 out of 5 with no pronator drift in all extremities    Assessment/Plan: 79 year old gentleman presents with a small amount of acute on chronic subdural hematoma this overall the subdural is stable in size from his last head CT there is actually less shift on his current head CT there was previously. In light of the patient's age and mental status I believe that his facial droop and is expressive dysphasia more likely represent seizure activity. I do not think that any surgery is indicated for this. He has a long-standing chronic subdural with subdural membranes that his age would not be beneficial to him to undergo a full craniotomy stripping of the membranes that would still  result in a potential space to be filled up with either blood or spinal fluid. I have extensively explained this to layperson terms to the wife the son and the patient they understand and they agreed to continue conservative management and nonsurgical. Patient be admitted to medicine service are placed on antiepileptics and observed I will follow the patient with neurology and medicine while in house.  Lysandra Loughmiller P 02/14/2013, 12:56 PM

## 2013-02-14 NOTE — Progress Notes (Signed)
Patient become nauseous and vomited. MD notified. [place], [place] M

## 2013-02-14 NOTE — ED Provider Notes (Signed)
CSN: [id number]     Arrival date & time 02/14/13  1057 History   First MD Initiated Contact with Patient 02/14/13 1102     Chief Complaint  Patient presents with  . Code Stroke   (Consider location/radiation/quality/duration/timing/severity/associated sxs/prior Treatment) HPI Shows a 79 year old male was brought in as a code stroke. he was last seen normal at 8:00 this morning. He was discovered to have a right facial droop right arm weakness and bilateral leg weakness by the home health nurse. Patient had a subdural hematoma earlier this year that required draining. He is followed by Dr. Wynetta Emery. Wife denies patient taking any anticoagulants. Denies recent trauma. Level 5 caveat applies due to patient's receptive aphasia. Past Medical History  Diagnosis Date  . Dementia   . A-fib   . Enlarged prostate   . Thyroid disease     hypothyroid  . Hypertension    Past Surgical History  Procedure Laterality Date  . Eye surgery     No family history on file. History  Substance Use Topics  . Smoking status: Never Smoker   . Smokeless tobacco: Not on file  . Alcohol Use: Yes     Comment: occ    Review of Systems  Unable to perform ROS   Allergies  Contrast media  Home Medications   Current Outpatient Rx  Name  Route  Sig  Dispense  Refill  . Cholecalciferol (VITAMIN D PO)   Oral   Take 1 tablet by mouth every morning.         . donepezil (ARICEPT) 10 MG tablet   Oral   Take 10 mg by mouth daily.         Marland Kitchen doxazosin (CARDURA) 2 MG tablet   Oral   Take 2 mg by mouth at bedtime.         . finasteride (PROSCAR) 5 MG tablet   Oral   Take 5 mg by mouth at bedtime.         . hydrochlorothiazide (HYDRODIURIL) 25 MG tablet   Oral   Take 12.5 mg by mouth daily.         Marland Kitchen levothyroxine (SYNTHROID, LEVOTHROID) 50 MCG tablet   Oral   Take 50 mcg by mouth daily at 12 noon.         . metoprolol (LOPRESSOR) 50 MG tablet   Oral   Take 25 mg by mouth 2 (two) times  daily.         . naproxen (NAPROSYN) 375 MG tablet   Oral   Take 1 tablet (375 mg total) by mouth 2 (two) times daily as needed (Pain).   20 tablet   0   . simvastatin (ZOCOR) 40 MG tablet   Oral   Take 20 mg by mouth every evening.          BP 156/97  Pulse 67  Resp 16  SpO2 98% Physical Exam  Nursing note and vitals reviewed. Constitutional: He appears well-developed and well-nourished. No distress.  HENT:  Head: Normocephalic and atraumatic.  Mouth/Throat: Oropharynx is clear and moist.  Eyes: EOM are normal. Pupils are equal, round, and reactive to light.  Neck: Normal range of motion. Neck supple.  Cardiovascular: Normal rate and regular rhythm.   Pulmonary/Chest: Effort normal and breath sounds normal. No respiratory distress. He has no wheezes. He has no rales. He exhibits no tenderness.  Abdominal: Soft. Bowel sounds are normal. He exhibits no distension and no mass. There is no tenderness. There  is no rebound and no guarding.  Musculoskeletal: Normal range of motion. He exhibits no edema and no tenderness.  Neurological: He is alert.  Patient intermittently following commands. Appears to have difficulty understanding. Concern for receptive aphasia. Moves all extremities without any deficit. Sensation is grossly intact.  Skin: Skin is warm and dry. No rash noted. No erythema.  Psychiatric: He has a normal mood and affect. His behavior is normal.    ED Course  Procedures (including critical care time) Labs Review Labs Reviewed  GLUCOSE, CAPILLARY - Abnormal; Notable for the following:    Glucose-Capillary 114 (*)    All other components within normal limits  POCT I-STAT, CHEM 8 - Abnormal; Notable for the following:    BUN 26 (*)    Glucose, Bld 113 (*)    Hemoglobin 11.9 (*)    HCT 35.0 (*)    All other components within normal limits  PROTIME-INR  APTT  CBC  DIFFERENTIAL  COMPREHENSIVE METABOLIC PANEL  TROPONIN I   Imaging Review No results  found.  Date: 02/14/2013  Rate: 71   Rhythm: atrial fibrillation  QRS Axis: normal  Intervals: normal  ST/T Wave abnormalities: nonspecific T wave changes  Conduction Disutrbances:none  Narrative Interpretation:   Old EKG Reviewed: When compared with prior EKGs no significant changes are seen   MDM  Stroke unit at bed side.  Discussed with Dr. Wynetta Emery. Patient with improved intracranial shift from previous CT scan. Scan demonstrates acute or subacute blood in from chronic subdural. Dr. Wynetta Emery thinks that the patient's symptoms may be related to seizures. Suggests starting on antiepileptic at neurology's discretion. Discussed with Dr. Thad Ranger at neurology. She is seeing the patient. She suggests admitting the patient to triad for observation and seizure control.   Loren Racer, MD 02/15/13 [phone]

## 2013-02-14 NOTE — Progress Notes (Signed)
Patient hypertensive with blood pressure of 196/101. MD notified and new orders given. Will continue to monitor patient closely. [place], [place] M

## 2013-02-14 NOTE — Progress Notes (Signed)
Dr Joseph Art called regarding pts increased agitation. Order received for 1 time dose of Risperidone. Will continue to monitor pt.  Cyndie Chime [place]

## 2013-02-15 LAB — COMPREHENSIVE METABOLIC PANEL
ALT: 8 U/L (ref 0–53)
Albumin: 2.9 g/dL — ABNORMAL LOW (ref 3.5–5.2)
Alkaline Phosphatase: 53 U/L (ref 39–117)
Chloride: 104 mEq/L (ref 96–112)
GFR calc Af Amer: 52 mL/min — ABNORMAL LOW (ref >90)
Glucose, Bld: 123 mg/dL — ABNORMAL HIGH (ref 70–99)
Potassium: 3.7 mEq/L (ref 3.5–5.1)
Sodium: 143 mEq/L (ref 135–145)
Total Protein: 5.8 g/dL — ABNORMAL LOW (ref 6.0–8.3)

## 2013-02-15 LAB — TSH: TSH: 3.527 u[IU]/mL (ref 0.350–4.500)

## 2013-02-15 LAB — T4, FREE: Free T4: 1.3 ng/dL (ref 0.80–1.80)

## 2013-02-15 MED ORDER — SODIUM CHLORIDE 0.9 % IV SOLN
500.0000 mg | Freq: Two times a day (BID) | INTRAVENOUS | Status: DC
  Administered 2013-02-16 – 2013-02-18 (×6): 500 mg via INTRAVENOUS
  Filled 2013-02-15 (×8): qty 5

## 2013-02-15 MED ORDER — DONEPEZIL HCL 5 MG PO TABS
5.0000 mg | ORAL_TABLET | Freq: Every day | ORAL | Status: DC
  Administered 2013-02-15 – 2013-02-19 (×5): 5 mg via ORAL
  Filled 2013-02-15 (×7): qty 1

## 2013-02-15 MED ORDER — LEVETIRACETAM 500 MG/5ML IV SOLN
500.0000 mg | Freq: Two times a day (BID) | INTRAVENOUS | Status: DC
  Filled 2013-02-15 (×2): qty 5

## 2013-02-15 NOTE — Progress Notes (Signed)
Speech Language Pathology Dysphagia Treatment Patient Details Name: [name] MRN: [id number] DOB: 12-12-1919 Today's Date: 02/15/2013 Time: 7829-5621 SLP Time Calculation (min): 8 min  Assessment / Plan / Recommendation Clinical Impression  SLP initially signed off on pt as he demonstrated swallow WNL during yesterdays eval. MD reordered SLP eval due to waxing and waning alertness. SLP rechecked pt, will charge treatment as pt just seen yesterday. Again, despite lethargy during session pt completely functional. No evidence of struggle with swallow. Pt may continue current diet. RN reports he has tolerated it well, wakes up well and eats. No skilled SLP intervention needed for this pt. WIll again sign off. If coughin/choking occurs with PO, reeval will be warranted.     Diet Recommendation  Continue with Current Diet: Regular;Thin liquid    SLP Plan All goals met   Pertinent Vitals/Pain NA   Swallowing Goals     General Temperature Spikes Noted: No Respiratory Status: Supplemental O2 delivered via (comment) Behavior/Cognition: Lethargic Oral Cavity - Dentition: Dentures, top;Dentures, bottom Patient Positioning: Upright in bed  Oral Cavity - Oral Hygiene Does patient have any of the following "at risk" factors?: None of the above   Dysphagia Treatment Treatment focused on: Skilled observation of diet tolerance Treatment Methods/Modalities: Skilled observation Patient observed directly with PO's: Yes Type of PO's observed: Regular;Thin liquids;Dysphagia 1 (puree) Feeding: Needs assist Liquids provided via: Cup;Straw Type of cueing: Verbal;Tactile Amount of cueing: Minimal   GO    Harlon Ditty, MA CCC-SLP [phone]  Claudine Mouton 02/15/2013, 12:34 PM

## 2013-02-15 NOTE — Progress Notes (Signed)
UR completed 

## 2013-02-15 NOTE — Evaluation (Signed)
Occupational Therapy Evaluation Patient Details Name: [name] MRN: [id number] DOB: 07/13/1919 Today's Date: 02/15/2013 Time: 4098-1191 OT Time Calculation (min): 26 min  OT Assessment / Plan / Recommendation History of present illness [name] is a 79 y.o. male PMHx dementia, hypothyroidism, A- fib, chronic subdural hematoma, HTN, male who was normal last night. When he woke up this AM wife states they ate breakfast together and then she went to take a shower approximately 0900-0930. When she returns states patient  was in the bedroom and not following commands. She noted a right facial droop also. Patient has a home health nurse who cares for her son at home who recommended they call EMS. There was no jerking or tonic clonic movement noted but patient was incontinent of urine. EMS was called and patient was brought to hospital as a code stroke. Initial CT head showed chronic left subdural hemorrhage with superimposed acute or subacute bleed. It is belived that the pt had a seizure   Clinical Impression   Pt admitted with above. Pt currently with functional limitations due to the deficits listed below (see OT Problem List); pt slow to respond, minimal verbalizations, and confused with all the lines/tubes. Pt will benefit from skilled OT to increase their safety and independence with ADL and functional mobility for ADL to facilitate discharge to venue listed below.       OT Assessment  Patient needs continued OT Services    Follow Up Recommendations  SNF       Equipment Recommendations  None recommended by OT       Frequency  Min 2X/week    Precautions / Restrictions Precautions Precautions: Fall Restrictions Weight Bearing Restrictions: No       ADL  Eating/Feeding: Performed;+1 Total assistance (Does better with finger foods, if placed in his hand) Where Assessed - Eating/Feeding: Edge of bed Toilet Transfer: Moderate assistance Toilet Transfer Method: Sit  to Barista:  (from bed) Transfers/Ambulation Related to ADLs: Mod A sit <>stand    OT Diagnosis: Generalized weakness;Cognitive deficits  OT Problem List: Decreased strength;Decreased activity tolerance;Impaired balance (sitting and/or standing);Decreased cognition;Decreased knowledge of use of DME or AE OT Treatment Interventions: Self-care/ADL training;Balance training;Cognitive remediation/compensation;DME and/or AE instruction;Patient/family education   OT Goals(Current goals can be found in the care plan section) Acute Rehab OT Goals Patient Stated Goal: unable to state OT Goal Formulation: Patient unable to participate in goal setting Time For Goal Achievement: 03/01/13 Potential to Achieve Goals: Fair  Visit Information  Last OT Received On: 02/15/13 Assistance Needed: +2 History of Present Illness: [name] is a 79 y.o. male PMHx dementia, hypothyroidism, A- fib, chronic subdural hematoma, HTN, male who was normal last night. When he woke up this AM wife states they ate breakfast together and then she went to take a shower approximately 0900-0930. When she returns states patient  was in the bedroom and not following commands. She noted a right facial droop also. Patient has a home health nurse who cares for her son at home who recommended they call EMS. There was no jerking or tonic clonic movement noted but patient was incontinent of urine. EMS was called and patient was brought to hospital as a code stroke. Initial CT head showed chronic left subdural hemorrhage with superimposed acute or subacute bleed. 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 Bruce in the [place] Communication Communication: No difficulties Dominant Hand: Right         Vision/Perception Vision - History Baseline Vision: Wears glasses only for reading   Cognition  Cognition Arousal/Alertness: Awake/alert Behavior During Therapy: Flat affect Overall Cognitive Status: Impaired/Different from baseline Area of Impairment: Orientation;Attention;Safety/judgement Orientation Level: Disoriented to;Place;Time;Situation Current Attention Level: Sustained Following Commands: Follows one step commands inconsistently;Follows one step commands with increased time Safety/Judgement: Decreased awareness of safety Problem Solving: Slow processing;Difficulty sequencing;Requires verbal cues;Requires tactile cues General Comments: Pulling at lines/leads/tubes ([place])    Extremity/Trunk Assessment Upper Extremity Assessment Upper Extremity Assessment: Overall WFL for tasks assessed Lower Extremity Assessment Lower Extremity Assessment: Generalized weakness     Mobility Bed Mobility Bed Mobility: Supine to Sit;Sitting - Scoot to [place] of Bed;Sit to Supine Rolling Right: 3: Mod assist Right Sidelying to Sit: 3: Mod assist Supine to Sit: 3: Mod assist;HOB elevated Sitting - Scoot to Edge of Bed: 3: Mod assist Sit to Supine: 3: Mod assist;HOB flat Details for Bed Mobility Assistance: Pt needed assist to initiate movement.  Pt very lethargic.  Trying to arouse pt by assisting him to EOB.  Pt able to sit up however would not wake up enough to participate.   Transfers Transfers: Sit to Stand;Stand to Sit Sit to Stand: 3: Mod assist;With upper extremity  assist;From bed Stand to Sit: 3: Mod assist;With upper extremity assist;To bed Details for Transfer Assistance: VCs for safe hand placement        Balance Balance Balance Assessed: Yes Static Sitting Balance Static Sitting - Balance Support: No upper extremity supported;Feet supported Static Sitting - Level of Assistance:  (S--while he was working on eating his lunch) Static Sitting - Comment/# of Minutes: 10 minutes   End of Session OT - End of Session Activity Tolerance: Patient limited by fatigue Patient left: in bed;with bed alarm set;with family/visitor present Nurse Communication: Mobility status       Reina Fuse [id number] 02/15/2013, 4:47 PM

## 2013-02-15 NOTE — Progress Notes (Signed)
I reviewed office notes He is on Lasix 20 mg po at home; not on HCTZ If BP allows- will restart lasix  20 mg po daily.  He also take klonapin 0.5 mg at bedtime as needed. Georgann Housekeeper

## 2013-02-15 NOTE — Progress Notes (Signed)
Subjective: Patient intermittent lethargic.  This seems to be his baseline.  No other changes noted.  No further seizure activity noted.    Objective: Current vital signs: BP 122/71  Pulse 58  Temp(Src) 98 F (36.7 C) (Oral)  Resp 18  Ht 5\' 8"  (1.727 m)  Wt 74 kg (163 lb 2.3 oz)  BMI 24.81 kg/m2  SpO2 99% Vital signs in last 24 hours: Temp:  [97.9 F (36.6 C)-98.7 F (37.1 C)] 98 F (36.7 C) (08/29 1159) Pulse Rate:  [54-110] 58 (08/29 1300) Resp:  [15-36] 18 (08/29 1300) BP: (100-196)/(49-110) 122/71 mmHg (08/29 1300) SpO2:  [91 %-100 %] 99 % (08/29 1300) Weight:  [74 kg (163 lb 2.3 oz)] 74 kg (163 lb 2.3 oz) (08/29 0500)  Intake/Output from previous day: 08/28 0701 - 08/29 0700 In: 990.8 [I.V.:990.8] Out: 101 [Urine:100; Emesis/NG output:1] Intake/Output this shift: Total I/O In: 250 [I.V.:250] Out: -  Nutritional status: General  Neurologic Exam: Mental Status:  Lethargic and difficult to arouse.  Localizes to pain.  Does not follow commands. Cranial Nerves:  II: Discs flat bilaterally; Visual fields grossly normal, pupils equal, round, reactive to light and accommodation  III,IV, VI: ptosis not present, extra-ocular motions intact bilaterally  V,VII: corneals intact bilaterally VIII: hearing normal bilaterally  IX,X: gag reflex present  XI: bilateral shoulder shrug  XII: midline tongue extension  Motor:  Moves all extremities weakly Sensory: Responds to noxious stimuli throughout  Plantars:  Right: downgoing    Left: downgoing   Lab Results: Basic Metabolic Panel:  Recent Labs Lab 02/14/13 1103 02/14/13 1119 02/15/13 0420  NA 142 142 143  K 4.1 4.1 3.7  CL 105 103 104  CO2 30  --  29  GLUCOSE 113* 113* 123*  BUN 25* 26* 22  CREATININE 1.16 1.30 1.34  CALCIUM 9.6  --  9.1  MG  --   --  1.9    Liver Function Tests:  Recent Labs Lab 02/14/13 1103 02/15/13 0420  AST 16 17  ALT 9 8  ALKPHOS 58 53  BILITOT 1.3* 1.5*  PROT 6.3 5.8*   ALBUMIN 3.2* 2.9*   No results found for this basename: LIPASE, AMYLASE,  in the last 168 hours No results found for this basename: AMMONIA,  in the last 168 hours  CBC:  Recent Labs Lab 02/14/13 1103 02/14/13 1119  WBC 6.2  --   NEUTROABS 3.3  --   HGB 12.1* 11.9*  HCT 36.2* 35.0*  MCV 98.9  --   PLT 125*  --     Cardiac Enzymes:  Recent Labs Lab 02/14/13 1103  TROPONINI <0.30    Lipid Panel: No results found for this basename: CHOL, TRIG, HDL, CHOLHDL, VLDL, LDLCALC,  in the last 168 hours  CBG:  Recent Labs Lab 02/14/13 1058 02/15/13 0822  GLUCAP 114* 100*    Microbiology: Results for orders placed during the hospital encounter of 02/14/13  MRSA PCR SCREENING     Status: None   Collection Time    02/14/13  1:18 PM      Result Value Range Status   MRSA by PCR NEGATIVE  NEGATIVE Final   Comment:            The GeneXpert MRSA Assay (FDA     approved for NASAL specimens     only), is one component of a     comprehensive MRSA colonization     surveillance program. It is not     intended  to diagnose MRSA     infection nor to guide or     monitor treatment for     MRSA infections.    Coagulation Studies:  Recent Labs  02/14/13 1103  LABPROT 14.5  INR 1.15    Imaging: Ct Head (brain) Wo Contrast  02/14/2013   *RADIOLOGY REPORT*  Clinical Data: History of subdural hemorrhage.  CT HEAD WITHOUT CONTRAST  Technique:  Contiguous axial images were obtained from the base of the skull through the vertex without contrast.  Comparison: Head CT scan 10/04/2012.  Findings: The patient has a mixed attenuation extra-axial fluid collection over the left convexities consistent with chronic subdural hemorrhage with superimposed acute or subacute bleed.  The collection measures up to 1.7 cm in diameter compared to 2.3 cm on the prior study.  There is a left right midline shift of 0.5 cm compared to 0.7 cm on the prior exam.  The brain is atrophic with chronic  microvascular ischemic change. No evidence of acute infarction, mass lesion, hydrocephalus or pneumocephalus is seen.  The calvarium is intact.  Imaged paranasal sinuses are clear.  IMPRESSION: Chronic left subdural hemorrhage with superimposed acute or subacute bleed.  Associated 0.5 cm of left to right midline shift is identified.  Critical Value/emergent results were called by telephone at the time of interpretation on 02/14/2013 at 11:22 a.m. to Dr. Ranae Palms, who verbally acknowledged these results.   Original Report Authenticated By: Holley Dexter, M.D.    Medications:  I have reviewed the patient's current medications. Scheduled: . donepezil  5 mg Oral QHS  . finasteride  5 mg Oral QHS  . levETIRAcetam  500 mg Oral BID  . levothyroxine  25 mcg Oral Q1200  . metoprolol  12.5 mg Oral BID  . risperiDONE  1 mg Oral Daily  . simvastatin  20 mg Oral QPM  . tamsulosin  0.4 mg Oral Daily    Assessment/Plan: Presentation felt to likely represent seizure.  Patient on Keppra and tolerating well without note of further seizure activity.    Recommendations: 1.  Would continue Keppra at current dose but change to IV due to lethargy 2.  Continue seizure precautions 3.  Follow up imaging scheduled for tomorrow   LOS: 1 day   Thana Farr, MD [facility] [phone] 02/15/2013  1:53 PM

## 2013-02-15 NOTE — Consult Note (Signed)
Admit date: 02/14/2013 Referring Physician  Dr. Donette Larry Primary Physician Georgann Housekeeper, MD Primary Cardiologist  [facility] Reason for Consultation  Bradycardia  HPI: 79 year old male with atrial fibrillation admitted with seizure, small subacute subdural hematoma. Overnight on telemetry, heart rates noted to be in the 40s at times. Asymptomatic. During the day, periodically heart rate would decrease into the 50s/upper 40s. Asymptomatic. At one point last evening, heart rate as high as 130 with rapid ventricular response.  He is quite lethargic currently. Working with physical therapy/occupational therapy. He appears more confused than normal. His wife is present in room.  He does not state that he is having any chest pain, shortness of breath. His wife states that he has not had any syncope at home.    PMH:   Past Medical History  Diagnosis Date  . Dementia   . A-fib   . Enlarged prostate   . Thyroid disease     hypothyroid  . Hypertension     PSH:   Past Surgical History  Procedure Laterality Date  . Eye surgery     Allergies:  Contrast media Prior to Admit Meds:   Prescriptions prior to admission  Medication Sig Dispense Refill  . Cholecalciferol (VITAMIN D PO) Take 1 tablet by mouth daily at 12 noon.      . finasteride (PROSCAR) 5 MG tablet Take 5 mg by mouth at bedtime.      . furosemide (LASIX) 20 MG tablet Take 20 mg by mouth every morning.      Marland Kitchen levothyroxine (SYNTHROID, LEVOTHROID) 25 MCG tablet Take 25 mcg by mouth daily at 12 noon.      . metoprolol (LOPRESSOR) 50 MG tablet Take 25 mg by mouth 2 (two) times daily.      . simvastatin (ZOCOR) 40 MG tablet Take 20 mg by mouth every evening.      . tamsulosin (FLOMAX) 0.4 MG CAPS capsule Take 0.4 mg by mouth at bedtime.      . [DISCONTINUED] Cholecalciferol (VITAMIN D PO) Take 1 tablet by mouth every morning.       Fam HX:   History reviewed. No pertinent family history. Social HX:    History   Social History  .  Marital Status: Married    Spouse Name: N/A    Number of Children: N/A  . Years of Education: N/A   Occupational History  . Not on file.   Social History Main Topics  . Smoking status: Never Smoker   . Smokeless tobacco: Not on file  . Alcohol Use: Yes     Comment: occ  . Drug Use: No  . Sexual Activity:    Other Topics Concern  . Not on file   Social History Narrative  . No narrative on file     ROS: Denies syncope, chest pain, shortness of breath, orthopnea All 11 ROS were addressed and are negative except what is stated in the HPI  Physical Exam: Blood pressure 139/95, pulse 58, temperature 98.2 F (36.8 C), temperature source Oral, resp. rate 18, height 5\' 8"  (1.727 m), weight 83.3 kg (183 lb 10.3 oz), SpO2 99.00%.    General: Well developed, well nourished, elderly in no acute distress Head: Eyes PERRLA, No xanthomas.   Normal cephalic and atramatic  Lungs:   Clear bilaterally to auscultation and percussion. Normal respiratory effort. No wheezes, no rales. Heart:  Irregularly irregular, no murmurs Pulses are 2+ & equal.            No carotid  bruit. No JVD.  No abdominal bruits.  Abdomen: Bowel sounds are positive, abdomen soft and non-tender without masses. No hepatosplenomegaly. Msk:  Back normal. Normal strength and tone for age. Extremities:   No clubbing, cyanosis or edema.  DP +1 Neuro: Sitting up in bed, alert but appears more debilitated/confused than I remember him. GU: Deferred Rectal: Deferred Psych:   responds appropriately    Labs:   Lab Results  Component Value Date   WBC 6.2 02/14/2013   HGB 11.9* 02/14/2013   HCT 35.0* 02/14/2013   MCV 98.9 02/14/2013   PLT 125* 02/14/2013    Recent Labs Lab 02/15/13 0420  NA 143  K 3.7  CL 104  CO2 29  BUN 22  CREATININE 1.34  CALCIUM 9.1  PROT 5.8*  BILITOT 1.5*  ALKPHOS 53  ALT 8  AST 17  GLUCOSE 123*   No results found for this basename: PTT   Lab Results  Component Value Date   INR 1.15  02/14/2013   INR 1.37 06/26/2012   INR 1.28 06/25/2012   Lab Results  Component Value Date   TROPONINI <0.30 02/14/2013      Radiology:  Ct Head (brain) Wo Contrast  02/14/2013   *RADIOLOGY REPORT*  Clinical Data: History of subdural hemorrhage.  CT HEAD WITHOUT CONTRAST  Technique:  Contiguous axial images were obtained from the base of the skull through the vertex without contrast.  Comparison: Head CT scan 10/04/2012.  Findings: The patient has a mixed attenuation extra-axial fluid collection over the left convexities consistent with chronic subdural hemorrhage with superimposed acute or subacute bleed.  The collection measures up to 1.7 cm in diameter compared to 2.3 cm on the prior study.  There is a left right midline shift of 0.5 cm compared to 0.7 cm on the prior exam.  The brain is atrophic with chronic microvascular ischemic change. No evidence of acute infarction, mass lesion, hydrocephalus or pneumocephalus is seen.  The calvarium is intact.  Imaged paranasal sinuses are clear.  IMPRESSION: Chronic left subdural hemorrhage with superimposed acute or subacute bleed.  Associated 0.5 cm of left to right midline shift is identified.  Critical Value/emergent results were called by telephone at the time of interpretation on 02/14/2013 at 11:22 a.m. to Dr. Ranae Palms, who verbally acknowledged these results.   Original Report Authenticated By: Holley Dexter, M.D.   Personally viewed.  EKG: Atrial fibrillation, heart rate 71  Personally viewed.   Telemetry, reviewed as above. See history of present illness.  ASSESSMENT/PLAN:   79 year old male with persistent atrial fibrillation, occasional tachycardia/bradycardia with subdural hematoma.  1. Bradycardia  - Now, heart rate appears stable.  - With his recent rapid ventricular response, I will continue the metoprolol as currently prescribed 12.5 mg twice a day, lower dose than home dose.  - His bradycardia was asymptomatic, during sleeping  mostly.  - If bradycardia persists, we may need to discontinue his metoprolol.  - Ultimately, if we are unable to control tachycardia because of secondary bradycardia when utilizing beta blocker, pacemaker may need to be entertained as an option. At this point, I would try very hard to avoid any intervention such as this.   2. Atrial fibrillation   - Obviously avoiding aspirin/anticoagulation.   - Continue with adequate rate control.   3. Subdural hematoma   - Primary team.   We will continue to follow. Continue with telemetry for now.    Donato Schultz, MD  02/15/2013  4:40 PM

## 2013-02-15 NOTE — Progress Notes (Signed)
Subjective: Patient reports No complaints this morning a little more confused  Objective: Vital signs in last 24 hours: Temp:  [97.4 F (36.3 C)-98.7 F (37.1 C)] 98 F (36.7 C) (08/29 0823) Pulse Rate:  [55-110] 64 (08/29 0700) Resp:  [15-36] 16 (08/29 0700) BP: (100-196)/(49-110) 100/58 mmHg (08/29 0700) SpO2:  [91 %-100 %] 98 % (08/29 0700) Weight:  [72.2 kg (159 lb 2.8 oz)-74 kg (163 lb 2.3 oz)] 74 kg (163 lb 2.3 oz) (08/29 0500)  Intake/Output from previous day: 08/28 0701 - 08/29 0700 In: 990.8 [I.V.:990.8] Out: 101 [Urine:100; Emesis/NG output:1] Intake/Output this shift:    expressively dysphasic the moves all extremities well with no pronator drift facial droop improved  Lab Results:  Recent Labs  02/14/13 1103 02/14/13 1119  WBC 6.2  --   HGB 12.1* 11.9*  HCT 36.2* 35.0*  PLT 125*  --    BMET  Recent Labs  02/14/13 1103 02/14/13 1119 02/15/13 0420  NA 142 142 143  K 4.1 4.1 3.7  CL 105 103 104  CO2 30  --  29  GLUCOSE 113* 113* 123*  BUN 25* 26* 22  CREATININE 1.16 1.30 1.34  CALCIUM 9.6  --  9.1    Studies/Results: Ct Head (brain) Wo Contrast  02/14/2013   *RADIOLOGY REPORT*  Clinical Data: History of subdural hemorrhage.  CT HEAD WITHOUT CONTRAST  Technique:  Contiguous axial images were obtained from the base of the skull through the vertex without contrast.  Comparison: Head CT scan 10/04/2012.  Findings: The patient has a mixed attenuation extra-axial fluid collection over the left convexities consistent with chronic subdural hemorrhage with superimposed acute or subacute bleed.  The collection measures up to 1.7 cm in diameter compared to 2.3 cm on the prior study.  There is a left right midline shift of 0.5 cm compared to 0.7 cm on the prior exam.  The brain is atrophic with chronic microvascular ischemic change. No evidence of acute infarction, mass lesion, hydrocephalus or pneumocephalus is seen.  The calvarium is intact.  Imaged paranasal  sinuses are clear.  IMPRESSION: Chronic left subdural hemorrhage with superimposed acute or subacute bleed.  Associated 0.5 cm of left to right midline shift is identified.  Critical Value/emergent results were called by telephone at the time of interpretation on 02/14/2013 at 11:22 a.m. to Dr. Ranae Palms, who verbally acknowledged these results.   Original Report Authenticated By: Holley Dexter, M.D.    Assessment/Plan: Hospital day 1 for management of acute episode of right facial droop and expressive dysphasia CT scan showed a small amount of acute blood on a stable chronic subdural with less swelling less mass effect. In a 79 year old with a stable chronic subdural with minimal mass effect I recommend continued conservative treatment without burr hole craniectomy. Continue weight on the fracture of the antiepileptic 6 control the seizure activity his facial droop does seem to be some improved although is dysphasia is not. This does have some baseline dementia which could be exacerbating the symptoms of the dysphasia.  LOS: 1 day     Silvia Hightower P 02/15/2013, 9:20 AM

## 2013-02-15 NOTE — Progress Notes (Signed)
Subjective: Pt groggy, follow some commands   Objective: Vital signs in last 24 hours: Temp:  [97.4 F (36.3 C)-98.7 F (37.1 C)] 98.1 F (36.7 C) (08/29 0400) Pulse Rate:  [55-110] 64 (08/29 0700) Resp:  [15-36] 16 (08/29 0700) BP: (100-196)/(49-110) 100/58 mmHg (08/29 0700) SpO2:  [91 %-100 %] 98 % (08/29 0700) Weight:  [72.2 kg (159 lb 2.8 oz)-74 kg (163 lb 2.3 oz)] 74 kg (163 lb 2.3 oz) (08/29 0500) Weight change:  Last BM Date: 02/14/13  Intake/Output from previous day: 08/28 0701 - 08/29 0700 In: 990.8 [I.V.:990.8] Out: 101 [Urine:100; Emesis/NG output:1] Intake/Output this shift:    General appearance: slowed mentation Resp: clear to auscultation bilaterally Cardio: irregularly irregular rhythm Neurologic: Mental status: Alert, oriented, thought content appropriate, groggy  Lab Results:  Recent Labs  02/14/13 1103 02/14/13 1119  WBC 6.2  --   HGB 12.1* 11.9*  HCT 36.2* 35.0*  PLT 125*  --    BMET  Recent Labs  02/14/13 1103 02/14/13 1119 02/15/13 0420  NA 142 142 143  K 4.1 4.1 3.7  CL 105 103 104  CO2 30  --  29  GLUCOSE 113* 113* 123*  BUN 25* 26* 22  CREATININE 1.16 1.30 1.34  CALCIUM 9.6  --  9.1    Studies/Results: Ct Head (brain) Wo Contrast  02/14/2013   *RADIOLOGY REPORT*  Clinical Data: History of subdural hemorrhage.  CT HEAD WITHOUT CONTRAST  Technique:  Contiguous axial images were obtained from the base of the skull through the vertex without contrast.  Comparison: Head CT scan 10/04/2012.  Findings: The patient has a mixed attenuation extra-axial fluid collection over the left convexities consistent with chronic subdural hemorrhage with superimposed acute or subacute bleed.  The collection measures up to 1.7 cm in diameter compared to 2.3 cm on the prior study.  There is a left right midline shift of 0.5 cm compared to 0.7 cm on the prior exam.  The brain is atrophic with chronic microvascular ischemic change. No evidence of acute  infarction, mass lesion, hydrocephalus or pneumocephalus is seen.  The calvarium is intact.  Imaged paranasal sinuses are clear.  IMPRESSION: Chronic left subdural hemorrhage with superimposed acute or subacute bleed.  Associated 0.5 cm of left to right midline shift is identified.  Critical Value/emergent results were called by telephone at the time of interpretation on 02/14/2013 at 11:22 a.m. to Dr. Ranae Palms, who verbally acknowledged these results.   Original Report Authenticated By: Holley Dexter, M.D.    Medications: I have reviewed the patient's current medications.  Assessment/Plan: Seizure/ small subacute SDH with  Prior h/o of SDH drained- with confusion, MS change and facial drop- continue monitor neuro status; keppra per neurology Repeat CT tomorrow- f/u on SDH- no surgergy needed Dementia- moderate - continue aricept HTN- BP ok- if BP rise- restart HCTZ Afib- Rate control BB, no Anticoagulation BPH- Proscar, flomax PT/ OT/ Speech May tranfers to tele after 24 hrs if stable    LOS: 1 day   [name] 02/15/2013, 7:30 AM

## 2013-02-15 NOTE — Progress Notes (Signed)
Physical Therapy Evaluation Patient Details Name: [name] MRN: [id number] DOB: [birth year] Today's Date: 02/15/2013 Time: 6295-2841 PT Time Calculation (min): 14 min  PT Assessment / Plan / Recommendation History of Present Illness  Pt admitted with seizures.  Neuro work up in progress.   Clinical Impression  Pt admitted with seizures. Pt currently with functional limitations due to the deficits listed below (see PT Problem List). May need NHP with therapy depending on mental status improvement.  Will re-assess at next visit. Pt will benefit from skilled PT to increase their independence and safety with mobility to allow discharge to the venue listed below.     PT Assessment  Patient needs continued PT services    Follow Up Recommendations  SNF;Supervision/Assistance - 24 hour                Equipment Recommendations  Rolling walker with 5" wheels         Frequency Min 3X/week    Precautions / Restrictions Precautions Precautions: Fall Restrictions Weight Bearing Restrictions: No   Pertinent Vitals/Pain VSS, no pain      Mobility  Bed Mobility Bed Mobility: Rolling Right;Right Sidelying to Sit;Sitting - Scoot to Edge of Bed Rolling Right: 3: Mod assist Right Sidelying to Sit: 3: Mod assist Sitting - Scoot to Edge of Bed: 3: Mod assist Details for Bed Mobility Assistance: Pt needed assist to initiate movement.  Pt very lethargic.  Trying to arouse pt by assisting him to EOB.  Pt able to sit up however would not wake up enough to participate.   Transfers Transfers: Not assessed Ambulation/Gait Ambulation/Gait Assistance: Not tested (comment) Stairs: No Wheelchair Mobility Wheelchair Mobility: No         PT Diagnosis: Generalized weakness  PT Problem List: Decreased activity tolerance;Decreased balance;Decreased mobility;Decreased knowledge of use of DME;Decreased safety awareness;Decreased knowledge of precautions PT Treatment Interventions: Gait  training;DME instruction;Functional mobility training;Stair training;Therapeutic activities;Therapeutic exercise;Balance training;Patient/family education     PT Goals(Current goals can be found in the care plan section) Acute Rehab PT Goals Patient Stated Goal: unable to state PT Goal Formulation: Patient unable to participate in goal setting Time For Goal Achievement: 03/01/13 Potential to Achieve Goals: Fair  Visit Information  Last PT Received On: 02/15/13 Assistance Needed: +2 History of Present Illness: Pt admitted with seizures.  Neuro work up in progress.        Prior Functioning  Home Living Family/patient expects to be discharged to:: Private residence Living Arrangements: Spouse/significant other Available Help at Discharge: Family Type of Home: House Home Access: Stairs to enter Secretary/administrator of Steps: 6-8 Entrance Stairs-Rails: Left;Right Home Layout: Able to live on main level with bedroom/bathroom Home Equipment: None Prior Function Level of Independence: Independent Communication Communication: No difficulties Dominant Hand: Right    Cognition  Cognition Arousal/Alertness: Lethargic Behavior During Therapy: Flat affect Overall Cognitive Status: Impaired/Different from baseline Area of Impairment: Attention;Following commands;Safety/judgement;Orientation;Awareness;Problem solving Orientation Level: Disoriented to;Place;Time;Situation Current Attention Level: Focused Following Commands: Follows one step commands inconsistently;Follows one step commands with increased time Problem Solving: Decreased initiation;Requires verbal cues;Requires tactile cues General Comments: Pt very lethargic.  Difficult to assess.      Extremity/Trunk Assessment Upper Extremity Assessment Upper Extremity Assessment: Defer to OT evaluation Lower Extremity Assessment Lower Extremity Assessment: Generalized weakness   Balance Balance Balance Assessed: Yes Static  Sitting Balance Static Sitting - Balance Support: Bilateral upper extremity supported;Feet supported Static Sitting - Level of Assistance: 4: Min assist Static Sitting - Comment/# of Minutes:  2 min   End of Session PT - End of Session Equipment Utilized During Treatment: Gait belt Activity Tolerance: Patient limited by lethargy Patient left: in bed;with call bell/phone within reach;with bed alarm set Nurse Communication: Mobility status       INGOLD,Delany Steury 02/15/2013, 1:20 PM  [facility] [phone] (pager)

## 2013-02-16 ENCOUNTER — Inpatient Hospital Stay (HOSPITAL_COMMUNITY): Payer: Medicare Other

## 2013-02-16 DIAGNOSIS — I6200 Nontraumatic subdural hemorrhage, unspecified: Secondary | ICD-10-CM

## 2013-02-16 LAB — COMPREHENSIVE METABOLIC PANEL
Alkaline Phosphatase: 53 U/L (ref 39–117)
BUN: 20 mg/dL (ref 6–23)
Chloride: 106 mEq/L (ref 96–112)
GFR calc Af Amer: 65 mL/min — ABNORMAL LOW (ref >90)
Glucose, Bld: 98 mg/dL (ref 70–99)
Potassium: 3.6 mEq/L (ref 3.5–5.1)
Total Bilirubin: 1.7 mg/dL — ABNORMAL HIGH (ref 0.3–1.2)

## 2013-02-16 LAB — MAGNESIUM: Magnesium: 1.9 mg/dL (ref 1.5–2.5)

## 2013-02-16 NOTE — Progress Notes (Signed)
   Consulting physician: Dr. Donato Schultz  Brief followup note. Reviewed recent consult note. Telemetry shows atrial fibrillation as before, no clinically significant bradycardia, heart rate to 50s. No RVR either. Would continue low dose Lopressor for now and observe.  Jonelle Sidle, M.D., F.A.C.C.

## 2013-02-16 NOTE — Progress Notes (Signed)
SLP Cancellation Note  Patient Details Name: [name] MRN: [id number] DOB: February 22, 1920   Cancelled treatment:        Orders for BSE received. Per CT this am, pt has a new hemorrhagic infarct in the left frontal lobe.  Unable to complete BSE at this time due to pt somnolence. Spoke with RN and wife, and explained that SLP would continue efforts. Mariely Mahr B. Murvin [facility], CCC-SLP [phone]  Leigh Aurora 02/16/2013, 3:14 PM

## 2013-02-16 NOTE — Progress Notes (Signed)
Stroke Team Progress Note  HISTORY  Per admission notes:[name] is an 79 y.o. male who was normal night prior to admission. When he woke up  wife noted he was not as talkative as usual but she though nothing of it. She went to take a shower and when she finished he was in the bedroom and not following commands. She noted a right facial droop also. Patient has a home health nurse who cares for her son at home who recommended they call EMS. There was no jerking or tonic clonic movement noted but patient was incontinent. EMS was called and patient was brought to hospital as a code stroke. Initial CT head showed chronic left subdural hemorrhage with superimposed acute or subacute bleed.  Of note: patient is in Afib and has been in Afib since last bleed 06/23/12  Patient was not a TPA candidate secondary to ICH and SDH, outside of window.   SUBJECTIVE Per his daughter in law his mental status has fluctuated while in the hospital. He has become markedly less responsive in the past 12-24hrs, not following commands, not his normal personality. Non-verbal. No noted extremity twitching, no automatisms. Had Head CT this morning showing hemorrhagic infarct in L frontal lobe in addition to stable SDH. Patient unable to provide any history.  OBJECTIVE Most recent Vital Signs: Filed Vitals:   02/15/13 2355 02/16/13 0350 02/16/13 0400 02/16/13 0700  BP: 154/90  160/82 144/79  Pulse: 58  79 93  Temp: 98.2 F (36.8 C)  99.1 F (37.3 C) 98 F (36.7 C)  TempSrc: Axillary  Axillary Axillary  Resp: 17  21 20   Height:      Weight:   189 lb 13.1 oz (86.1 kg)   SpO2: 93% 88% 97% 97%   CBG (last 3)   Recent Labs  02/14/13 1058 02/15/13 0822  GLUCAP 114* 100*    IV Fluid Intake:   . sodium chloride 50 mL/hr at 02/15/13 2121    MEDICATIONS  . donepezil  5 mg Oral QHS  . finasteride  5 mg Oral QHS  . levETIRAcetam  500 mg Intravenous BID  . levothyroxine  25 mcg Oral Q1200  . metoprolol   12.5 mg Oral BID  . risperiDONE  1 mg Oral Daily  . simvastatin  20 mg Oral QPM  . tamsulosin  0.4 mg Oral Daily   PRN:  acetaminophen, acetaminophen, hydrALAZINE, hydrALAZINE, HYDROcodone-acetaminophen, ondansetron (ZOFRAN) IV  Diet:  NPO  Activity:  Bedrest DVT Prophylaxis:  Compression devices  CLINICALLY SIGNIFICANT STUDIES Basic Metabolic Panel:  Recent Labs Lab 02/15/13 0420 02/16/13 0420  NA 143 144  K 3.7 3.6  CL 104 106  CO2 29 29  GLUCOSE 123* 98  BUN 22 20  CREATININE 1.34 1.12  CALCIUM 9.1 9.1  MG 1.9 1.9   Liver Function Tests:  Recent Labs Lab 02/15/13 0420 02/16/13 0420  AST 17 16  ALT 8 9  ALKPHOS 53 53  BILITOT 1.5* 1.7*  PROT 5.8* 5.8*  ALBUMIN 2.9* 2.9*   CBC:  Recent Labs Lab 02/14/13 1103 02/14/13 1119  WBC 6.2  --   NEUTROABS 3.3  --   HGB 12.1* 11.9*  HCT 36.2* 35.0*  MCV 98.9  --   PLT 125*  --    Coagulation:  Recent Labs Lab 02/14/13 1103  LABPROT 14.5  INR 1.15   Cardiac Enzymes:  Recent Labs Lab 02/14/13 1103  TROPONINI <0.30   Urinalysis: No results found for this basename: COLORURINE,  APPERANCEUR, LABSPEC, PHURINE, GLUCOSEU, HGBUR, BILIRUBINUR, KETONESUR, PROTEINUR, UROBILINOGEN, NITRITE, LEUKOCYTESUR,  in the last 168 hours Lipid Panel No results found for this basename: chol, trig, hdl, cholhdl, vldl, ldlcalc   HgbA1C  No results found for this basename: HGBA1C    Urine Drug Screen:   No results found for this basename: labopia, cocainscrnur, labbenz, amphetmu, thcu, labbarb    Alcohol Level: No results found for this basename: ETH,  in the last 168 hours  Ct Head Wo Contrast  02/16/2013   *RADIOLOGY REPORT*  Clinical Data: Follow-up intracranial hemorrhage, subdural hemorrhage.  Difficult to arouse.  CT HEAD WITHOUT CONTRAST  Technique:  Contiguous axial images were obtained from the base of the skull through the vertex without contrast.  Comparison: Head CT from 2 days prior.  Findings:  No acute osseous  findings compared to priors.  No new soft tissue abnormality.  Mixed density left subdural hematoma continues to encompass the majority of the left cerebral convexity.  Size is similar to prior, measuring up to 1.7 cm in maximal thickness, with high density areas more discrete - likely coalescing clot.  There is new cytotoxic edema in the left frontal operculum and insula compatible with infarction.  There is multilobulated hemorrhage centrally, with multiple hematomas measuring up to 3 cm axially.  Midline shift is similar to prior, approximately 5 mm at the level of the third ventricle.  No hydrocephalus.  . Critical Value/emergent results were called by telephone at the time of interpretation on 02/16/2013 at 06:30 a.m. to Dr. Donette Larry, who verbally acknowledged these results.  IMPRESSION: 1. New hemorrhagic infarct in the left frontal lobe, mainly affecting the insula and frontal operculum.  2.  Size stable left convexity subdural hematoma, 1.7 cm in maximum thickness. 3.  Above hemorrhage and cytotoxic edema results in 5 mm rightward shift.   Original Report Authenticated By: Tiburcio Pea   Ct Head (brain) Wo Contrast  02/14/2013   *RADIOLOGY REPORT*  Clinical Data: History of subdural hemorrhage.  CT HEAD WITHOUT CONTRAST  Technique:  Contiguous axial images were obtained from the base of the skull through the vertex without contrast.  Comparison: Head CT scan 10/04/2012.  Findings: The patient has a mixed attenuation extra-axial fluid collection over the left convexities consistent with chronic subdural hemorrhage with superimposed acute or subacute bleed.  The collection measures up to 1.7 cm in diameter compared to 2.3 cm on the prior study.  There is a left right midline shift of 0.5 cm compared to 0.7 cm on the prior exam.  The brain is atrophic with chronic microvascular ischemic change. No evidence of acute infarction, mass lesion, hydrocephalus or pneumocephalus is seen.  The calvarium is intact.   Imaged paranasal sinuses are clear.  IMPRESSION: Chronic left subdural hemorrhage with superimposed acute or subacute bleed.  Associated 0.5 cm of left to right midline shift is identified.  Critical Value/emergent results were called by telephone at the time of interpretation on 02/14/2013 at 11:22 a.m. to Dr. Ranae Palms, who verbally acknowledged these results.   Original Report Authenticated By: Holley Dexter, M.D.     Physical Exam   Mental Status:  Lethargic and difficult to arouse. Opens eyes after constant noxious stimuli, will immediately close them without constant stimuli. Strongly localizes to pain using LUE. Withdraw of RUE and bilat LE to pain. Not following simple commands (show thumb, open eyes) , would not localize and track to voice of wife.  Cranial Nerves:  II: PERRL, does not track but  eyes appear to move full fields horizontally, no gaze preference/deviation noted, blinks to threat bilat, corneals intact, face grossly symmetric  Motor:  Moves all extremities against light resistance, LUE more movement than RUE, LE appear symmetric   Sensory: Withdraw to noxious stimuli in RUE, bilat LE, localizes in LUE  Bilat downgoing plantar response  ASSESSMENT Mr. KIPLING GRASER is a 79 y.o. male presenting initially with AMS likely 2/2 seizure activity, noted to have change in mental status while in hospital found to have acute L frontal ICH on exam. Based on history of A fib, off anticoagulation, and location would have concern for embolic event as etiology of bleed. Blood pressure appears stable in past 12 hours. Mental status still depressed though appears stable compared to prior note from Dr Thad Ranger on 8/29.  -will repeat head CT in AM of 8/31. Low threshold for repeat head CT if change in mental status -BP appears stable, oral therapy as indicated and cardene gtt as needed. Would aim loosely for goal SBP 140-160 -suggest MRI/A of the brain -2D echo and carotid  doppler -check HbA1c, lipid panel -tight glucose control -NPO pending improvement in mental status as he is aspiration risk -pneumatic compression devices for DVT ppx -continue keppra at current dose and schedule  Hospital day # 2  Elspeth Cho, DO Neurology-Stroke

## 2013-02-16 NOTE — Progress Notes (Signed)
Subjective: arousable appears comfortable  Objective: Vital signs in last 24 hours: Temp:  [97.8 F (36.6 C)-99.1 F (37.3 C)] 99.1 F (37.3 C) (08/30 0400) Pulse Rate:  [54-88] 79 (08/30 0400) Resp:  [16-22] 21 (08/30 0400) BP: (120-173)/(59-110) 160/82 mmHg (08/30 0400) SpO2:  [88 %-100 %] 97 % (08/30 0400) Weight:  [83.3 kg (183 lb 10.3 oz)-86.1 kg (189 lb 13.1 oz)] 86.1 kg (189 lb 13.1 oz) (08/30 0400) Weight change: 11.1 kg (24 lb 7.5 oz) Last BM Date: 02/15/13  Intake/Output from previous day: 08/29 0701 - 08/30 0700 In: 1195.8 [I.V.:1195.8] Out: 250 [Urine:250] Intake/Output this shift:    Resp: clear to auscultation bilaterally Cardio: regular rate and rhythm Neurologic: Mental status: Alert, oriented, thought content appropriate, arousable looks occasionally at examiner, no verbal response   Lab Results:  Recent Labs  02/14/13 1103 02/14/13 1119  WBC 6.2  --   HGB 12.1* 11.9*  HCT 36.2* 35.0*  PLT 125*  --    BMET  Recent Labs  02/15/13 0420 02/16/13 0420  NA 143 144  K 3.7 3.6  CL 104 106  CO2 29 29  GLUCOSE 123* 98  BUN 22 20  CREATININE 1.34 1.12  CALCIUM 9.1 9.1    Studies/Results: Ct Head Wo Contrast  02/16/2013   *RADIOLOGY REPORT*  Clinical Data: Follow-up intracranial hemorrhage, subdural hemorrhage.  Difficult to arouse.  CT HEAD WITHOUT CONTRAST  Technique:  Contiguous axial images were obtained from the base of the skull through the vertex without contrast.  Comparison: Head CT from 2 days prior.  Findings:  No acute osseous findings compared to priors.  No new soft tissue abnormality.  Mixed density left subdural hematoma continues to encompass the majority of the left cerebral convexity.  Size is similar to prior, measuring up to 1.7 cm in maximal thickness, with high density areas more discrete - likely coalescing clot.  There is new cytotoxic edema in the left frontal operculum and insula compatible with infarction.  There is  multilobulated hemorrhage centrally, with multiple hematomas measuring up to 3 cm axially.  Midline shift is similar to prior, approximately 5 mm at the level of the third ventricle.  No hydrocephalus.  . Critical Value/emergent results were called by telephone at the time of interpretation on 02/16/2013 at 06:30 a.m. to Dr. Donette Larry, who verbally acknowledged these results.  IMPRESSION: 1. New hemorrhagic infarct in the left frontal lobe, mainly affecting the insula and frontal operculum.  2.  Size stable left convexity subdural hematoma, 1.7 cm in maximum thickness. 3.  Above hemorrhage and cytotoxic edema results in 5 mm rightward shift.   Original Report Authenticated By: Tiburcio Pea   Ct Head (brain) Wo Contrast  02/14/2013   *RADIOLOGY REPORT*  Clinical Data: History of subdural hemorrhage.  CT HEAD WITHOUT CONTRAST  Technique:  Contiguous axial images were obtained from the base of the skull through the vertex without contrast.  Comparison: Head CT scan 10/04/2012.  Findings: The patient has a mixed attenuation extra-axial fluid collection over the left convexities consistent with chronic subdural hemorrhage with superimposed acute or subacute bleed.  The collection measures up to 1.7 cm in diameter compared to 2.3 cm on the prior study.  There is a left right midline shift of 0.5 cm compared to 0.7 cm on the prior exam.  The brain is atrophic with chronic microvascular ischemic change. No evidence of acute infarction, mass lesion, hydrocephalus or pneumocephalus is seen.  The calvarium is intact.  Imaged paranasal  sinuses are clear.  IMPRESSION: Chronic left subdural hemorrhage with superimposed acute or subacute bleed.  Associated 0.5 cm of left to right midline shift is identified.  Critical Value/emergent results were called by telephone at the time of interpretation on 02/14/2013 at 11:22 a.m. to Dr. Ranae Palms, who verbally acknowledged these results.   Original Report Authenticated By: Holley Dexter, M.D.    Medications:  Scheduled: . donepezil  5 mg Oral QHS  . finasteride  5 mg Oral QHS  . levETIRAcetam  500 mg Intravenous BID  . levothyroxine  25 mcg Oral Q1200  . metoprolol  12.5 mg Oral BID  . risperiDONE  1 mg Oral Daily  . simvastatin  20 mg Oral QPM  . tamsulosin  0.4 mg Oral Daily    Assessment/Plan: 1. Stroke:called report on imaging reveals new right frontal hemorrhagic infarct, this likely explains significant decline in cognition.  I have discussed with son and daughter in law. Await neuro input, supportive care at present 2. HTN bp acceptable 3. afib rate controlled 4. Subdural no change seen 5 seizure no activity seen  LOS: 2 days   [facility] 02/16/2013, 7:03 AM

## 2013-02-16 NOTE — Progress Notes (Signed)
Subjective: Patient arousable  - no verbal output  Objective: Vital signs in last 24 hours: Temp:  [97.8 F (36.6 C)-99.1 F (37.3 C)] 99.1 F (37.3 C) (08/30 0400) Pulse Rate:  [54-88] 79 (08/30 0400) Resp:  [16-22] 21 (08/30 0400) BP: (120-173)/(59-110) 160/82 mmHg (08/30 0400) SpO2:  [88 %-100 %] 97 % (08/30 0400) Weight:  [83.3 kg (183 lb 10.3 oz)-86.1 kg (189 lb 13.1 oz)] 86.1 kg (189 lb 13.1 oz) (08/30 0400)  Intake/Output from previous day: 08/29 0701 - 08/30 0700 In: 1195.8 [I.V.:1195.8] Out: 250 [Urine:250] Intake/Output this shift:    easily arouses,no verbal output( nurse reports groans at times) - moves all 4 , not to command  Lab Results:  Recent Labs  02/14/13 1103 02/14/13 1119  WBC 6.2  --   HGB 12.1* 11.9*  HCT 36.2* 35.0*  PLT 125*  --    BMET  Recent Labs  02/15/13 0420 02/16/13 0420  NA 143 144  K 3.7 3.6  CL 104 106  CO2 29 29  GLUCOSE 123* 98  BUN 22 20  CREATININE 1.34 1.12  CALCIUM 9.1 9.1    Studies/Results: Ct Head Wo Contrast  02/16/2013   *RADIOLOGY REPORT*  Clinical Data: Follow-up intracranial hemorrhage, subdural hemorrhage.  Difficult to arouse.  CT HEAD WITHOUT CONTRAST  Technique:  Contiguous axial images were obtained from the base of the skull through the vertex without contrast.  Comparison: Head CT from 2 days prior.  Findings:  No acute osseous findings compared to priors.  No new soft tissue abnormality.  Mixed density left subdural hematoma continues to encompass the majority of the left cerebral convexity.  Size is similar to prior, measuring up to 1.7 cm in maximal thickness, with high density areas more discrete - likely coalescing clot.  There is new cytotoxic edema in the left frontal operculum and insula compatible with infarction.  There is multilobulated hemorrhage centrally, with multiple hematomas measuring up to 3 cm axially.  Midline shift is similar to prior, approximately 5 mm at the level of the third  ventricle.  No hydrocephalus.  . Critical Value/emergent results were called by telephone at the time of interpretation on 02/16/2013 at 06:30 a.m. to Dr. Donette Larry, who verbally acknowledged these results.  IMPRESSION: 1. New hemorrhagic infarct in the left frontal lobe, mainly affecting the insula and frontal operculum.  2.  Size stable left convexity subdural hematoma, 1.7 cm in maximum thickness. 3.  Above hemorrhage and cytotoxic edema results in 5 mm rightward shift.   Original Report Authenticated By: Tiburcio Pea   Ct Head (brain) Wo Contrast  02/14/2013   *RADIOLOGY REPORT*  Clinical Data: History of subdural hemorrhage.  CT HEAD WITHOUT CONTRAST  Technique:  Contiguous axial images were obtained from the base of the skull through the vertex without contrast.  Comparison: Head CT scan 10/04/2012.  Findings: The patient has a mixed attenuation extra-axial fluid collection over the left convexities consistent with chronic subdural hemorrhage with superimposed acute or subacute bleed.  The collection measures up to 1.7 cm in diameter compared to 2.3 cm on the prior study.  There is a left right midline shift of 0.5 cm compared to 0.7 cm on the prior exam.  The brain is atrophic with chronic microvascular ischemic change. No evidence of acute infarction, mass lesion, hydrocephalus or pneumocephalus is seen.  The calvarium is intact.  Imaged paranasal sinuses are clear.  IMPRESSION: Chronic left subdural hemorrhage with superimposed acute or subacute bleed.  Associated 0.5  cm of left to right midline shift is identified.  Critical Value/emergent results were called by telephone at the time of interpretation on 02/14/2013 at 11:22 a.m. to Dr. Ranae Palms, who verbally acknowledged these results.   Original Report Authenticated By: Holley Dexter, M.D.    Assessment/Plan: CT now shows Left frontal hemorrhagic infarct that explains his new deficits - SDH stable  - minimal mass effect stable from previous scan    -    No new neurosurgical recs  LOS: 2 days     Malik Paar R, MD 02/16/2013, 7:50 AM

## 2013-02-17 ENCOUNTER — Other Ambulatory Visit: Payer: Self-pay | Admitting: Neurology

## 2013-02-17 ENCOUNTER — Inpatient Hospital Stay (HOSPITAL_COMMUNITY): Payer: Medicare Other

## 2013-02-17 DIAGNOSIS — I6200 Nontraumatic subdural hemorrhage, unspecified: Secondary | ICD-10-CM

## 2013-02-17 LAB — COMPREHENSIVE METABOLIC PANEL
AST: 16 U/L (ref 0–37)
Albumin: 3 g/dL — ABNORMAL LOW (ref 3.5–5.2)
Calcium: 9.3 mg/dL (ref 8.4–10.5)
Creatinine, Ser: 1.01 mg/dL (ref 0.50–1.35)
GFR calc non Af Amer: 63 mL/min — ABNORMAL LOW (ref >90)

## 2013-02-17 LAB — GLUCOSE, CAPILLARY: Glucose-Capillary: 93 mg/dL (ref 70–99)

## 2013-02-17 MED ORDER — MAGNESIUM SULFATE 40 MG/ML IJ SOLN
2.0000 g | Freq: Once | INTRAMUSCULAR | Status: AC
  Administered 2013-02-17: 2 g via INTRAVENOUS
  Filled 2013-02-17: qty 50

## 2013-02-17 MED ORDER — POTASSIUM CHLORIDE CRYS ER 20 MEQ PO TBCR
40.0000 meq | EXTENDED_RELEASE_TABLET | Freq: Once | ORAL | Status: AC
  Administered 2013-02-17: 40 meq via ORAL

## 2013-02-17 MED ORDER — METOPROLOL TARTRATE 25 MG PO TABS
25.0000 mg | ORAL_TABLET | Freq: Two times a day (BID) | ORAL | Status: DC
  Administered 2013-02-17 (×2): 25 mg via ORAL
  Filled 2013-02-17 (×6): qty 1

## 2013-02-17 NOTE — Progress Notes (Signed)
Head CT essentially unchanged. Patient with left frontal infarct with some secondary hemorrhage. I recommend continued supportive care. No indication for neurosurgical intervention at present.

## 2013-02-17 NOTE — Progress Notes (Signed)
Subjective: Alert, denies pain  Objective: Vital signs in last 24 hours: Temp:  [97.4 F (36.3 C)-99 F (37.2 C)] 97.9 F (36.6 C) (08/31 0353) Pulse Rate:  [69-98] 98 (08/31 0705) Resp:  [15-24] 22 (08/31 0705) BP: (162-193)/(80-115) 174/90 mmHg (08/31 0705) SpO2:  [93 %-95 %] 94 % (08/31 0705) Weight:  [83.1 kg (183 lb 3.2 oz)] 83.1 kg (183 lb 3.2 oz) (08/31 0353) Weight change: -0.2 kg (-7.1 oz) Last BM Date: 02/15/13  Intake/Output from previous day: 08/30 0701 - 08/31 0700 In: 1300 [I.V.:1200; IV Piggyback:100] Out: 850 [Urine:850] Intake/Output this shift:    Resp: clear to auscultation bilaterally Cardio: irregularly irregular rhythm Neurologic: Mental status: Alert, oriented, thought content appropriate, looks at examiner, more alert oriented to name and age  Lab Results:  Recent Labs  02/14/13 1103 02/14/13 1119  WBC 6.2  --   HGB 12.1* 11.9*  HCT 36.2* 35.0*  PLT 125*  --    BMET  Recent Labs  02/16/13 0420 02/17/13 0400  NA 144 144  K 3.6 3.5  CL 106 107  CO2 29 25  GLUCOSE 98 90  BUN 20 22  CREATININE 1.12 1.01  CALCIUM 9.1 9.3    Studies/Results: Ct Head Wo Contrast  02/17/2013   *RADIOLOGY REPORT*  Clinical Data: Follow-up subdural hematoma.  CT HEAD WITHOUT CONTRAST  Technique:  Contiguous axial images were obtained from the base of the skull through the vertex without contrast.  Comparison: Head CT from yesterday.  Findings:  No acute osseous findings.  No new soft tissue abnormalities. Bilateral cataract resection.  Chronic proptosis with increased intra orbital fat.  No increase in left convexity subdural hemorrhage, measuring up to 1.7 cm in perpendicular dimension.  A hemorrhagic infarct in the lateral left frontal lobe, involving the frontal operculum, anterior insula, and high left frontal lobe is unchanged in extent.  The multi lobulated hematoma is also not changed, with the largest component measuring up to 3 cm diameter.  Rightward  midline shift is stable when measured at the same level, 7mm at the septum pellucidum.  No evidence of acute ischemia.  No hydrocephalus.  Chronic small vessel ischemic changes.  Global cerebral atrophy. Extensive intracranial atherosclerosis.  IMPRESSION:  1.  Left frontal hemorrhagic infarct is unchanged in extent.  No increase in the hemorrhagic component. 2.  Similar volume left convexity subdural hematoma. 3.  Unchanged rightward shift, 7 mm at the septum pellucidum.   Original Report Authenticated By: Tiburcio Pea   Ct Head Wo Contrast  02/16/2013   *RADIOLOGY REPORT*  Clinical Data: Follow-up intracranial hemorrhage, subdural hemorrhage.  Difficult to arouse.  CT HEAD WITHOUT CONTRAST  Technique:  Contiguous axial images were obtained from the base of the skull through the vertex without contrast.  Comparison: Head CT from 2 days prior.  Findings:  No acute osseous findings compared to priors.  No new soft tissue abnormality.  Mixed density left subdural hematoma continues to encompass the majority of the left cerebral convexity.  Size is similar to prior, measuring up to 1.7 cm in maximal thickness, with high density areas more discrete - likely coalescing clot.  There is new cytotoxic edema in the left frontal operculum and insula compatible with infarction.  There is multilobulated hemorrhage centrally, with multiple hematomas measuring up to 3 cm axially.  Midline shift is similar to prior, approximately 5 mm at the level of the third ventricle.  No hydrocephalus.  . Critical Value/emergent results were called by telephone at  the time of interpretation on 02/16/2013 at 06:30 a.m. to Dr. Donette Larry, who verbally acknowledged these results.  IMPRESSION: 1. New hemorrhagic infarct in the left frontal lobe, mainly affecting the insula and frontal operculum.  2.  Size stable left convexity subdural hematoma, 1.7 cm in maximum thickness. 3.  Above hemorrhage and cytotoxic edema results in 5 mm rightward shift.    Original Report Authenticated By: Tiburcio Pea    Medications:  Scheduled: . donepezil  5 mg Oral QHS  . finasteride  5 mg Oral QHS  . levETIRAcetam  500 mg Intravenous BID  . levothyroxine  25 mcg Oral Q1200  . metoprolol  12.5 mg Oral BID  . risperiDONE  1 mg Oral Daily  . simvastatin  20 mg Oral QPM  . tamsulosin  0.4 mg Oral Daily    Assessment/Plan: 1. Left frontal ICH and subdural not change on Ct brain this am, however mental status slightly better.  Will need to reassess swallowing 2. afib rate controlled 3. Fair control would not be more aggressive at age 79 with recent cns event 4. Advanced directives, I spoke with wife yesterday and she states he has made previous comments no heroic measures if poor quality of life.  Family desires DNR status   5/ 5 hydration maintained with iv fluids will check lab in am  LOS: 3 days   [facility] 02/17/2013, 8:12 AM

## 2013-02-17 NOTE — Progress Notes (Signed)
Subjective:   More awake. Remains in AF (chronic since 1/14). Had a few brief runs NSVT (3-4 beats).   AF rate 90-105. Lopressor increased to 25 bid by stroke team.      Intake/Output Summary (Last 24 hours) at 02/17/13 0949 Last data filed at 02/17/13 0700  Gross per 24 hour  Intake   1200 ml  Output    850 ml  Net    350 ml    Current meds: . donepezil  5 mg Oral QHS  . finasteride  5 mg Oral QHS  . levETIRAcetam  500 mg Intravenous BID  . levothyroxine  25 mcg Oral Q1200  . metoprolol  25 mg Oral BID  . risperiDONE  1 mg Oral Daily  . simvastatin  20 mg Oral QPM  . tamsulosin  0.4 mg Oral Daily   Infusions: . sodium chloride 50 mL/hr at 02/15/13 2121     Objective:  Blood pressure 174/90, pulse 98, temperature 97.9 F (36.6 C), temperature source Oral, resp. rate 22, height 5\' 8"  (1.727 m), weight 83.1 kg (183 lb 3.2 oz), SpO2 94.00%. Weight change: -0.2 kg (-7.1 oz)  Physical Exam: General:  Elderly frail appearing. No resp difficulty HEENT: normal Neck: supple. JVP 6. Carotids 2+ bilat; no bruits. No lymphadenopathy or thryomegaly appreciated. Cor: PMI nondisplaced. Irregular Irregula Lungs: clear Abdomen: soft, nontender, nondistended. No hepatosplenomegaly. No bruits or masses. Good bowel sounds. Extremities: no cyanosis, clubbing, rash, edema Neuro: awake. Follows some commands. weak  Lab Results: Basic Metabolic Panel:  Recent Labs Lab 02/14/13 1103 02/14/13 1119 02/15/13 0420 02/16/13 0420 02/17/13 0400  NA 142 142 143 144 144  K 4.1 4.1 3.7 3.6 3.5  CL 105 103 104 106 107  CO2 30  --  29 29 25   GLUCOSE 113* 113* 123* 98 90  BUN 25* 26* 22 20 22   CREATININE 1.16 1.30 1.34 1.12 1.01  CALCIUM 9.6  --  9.1 9.1 9.3  MG  --   --  1.9 1.9 1.9   Liver Function Tests:  Recent Labs Lab 02/14/13 1103 02/15/13 0420 02/16/13 0420 02/17/13 0400  AST 16 17 16 16   ALT 9 8 9 8   ALKPHOS 58 53 53 54  BILITOT 1.3* 1.5* 1.7* 2.2*  PROT 6.3 5.8*  5.8* 5.9*  ALBUMIN 3.2* 2.9* 2.9* 3.0*   No results found for this basename: LIPASE, AMYLASE,  in the last 168 hours No results found for this basename: AMMONIA,  in the last 168 hours CBC:  Recent Labs Lab 02/14/13 1103 02/14/13 1119  WBC 6.2  --   NEUTROABS 3.3  --   HGB 12.1* 11.9*  HCT 36.2* 35.0*  MCV 98.9  --   PLT 125*  --    Cardiac Enzymes:  Recent Labs Lab 02/14/13 1103  TROPONINI <0.30   BNP: No components found with this basename: POCBNP,  CBG:  Recent Labs Lab 02/14/13 1058 02/15/13 0822 02/17/13 0800  GLUCAP 114* 100* 93   Microbiology: No results found for this basename: cult   No results found for this basename: CULT, SDES,  in the last 168 hours  Imaging: Ct Head Wo Contrast  02/17/2013   *RADIOLOGY REPORT*  Clinical Data: Follow-up subdural hematoma.  CT HEAD WITHOUT CONTRAST  Technique:  Contiguous axial images were obtained from the base of the skull through the vertex without contrast.  Comparison: Head CT from yesterday.  Findings:  No acute osseous findings.  No new soft tissue abnormalities. Bilateral  cataract resection.  Chronic proptosis with increased intra orbital fat.  No increase in left convexity subdural hemorrhage, measuring up to 1.7 cm in perpendicular dimension.  A hemorrhagic infarct in the lateral left frontal lobe, involving the frontal operculum, anterior insula, and high left frontal lobe is unchanged in extent.  The multi lobulated hematoma is also not changed, with the largest component measuring up to 3 cm diameter.  Rightward midline shift is stable when measured at the same level, 7mm at the septum pellucidum.  No evidence of acute ischemia.  No hydrocephalus.  Chronic small vessel ischemic changes.  Global cerebral atrophy. Extensive intracranial atherosclerosis.  IMPRESSION:  1.  Left frontal hemorrhagic infarct is unchanged in extent.  No increase in the hemorrhagic component. 2.  Similar volume left convexity subdural  hematoma. 3.  Unchanged rightward shift, 7 mm at the septum pellucidum.   Original Report Authenticated By: Tiburcio Pea   Ct Head Wo Contrast  02/16/2013   *RADIOLOGY REPORT*  Clinical Data: Follow-up intracranial hemorrhage, subdural hemorrhage.  Difficult to arouse.  CT HEAD WITHOUT CONTRAST  Technique:  Contiguous axial images were obtained from the base of the skull through the vertex without contrast.  Comparison: Head CT from 2 days prior.  Findings:  No acute osseous findings compared to priors.  No new soft tissue abnormality.  Mixed density left subdural hematoma continues to encompass the majority of the left cerebral convexity.  Size is similar to prior, measuring up to 1.7 cm in maximal thickness, with high density areas more discrete - likely coalescing clot.  There is new cytotoxic edema in the left frontal operculum and insula compatible with infarction.  There is multilobulated hemorrhage centrally, with multiple hematomas measuring up to 3 cm axially.  Midline shift is similar to prior, approximately 5 mm at the level of the third ventricle.  No hydrocephalus.  . Critical Value/emergent results were called by telephone at the time of interpretation on 02/16/2013 at 06:30 a.m. to Dr. Donette Larry, who verbally acknowledged these results.  IMPRESSION: 1. New hemorrhagic infarct in the left frontal lobe, mainly affecting the insula and frontal operculum.  2.  Size stable left convexity subdural hematoma, 1.7 cm in maximum thickness. 3.  Above hemorrhage and cytotoxic edema results in 5 mm rightward shift.   Original Report Authenticated By: Tiburcio Pea     ASSESSMENT:  1. AF, chronic  2. CVA, hemorrhagic 3. SDH 4. NSVT 5. Hypokalemia  PLAN/DISCUSSION:  Overall stable from cardiac perspective. Agree with increase of lopressor to 25 bid. Will supp K and check. Mag. Ideally would have K >= 4.0 and Mg >= 2.0. No anticoagulation due to bleed.    LOS: 3 days    Arvilla Meres,  MD 02/17/2013, 9:49 AM

## 2013-02-17 NOTE — Evaluation (Signed)
Clinical/Bedside Swallow Evaluation Patient Details  Name: [name] MRN: [id number] Date of Birth: 1920/03/25  Today's Date: 02/17/2013 Time: 1000-1041 SLP Time Calculation (min): 41 min  Past Medical History:  Past Medical History  Diagnosis Date  . Dementia   . A-fib   . Enlarged prostate   . Thyroid disease     hypothyroid  . Hypertension    Past Surgical History:  Past Surgical History  Procedure Laterality Date  . Eye surgery     HPI:  Per admission notes:[name] is an 79 y.o. male who was normal night prior to admission. When he woke up  wife noted he was not as talkative as usual but she though nothing of it. She went to take a shower and when she finished he was in the bedroom and not following commands. She noted a right facial droop also. Patient has a home health nurse who cares for her son at home who recommended they call EMS. There was no jerking or tonic clonic movement noted but patient was incontinent. EMS was called and patient was brought to hospital as a code stroke. Initial CT head showed chronic left subdural hemorrhage with superimposed acute or subacute bleed.   Per his daughter in law his mental status has fluctuated while in the hospital. He has become markedly less responsive in the past 12-24hrs, not following commands, not his normal personality. Non-verbal. No noted extremity twitching, no automatisms. Had Head CT this morning showing hemorrhagic infarct in L frontal lobe in addition to stable SDH. Patient unable to provide any history.  BSE ordered due to noted coughing with thin liquids.      Assessment / Plan / Recommendation Clinical Impression  Change in swallow status in comparison to results of BSE completed on 8/29.  Patient alert but unable to complete volitional throat clear, swallow, and cough.  Poor oral awareness with oral holding of PO trials of thin liquids and puree.  Delay in initiation of swallow with reduced hyoid  laryngeal elevation with swallows in succession.  Recommend NPO with exception of medication crushed and administered in puree. Patient presenting with decreased ability to protect his airway fully with PO's administered consecutively due to weakness exacerbated by current cognitive status.  Recommend ice chips PRN s/p oral care.  ST to reassess PO readiness on 02/18/13.  Sharee Pimple ability to resume PO's good as cognition improves.      Aspiration Risk  Moderate    Diet Recommendation NPO except meds;Ice chips PRN after oral care        Other  Recommendations Oral Care Recommendations: Oral care QID   Follow Up Recommendations  Inpatient Rehab    Frequency and Duration min 2x/week  2 weeks   Pertinent Vitals/Pain  RR 23 to 26 s/p PO trials    SLP Swallow Goals Goal #3: Consume diagnostic PO trials administered by SLP with improved ability to utilize instructed compensatory strategies to determine PO readiness   Swallow Study Prior Functional Status   Regular diet/thin liquids    General Date of Onset: 02/14/13 HPI: Per admission notes:[name] is an 79 y.o. male who was normal night prior to admission. When he woke up  wife noted he was not as talkative as usual but she though nothing of it. She went to take a shower and when she finished he was in the bedroom and not following commands. She noted a right facial droop also. Patient has a home health nurse who cares  for her son at home who recommended they call EMS. There was no jerking or tonic clonic movement noted but patient was incontinent. EMS was called and patient was brought to hospital as a code stroke. Initial CT head showed chronic left subdural hemorrhage with superimposed acute or subacute bleed.   Type of Study: Bedside swallow evaluation Previous Swallow Assessment: BSE 02/14/13 Diet Prior to this Study: NPO Temperature Spikes Noted: No Respiratory Status: Room air History of Recent Intubation:  No Behavior/Cognition: Alert;Pleasant mood;Requires cueing;Doesn't follow directions;Confused Oral Cavity - Dentition: Dentures, top;Dentures, bottom Self-Feeding Abilities: Needs assist;Able to feed self Patient Positioning: Upright in bed Baseline Vocal Quality: Low vocal intensity Volitional Cough: Cognitively unable to elicit Volitional Swallow: Unable to elicit    Oral/Motor/Sensory Function Overall Oral Motor/Sensory Function: Impaired Labial ROM: Reduced right Labial Symmetry: Abnormal symmetry right Labial Strength: Reduced Labial Sensation: Reduced Lingual ROM: Reduced right Lingual Symmetry: Abnormal symmetry right Lingual Strength: Reduced Lingual Sensation: Reduced Facial ROM: Reduced right Facial Symmetry: Right droop Facial Strength: Reduced Facial Sensation: Reduced Velum: Within Functional Limits Mandible: Within Functional Limits   Ice Chips Ice chips: Impaired Oral Phase Impairments: Poor awareness of bolus Oral Phase Functional Implications: Oral holding Pharyngeal Phase Impairments: Suspected delayed Swallow;Decreased hyoid-laryngeal movement   Thin Liquid Thin Liquid: Impaired Presentation: Cup;Spoon Oral Phase Impairments: Reduced lingual movement/coordination Pharyngeal  Phase Impairments: Suspected delayed Swallow;Decreased hyoid-laryngeal movement;Multiple swallows    Nectar Thick Nectar Thick Liquid: Not tested   Honey Thick Honey Thick Liquid: Not tested   Puree Puree: Impaired Oral Phase Impairments: Poor awareness of bolus Oral Phase Functional Implications: Oral holding Pharyngeal Phase Impairments: Suspected delayed Swallow;Decreased hyoid-laryngeal movement;Multiple swallows   Solid   GO    Solid: Not tested      Moreen Fowler MS, CCC-SLP [phone] [facility] 02/17/2013,11:03 AM

## 2013-02-17 NOTE — Progress Notes (Addendum)
Stroke Team Progress Note  HISTORY  Per admission notes:[name] is an 79 y.o. male who was normal night prior to admission. When he woke up  wife noted he was not as talkative as usual but she though nothing of it. She went to take a shower and when she finished he was in the bedroom and not following commands. She noted a right facial droop also. Patient has a home health nurse who cares for her son at home who recommended they call EMS. There was no jerking or tonic clonic movement noted but patient was incontinent. EMS was called and patient was brought to hospital as a code stroke. Initial CT head showed chronic left subdural hemorrhage with superimposed acute or subacute bleed.  Of note: patient is in Afib and has been in Afib since last bleed 06/23/12  Patient was not a TPA candidate secondary to ICH and SDH, outside of window.   SUBJECTIVE Son at bedside, states dads mental status markedly improved yesterday afternoon. States he is a little more lethargic this morning but is just waking up. Yesterday was interactive, answering questions, watching a movie. Currently able to tell me his name and follow simple commands (look at light, close eyes). Had CT head this AM which was stable compared to prior imaging. Per RN no overnight events, BP and HR have started to creep up.  Was unable to have SLP BSE yesterday due to mental status.   OBJECTIVE Most recent Vital Signs: Filed Vitals:   02/16/13 2329 02/17/13 0353 02/17/13 0551 02/17/13 0705  BP: 162/96 181/88 193/97 174/90  Pulse: 86 94  98  Temp: 99 F (37.2 C) 97.9 F (36.6 C)    TempSrc: Oral Oral    Resp: 19 15  22   Height:      Weight:  183 lb 3.2 oz (83.1 kg)    SpO2: 94% 93%  94%   CBG (last 3)   Recent Labs  02/14/13 1058 02/15/13 0822 02/17/13 0800  GLUCAP 114* 100* 93    IV Fluid Intake:   . sodium chloride 50 mL/hr at 02/15/13 2121    MEDICATIONS  . donepezil  5 mg Oral QHS  . finasteride  5 mg Oral QHS   . levETIRAcetam  500 mg Intravenous BID  . levothyroxine  25 mcg Oral Q1200  . metoprolol  12.5 mg Oral BID  . risperiDONE  1 mg Oral Daily  . simvastatin  20 mg Oral QPM  . tamsulosin  0.4 mg Oral Daily   PRN:  acetaminophen, acetaminophen, hydrALAZINE, hydrALAZINE, HYDROcodone-acetaminophen, ondansetron (ZOFRAN) IV  Diet:  NPO  Activity:  Bedrest DVT Prophylaxis:  Compression devices  CLINICALLY SIGNIFICANT STUDIES Basic Metabolic Panel:   Recent Labs Lab 02/16/13 0420 02/17/13 0400  NA 144 144  K 3.6 3.5  CL 106 107  CO2 29 25  GLUCOSE 98 90  BUN 20 22  CREATININE 1.12 1.01  CALCIUM 9.1 9.3  MG 1.9 1.9   Liver Function Tests:   Recent Labs Lab 02/16/13 0420 02/17/13 0400  AST 16 16  ALT 9 8  ALKPHOS 53 54  BILITOT 1.7* 2.2*  PROT 5.8* 5.9*  ALBUMIN 2.9* 3.0*   CBC:   Recent Labs Lab 02/14/13 1103 02/14/13 1119  WBC 6.2  --   NEUTROABS 3.3  --   HGB 12.1* 11.9*  HCT 36.2* 35.0*  MCV 98.9  --   PLT 125*  --    Coagulation:   Recent Labs Lab 02/14/13  1103  LABPROT 14.5  INR 1.15   Cardiac Enzymes:   Recent Labs Lab 02/14/13 1103  TROPONINI <0.30   Urinalysis: No results found for this basename: COLORURINE, APPERANCEUR, LABSPEC, PHURINE, GLUCOSEU, HGBUR, BILIRUBINUR, KETONESUR, PROTEINUR, UROBILINOGEN, NITRITE, LEUKOCYTESUR,  in the last 168 hours Lipid Panel No results found for this basename: chol,  trig,  hdl,  cholhdl,  vldl,  ldlcalc   HgbA1C  No results found for this basename: HGBA1C    Urine Drug Screen:   No results found for this basename: labopia,  cocainscrnur,  labbenz,  amphetmu,  thcu,  labbarb    Alcohol Level: No results found for this basename: ETH,  in the last 168 hours  Ct Head Wo Contrast  02/17/2013   *RADIOLOGY REPORT*  Clinical Data: Follow-up subdural hematoma.  CT HEAD WITHOUT CONTRAST  Technique:  Contiguous axial images were obtained from the base of the skull through the vertex without contrast.   Comparison: Head CT from yesterday.  Findings:  No acute osseous findings.  No new soft tissue abnormalities. Bilateral cataract resection.  Chronic proptosis with increased intra orbital fat.  No increase in left convexity subdural hemorrhage, measuring up to 1.7 cm in perpendicular dimension.  A hemorrhagic infarct in the lateral left frontal lobe, involving the frontal operculum, anterior insula, and high left frontal lobe is unchanged in extent.  The multi lobulated hematoma is also not changed, with the largest component measuring up to 3 cm diameter.  Rightward midline shift is stable when measured at the same level, 7mm at the septum pellucidum.  No evidence of acute ischemia.  No hydrocephalus.  Chronic small vessel ischemic changes.  Global cerebral atrophy. Extensive intracranial atherosclerosis.  IMPRESSION:  1.  Left frontal hemorrhagic infarct is unchanged in extent.  No increase in the hemorrhagic component. 2.  Similar volume left convexity subdural hematoma. 3.  Unchanged rightward shift, 7 mm at the septum pellucidum.   Original Report Authenticated By: Tiburcio Pea   Ct Head Wo Contrast  02/16/2013   *RADIOLOGY REPORT*  Clinical Data: Follow-up intracranial hemorrhage, subdural hemorrhage.  Difficult to arouse.  CT HEAD WITHOUT CONTRAST  Technique:  Contiguous axial images were obtained from the base of the skull through the vertex without contrast.  Comparison: Head CT from 2 days prior.  Findings:  No acute osseous findings compared to priors.  No new soft tissue abnormality.  Mixed density left subdural hematoma continues to encompass the majority of the left cerebral convexity.  Size is similar to prior, measuring up to 1.7 cm in maximal thickness, with high density areas more discrete - likely coalescing clot.  There is new cytotoxic edema in the left frontal operculum and insula compatible with infarction.  There is multilobulated hemorrhage centrally, with multiple hematomas measuring  up to 3 cm axially.  Midline shift is similar to prior, approximately 5 mm at the level of the third ventricle.  No hydrocephalus.  . Critical Value/emergent results were called by telephone at the time of interpretation on 02/16/2013 at 06:30 a.m. to Dr. Donette Larry, who verbally acknowledged these results.  IMPRESSION: 1. New hemorrhagic infarct in the left frontal lobe, mainly affecting the insula and frontal operculum.  2.  Size stable left convexity subdural hematoma, 1.7 cm in maximum thickness. 3.  Above hemorrhage and cytotoxic edema results in 5 mm rightward shift.   Original Report Authenticated By: Tiburcio Pea     Physical Exam   Mental Status:  Eyes open spontaneously, makes some eye  contact, will state name, follow simple commands, tracks light, locks gaze on son when asked.   Cranial Nerves:  II: PERRL, tracks flashlight fully horizontal and vertical gaze, no gaze preference/deviation noted, blinks to threat bilat, corneals intact, face grossly symmetric  Motor:  Moves all extremities against light resistance, LUE more movement than RUE, LE appear symmetric   Sensory: Withdraw to noxious stimuli in RUE, bilat LE, localizes in LUE  Bilat downgoing plantar response  ASSESSMENT [name] is a 79 y.o. male presenting initially with AMS likely 2/2 seizure activity, noted to have change in mental status while in hospital found to have acute L frontal ICH on exam. Based on history of A fib, off anticoagulation, and location would have concern for embolic event as etiology of bleed. Mental status markedly improved compared to prior evaluation yesterday. Head CT stable ICH size. Blood pressure and heart rate starting to increase.  -Low threshold for repeat head CT if change in mental status -will increase metoprolol to 25mg  bid.  Would aim loosely for goal SBP 140-160 -suggest MRI/A of the brain -2D echo ordered -check HbA1c, lipid panel -tight glucose control -NPO pending  BSE by SLP -pneumatic compression devices for DVT ppx -continue keppra at current dose and schedule  Hospital day # 3  Elspeth Cho, DO Neurology-Stroke    HPI  Review of Systems  Physical Exam

## 2013-02-17 NOTE — Progress Notes (Signed)
Echo Lab  2D Echocardiogram completed.  Joanna Hall L Jaysin Gayler, RDCS 02/17/2013 3:59 PM

## 2013-02-18 ENCOUNTER — Inpatient Hospital Stay (HOSPITAL_COMMUNITY): Payer: Medicare Other

## 2013-02-18 DIAGNOSIS — I6200 Nontraumatic subdural hemorrhage, unspecified: Secondary | ICD-10-CM

## 2013-02-18 DIAGNOSIS — I6350 Cerebral infarction due to unspecified occlusion or stenosis of unspecified cerebral artery: Secondary | ICD-10-CM

## 2013-02-18 LAB — CBC WITH DIFFERENTIAL/PLATELET
Basophils Relative: 0 % (ref 0–1)
Eosinophils Absolute: 0 10*3/uL (ref 0.0–0.7)
Hemoglobin: 12.6 g/dL — ABNORMAL LOW (ref 13.0–17.0)
Lymphs Abs: 2.2 10*3/uL (ref 0.7–4.0)
MCH: 33.2 pg (ref 26.0–34.0)
MCHC: 33.7 g/dL (ref 30.0–36.0)
Monocytes Relative: 9 % (ref 3–12)
Neutro Abs: 5.6 10*3/uL (ref 1.7–7.7)
Neutrophils Relative %: 65 % (ref 43–77)
Platelets: 112 10*3/uL — ABNORMAL LOW (ref 150–400)
RBC: 3.79 MIL/uL — ABNORMAL LOW (ref 4.22–5.81)

## 2013-02-18 LAB — GLUCOSE, CAPILLARY
Glucose-Capillary: 119 mg/dL — ABNORMAL HIGH (ref 70–99)
Glucose-Capillary: 115 mg/dL — ABNORMAL HIGH (ref 70–99)

## 2013-02-18 LAB — BASIC METABOLIC PANEL
Calcium: 9.3 mg/dL (ref 8.4–10.5)
GFR calc Af Amer: 84 mL/min — ABNORMAL LOW (ref >90)
GFR calc non Af Amer: 72 mL/min — ABNORMAL LOW (ref >90)
Glucose, Bld: 115 mg/dL — ABNORMAL HIGH (ref 70–99)
Potassium: 3.4 mEq/L — ABNORMAL LOW (ref 3.5–5.1)
Sodium: 145 mEq/L (ref 135–145)

## 2013-02-18 MED ORDER — LABETALOL HCL 5 MG/ML IV SOLN
10.0000 mg | Freq: Once | INTRAVENOUS | Status: AC
  Administered 2013-02-18: 10 mg via INTRAVENOUS
  Filled 2013-02-18: qty 4

## 2013-02-18 MED ORDER — METOPROLOL TARTRATE 50 MG PO TABS
50.0000 mg | ORAL_TABLET | Freq: Two times a day (BID) | ORAL | Status: DC
  Administered 2013-02-18 – 2013-02-20 (×4): 50 mg via ORAL
  Filled 2013-02-18 (×5): qty 1

## 2013-02-18 NOTE — Progress Notes (Addendum)
Stroke Team Progress Note  HISTORY  Per admission notes:[name] is an 79 y.o. male who was normal night prior to admission. When he woke up  wife noted he was not as talkative as usual but she though nothing of it. She went to take a shower and when she finished he was in the bedroom and not following commands. She noted a right facial droop also. Patient has a home health nurse who cares for her son at home who recommended they call EMS. There was no jerking or tonic clonic movement noted but patient was incontinent. EMS was called and patient was brought to hospital as a code stroke. Initial CT head showed chronic left subdural hemorrhage with superimposed acute or subacute bleed.  Of note: patient is in Afib and has been in Afib since last bleed 06/23/12  Patient was not a TPA candidate secondary to ICH and SDH, outside of window.   SUBJECTIVE Patient alone in bed. Does not follow commands and is disoriented.   OBJECTIVE Most recent Vital Signs: Filed Vitals:   02/17/13 2218 02/17/13 2337 02/18/13 0300 02/18/13 0700  BP: 171/90 170/99 153/86 191/98  Pulse: 115 89 107 96  Temp:  98.8 F (37.1 C) 98.6 F (37 C) 98.6 F (37 C)  TempSrc:  Oral Oral Axillary  Resp:  20 23 25   Height:      Weight:   79.1 kg (174 lb 6.1 oz)   SpO2:  94% 95% 94%   CBG (last 3)   Recent Labs  02/17/13 0800 02/18/13 0753 02/18/13 0806  GLUCAP 93 119* 115*    IV Fluid Intake:   . sodium chloride 50 mL/hr at 02/15/13 2121    MEDICATIONS  . donepezil  5 mg Oral QHS  . finasteride  5 mg Oral QHS  . levETIRAcetam  500 mg Intravenous BID  . levothyroxine  25 mcg Oral Q1200  . metoprolol  25 mg Oral BID  . risperiDONE  1 mg Oral Daily  . simvastatin  20 mg Oral QPM  . tamsulosin  0.4 mg Oral Daily   PRN:  acetaminophen, acetaminophen, hydrALAZINE, hydrALAZINE, HYDROcodone-acetaminophen, ondansetron (ZOFRAN) IV  Diet:  NPO  Activity:  Bedrest DVT Prophylaxis:  Compression  devices  CLINICALLY SIGNIFICANT STUDIES Basic Metabolic Panel:   Recent Labs Lab 02/17/13 0400 02/18/13 0538  NA 144 145  K 3.5 3.4*  CL 107 107  CO2 25 26  GLUCOSE 90 115*  BUN 22 25*  CREATININE 1.01 0.91  CALCIUM 9.3 9.3  MG 1.9 2.0   Liver Function Tests:   Recent Labs Lab 02/16/13 0420 02/17/13 0400  AST 16 16  ALT 9 8  ALKPHOS 53 54  BILITOT 1.7* 2.2*  PROT 5.8* 5.9*  ALBUMIN 2.9* 3.0*   CBC:   Recent Labs Lab 02/14/13 1103 02/14/13 1119 02/18/13 0538  WBC 6.2  --  8.6  NEUTROABS 3.3  --  5.6  HGB 12.1* 11.9* 12.6*  HCT 36.2* 35.0* 37.4*  MCV 98.9  --  98.7  PLT 125*  --  112*   Coagulation:   Recent Labs Lab 02/14/13 1103  LABPROT 14.5  INR 1.15   Cardiac Enzymes:   Recent Labs Lab 02/14/13 1103  TROPONINI <0.30   Urinalysis: No results found for this basename: COLORURINE, APPERANCEUR, LABSPEC, PHURINE, GLUCOSEU, HGBUR, BILIRUBINUR, KETONESUR, PROTEINUR, UROBILINOGEN, NITRITE, LEUKOCYTESUR,  in the last 168 hours Lipid Panel No results found for this basename: chol,  trig,  hdl,  cholhdl,  vldl,  ldlcalc   HgbA1C  No results found for this basename: HGBA1C    Urine Drug Screen:   No results found for this basename: labopia,  cocainscrnur,  labbenz,  amphetmu,  thcu,  labbarb    Alcohol Level: No results found for this basename: ETH,  in the last 168 hours  Ct Head Wo Contrast 02/17/2013  :  1.  Left frontal hemorrhagic infarct is unchanged in extent.  No increase in the hemorrhagic component. 2.  Similar volume left convexity subdural hematoma. 3.  Unchanged rightward shift, 7 mm at the septum pellucidum.      Physical Exam   Mental Status:  Eyes open spontaneously, makes some eye contact, will state name, follow simple commands  Cranial Nerves:   PERRL, tracks flashlight fully horizontal and vertical gaze, no gaze preference/deviation noted, blinks to threat bilat, corneals intact, face grossly symmetric  Motor:  Moves all  extremities against light resistance, LUE more movement than RUE, LE appear symmetric   Sensory: Withdraw to noxious stimuli in RUE, bilat LE, localizes in LUE  Bilat downgoing plantar response  ASSESSMENT [name] is a 79 y.o. male presenting initially with AMS likely 2/2 seizure activity, noted to have change in mental status while in hospital found to have acute L frontal ICH on exam. Based on history of A fib, off anticoagulation, and location would have concern for embolic event as etiology of bleed. Mental status markedly improved compared to prior evaluation yesterday. Head CT stable ICH size. Blood pressure and heart rate starting to increase.  -MRI pending -BP Control: [id number], add IV labetolol X 1, increase beta blocker -2D echo/carotids/hgb a1c, lipids pending -NPO pending BSE by SLP -continue keppra at current dose and schedule  Hospital day # 4  Elspeth Cho, DO Neurology-Stroke    HPI  Review of Systems  Physical Exam

## 2013-02-18 NOTE — Progress Notes (Signed)
Speech Language Pathology Dysphagia Treatment Patient Details Name: [name] MRN: [id number] DOB: 1920-06-06 Today's Date: 02/18/2013 Time: 3086-5784 SLP Time Calculation (min): 40 min  Assessment / Plan / Recommendation Clinical Impression  Pt demonstrated improvements from previous encounter. Alert but unable to follow directions. Showed s/s of aspiration with thin liquids (cough/ throat clear); tolerated small cup sips of nectar thick without overt s/s. Suspect delayed swallow; verbal/ tactile cues helpful. Given current cognitive status and s/s noted, pt at high risk of aspiration of thin liquids but this risk is decreased with nectar thick liquids. Rx nectar-thick and dysphagia 3 diet; full supervision/ cues for small sips and "swallow fast"; meds crushed in puree. Will continue to follow for diet tolerance/ advancement.    Diet Recommendation  Initiate / Change Diet: Dysphagia 3 (mechanical soft);Nectar-thick liquid    SLP Plan Goals updated   Pertinent Vitals/Pain n/a   Swallowing Goals  SLP Swallowing Goals Patient will utilize recommended strategies during swallow to increase swallowing safety with: Maximum assistance Swallow Study Goal #2 - Progress: Progressing toward goal  General Temperature Spikes Noted: No Respiratory Status: Room air Behavior/Cognition: Alert;Doesn't follow directions;Cooperative Oral Cavity - Dentition: Dentures, top;Dentures, bottom Patient Positioning: Upright in bed  Oral Cavity - Oral Hygiene Does patient have any of the following "at risk" factors?: Nutritional status - inadequate Brush patient's teeth BID with toothbrush (using toothpaste with fluoride): Yes Patient is HIGH RISK - Oral Care Protocol followed (see row info): Yes Patient is AT RISK - Oral Care Protocol followed (see row info): Yes   Dysphagia Treatment Treatment focused on: Upgraded PO texture trials;Patient/family/caregiver education Family/Caregiver Educated:  wife Treatment Methods/Modalities: Skilled observation;Differential diagnosis Patient observed directly with PO's: Yes Type of PO's observed: Regular;Thin liquids;Dysphagia 1 (puree) Feeding: Able to feed self;Needs assist Liquids provided via: Cup;Straw Oral Phase Signs & Symptoms: Prolonged oral phase Pharyngeal Phase Signs & Symptoms: Suspected delayed swallow initiation;Multiple swallows;Immediate throat clear;Delayed cough Type of cueing: Verbal;Tactile;Visual Amount of cueing: Maximal   GO     Metro Kung, MA, CCC-SLP 02/18/2013, 3:24 PM

## 2013-02-18 NOTE — Progress Notes (Signed)
Physical Therapy Treatment Patient Details Name: [name] MRN: [id number] DOB: 1919-10-31 Today's Date: 02/18/2013 Time: 5784-6962 PT Time Calculation (min): 10 min  PT Assessment / Plan / Recommendation  History of Present Illness [name] is a 79 y.o. male PMHx dementia, hypothyroidism, A- fib, chronic subdural hematoma, HTN, male who was normal last night. When he woke up this AM wife states they ate breakfast together and then she went to take a shower approximately 0900-0930. When she returns states patient  was in the bedroom and not following commands. She noted a right facial droop also. Patient has a home health nurse who cares for her son at home who recommended they call EMS. There was no jerking or tonic clonic movement noted but patient was incontinent of urine. EMS was called and patient was brought to hospital as a code stroke. Initial CT head showed chronic left subdural hemorrhage with superimposed acute or subacute bleed. It is belived that the pt had a seizure   PT Comments   Patient unable to attend to PT today - very lethargic.  Attempted mobility - unable to awaken patient.  Performed UE/LE passive and AA ROM exercises.  Patient unable to follow commands.  Agree with need for SNF at discharge.  Will decrease frequency to 2x/week.  Continue OOB using lift equipment.  Follow Up Recommendations  SNF;Supervision/Assistance - 24 hour     Does the patient have the potential to tolerate intense rehabilitation     Barriers to Discharge        Equipment Recommendations  Rolling walker with 5" wheels    Recommendations for Other Services    Frequency Min 2X/week   Progress towards PT Goals Progress towards PT goals: Not progressing toward goals - comment (Due to lethargy and inability to attend/follow commands)  Plan Current plan remains appropriate;Frequency needs to be updated    Precautions / Restrictions Precautions Precautions:  Fall Restrictions Weight Bearing Restrictions: No   Pertinent Vitals/Pain     Mobility  Bed Mobility Bed Mobility: Not assessed    Exercises General Exercises - Upper Extremity Shoulder Flexion: PROM;AAROM;Both;5 reps;Supine General Exercises - Lower Extremity Ankle Circles/Pumps: PROM;Both;10 reps;Supine Heel Slides: PROM;AAROM;Both;10 reps;Supine Hip ABduction/ADduction: PROM;AAROM;Both;10 reps;Supine (in hooklying)     PT Goals (current goals can now be found in the care plan section)    Visit Information  Last PT Received On: 02/18/13 Assistance Needed: +2 History of Present Illness: [name] is a 79 y.o. male PMHx dementia, hypothyroidism, A- fib, chronic subdural hematoma, HTN, male who was normal last night. When he woke up this AM wife states they ate breakfast together and then she went to take a shower approximately 0900-0930. When she returns states patient  was in the bedroom and not following commands. She noted a right facial droop also. Patient has a home health nurse who cares for her son at home who recommended they call EMS. There was no jerking or tonic clonic movement noted but patient was incontinent of urine. EMS was called and patient was brought to hospital as a code stroke. Initial CT head showed chronic left subdural hemorrhage with superimposed acute or subacute bleed. It is belived that the pt had a seizure    Subjective Data  Subjective: No vocalizations   Cognition  Cognition Arousal/Alertness: Lethargic Behavior During Therapy: Flat affect Overall Cognitive Status: Impaired/Different from baseline Area of Impairment: Orientation;Attention;Following commands;Safety/judgement;Awareness Orientation Level: Disoriented to;Place;Situation;Time;Person (Unable to answer questions) Current Attention Level:  (No focus -  not attending to PT.  Fidgiting with leads) Following Commands:  (Not following any commands) General Comments: Patient very  lethargic.  Did not attend to PT during session.  Unable to follow commands or engage with PT.  Fidgiting with telemetry leads with bil. hands.    Balance     End of Session PT - End of Session Activity Tolerance: Patient limited by lethargy Patient left: in bed;with call bell/phone within reach;with bed alarm set Nurse Communication:  (decreased participation)   GP     Vena Austria 02/18/2013, 5:21 PM Durenda Hurt. Renaldo Fiddler, [facility] Acute Rehab Services Pager [phone]

## 2013-02-18 NOTE — Progress Notes (Signed)
The patient is in the scanner on rounds this morning.  Vital signs and clinical data reviewed. Ventricular response rate is controlled. Blood pressure significantly elevated.  Agree is not a candidate for anticoagulation. Aggressive blood pressure control may be hazardous given increased intracranial pressure.  No specific changes from cardiac standpoint.

## 2013-02-18 NOTE — Progress Notes (Signed)
VASCULAR LAB PRELIMINARY  PRELIMINARY  PRELIMINARY  PRELIMINARY  Carotid duplex completed.    Preliminary report:  Bilateral:  1-39% ICA stenosis.  Vertebral artery flow is antegrade.     [name], RVS 02/18/2013, 4:28 PM

## 2013-02-18 NOTE — Progress Notes (Signed)
Subjective: Patient is pleasant, no apparent distress note reviewed.  Objective: Vital signs in last 24 hours: Temp:  [97.9 F (36.6 C)-99 F (37.2 C)] 98.6 F (37 C) (09/01 0700) Pulse Rate:  [75-115] 96 (09/01 0700) Resp:  [20-25] 25 (09/01 0700) BP: (146-191)/(75-117) 191/98 mmHg (09/01 0700) SpO2:  [94 %-96 %] 94 % (09/01 0700) Weight:  [79.1 kg (174 lb 6.1 oz)] 79.1 kg (174 lb 6.1 oz) (09/01 0300) Weight change: -4 kg (-8 lb 13.1 oz) Last BM Date: 02/15/13  Intake/Output from previous day: 08/31 0701 - 09/01 0700 In: 1300 [I.V.:1200; IV Piggyback:100] Out: 1300 [Urine:1300] Intake/Output this shift: Total I/O In: 50 [I.V.:50] Out: 200 [Urine:200]  General appearance: Pleasant, no apparent distress Resp: clear to auscultation bilaterally Cardio: irregularly irregular rhythm Extremities: extremities normal, atraumatic, no cyanosis or edema Neuro patient follows simple commands moves all extremities  Lab Results:  Results for orders placed during the hospital encounter of 02/14/13 (from the past 24 hour(s))  MAGNESIUM     Status: None   Collection Time    02/18/13  5:38 AM      Result Value Range   Magnesium 2.0  1.5 - 2.5 mg/dL  BASIC METABOLIC PANEL     Status: Abnormal   Collection Time    02/18/13  5:38 AM      Result Value Range   Sodium 145  135 - 145 mEq/L   Potassium 3.4 (*) 3.5 - 5.1 mEq/L   Chloride 107  96 - 112 mEq/L   CO2 26  19 - 32 mEq/L   Glucose, Bld 115 (*) 70 - 99 mg/dL   BUN 25 (*) 6 - 23 mg/dL   Creatinine, Ser 1.61  0.50 - 1.35 mg/dL   Calcium 9.3  8.4 - 09.6 mg/dL   GFR calc non Af Amer 72 (*) >90 mL/min   GFR calc Af Amer 84 (*) >90 mL/min  CBC WITH DIFFERENTIAL     Status: Abnormal   Collection Time    02/18/13  5:38 AM      Result Value Range   WBC 8.6  4.0 - 10.5 K/uL   RBC 3.79 (*) 4.22 - 5.81 MIL/uL   Hemoglobin 12.6 (*) 13.0 - 17.0 g/dL   HCT 04.5 (*) 40.9 - 81.1 %   MCV 98.7  78.0 - 100.0 fL   MCH 33.2  26.0 - 34.0 pg    MCHC 33.7  30.0 - 36.0 g/dL   RDW 91.4  78.2 - 95.6 %   Platelets 112 (*) 150 - 400 K/uL   Neutrophils Relative % 65  43 - 77 %   Neutro Abs 5.6  1.7 - 7.7 K/uL   Lymphocytes Relative 26  12 - 46 %   Lymphs Abs 2.2  0.7 - 4.0 K/uL   Monocytes Relative 9  3 - 12 %   Monocytes Absolute 0.7  0.1 - 1.0 K/uL   Eosinophils Relative 0  0 - 5 %   Eosinophils Absolute 0.0  0.0 - 0.7 K/uL   Basophils Relative 0  0 - 1 %   Basophils Absolute 0.0  0.0 - 0.1 K/uL  GLUCOSE, CAPILLARY     Status: Abnormal   Collection Time    02/18/13  7:53 AM      Result Value Range   Glucose-Capillary 119 (*) 70 - 99 mg/dL  GLUCOSE, CAPILLARY     Status: Abnormal   Collection Time    02/18/13  8:06 AM  Result Value Range   Glucose-Capillary 115 (*) 70 - 99 mg/dL   Comment 1 Documented in Chart     Comment 2 Notify RN        Studies/Results: Ct Head Wo Contrast  02/17/2013   *RADIOLOGY REPORT*  Clinical Data: Follow-up subdural hematoma.  CT HEAD WITHOUT CONTRAST  Technique:  Contiguous axial images were obtained from the base of the skull through the vertex without contrast.  Comparison: Head CT from yesterday.  Findings:  No acute osseous findings.  No new soft tissue abnormalities. Bilateral cataract resection.  Chronic proptosis with increased intra orbital fat.  No increase in left convexity subdural hemorrhage, measuring up to 1.7 cm in perpendicular dimension.  A hemorrhagic infarct in the lateral left frontal lobe, involving the frontal operculum, anterior insula, and high left frontal lobe is unchanged in extent.  The multi lobulated hematoma is also not changed, with the largest component measuring up to 3 cm diameter.  Rightward midline shift is stable when measured at the same level, 7mm at the septum pellucidum.  No evidence of acute ischemia.  No hydrocephalus.  Chronic small vessel ischemic changes.  Global cerebral atrophy. Extensive intracranial atherosclerosis.  IMPRESSION:  1.  Left frontal  hemorrhagic infarct is unchanged in extent.  No increase in the hemorrhagic component. 2.  Similar volume left convexity subdural hematoma. 3.  Unchanged rightward shift, 7 mm at the septum pellucidum.   Original Report Authenticated By: Tiburcio Pea    Medications:  Scheduled: . donepezil  5 mg Oral QHS  . finasteride  5 mg Oral QHS  . labetalol  10 mg Intravenous Once  . levETIRAcetam  500 mg Intravenous BID  . levothyroxine  25 mcg Oral Q1200  . metoprolol  50 mg Oral BID  . risperiDONE  1 mg Oral Daily  . simvastatin  20 mg Oral QPM  . tamsulosin  0.4 mg Oral Daily   Continuous: . sodium chloride 50 mL/hr at 02/15/13 2121    Assessment/Plan: Mental status change in the elderly male with underlying dementia with chronic left subdural, now with new left frontal hemorrhagic infarct. Continue management per neurology/neurosurgery. Await followup speech evaluation  Atrial fibrillation rate controlled, no plans for anticoagulation  Hypertension BP still greater than optimal meds have been titrated  DO NOT RESUSCITATE  n LOS: 4 days   Ruslan Mccabe D 02/18/2013, 10:26 AM

## 2013-02-18 NOTE — Progress Notes (Signed)
Clinical Social Work Department BRIEF PSYCHOSOCIAL ASSESSMENT 02/18/2013  Patient:  [name], [name]     Account Number:  [id number]     Admit date:  02/14/2013  Clinical Social Worker:  Lourdes Sledge  Date/Time:  02/18/2013 02:16 PM  Referred by:  Physician  Date Referred:  02/18/2013 Referred for  SNF Placement   Other Referral:   Interview type:  Family Other interview type:   CSW completed assessment with pt wife Ivete in pt room.    PSYCHOSOCIAL DATA Living Status:  WIFE Admitted from facility:   Level of care:   Primary support name:  Zarion Oliff [phone] Primary support relationship to patient:  SPOUSE Degree of support available:   Pt spouse states she is primary care taker of pt however she is also care taker of her son who is also completely dependent upon her care.    CURRENT CONCERNS Current Concerns  Post-Acute Placement   Other Concerns:    SOCIAL WORK ASSESSMENT / PLAN Assisting CSW informed that PT is recommending SNF placement at dc.    CSW reviewed pt notes and observed that pt is currently disoriented. CSW visited pt room and met with met pt wife [name] [phone] and informed wife of PT recommendations. CSW explored amount of support pt has at home and whether she would be agreeable to SNF placement. Pt wife stated that she is her sons caretaker who is dependent upon her care. Pt wife stated that she would be agreeable to ST SNF for pt preferrably near [place]. Wife stated she would also consider placement in [facility]. Wife has given CSW consent to do a SNF search.   Assessment/plan status:  Psychosocial Support/Ongoing Assessment of Needs Other assessment/ plan:   Information/referral to community resources:   SNF list and CSW contact information provided to pt spouse.    PATIENT'S/FAMILY'S RESPONSE TO PLAN OF CARE: Pt resting at bedside, presenting disoriented. CSW completed assessment with pt spouse [name] [phone] who was present in pt room.    Unit CSW to follow up with bed offers.       [name], MSW, LCSW [phone]

## 2013-02-18 NOTE — Progress Notes (Addendum)
Clinical Social Work Department CLINICAL SOCIAL WORK PLACEMENT NOTE 02/18/2013  Patient:  [name], [name]  Account Number:  [id number] Admit date:  02/14/2013  Clinical Social Worker:  Theresia Bough, Theresia Majors  Date/time:  02/18/2013 02:27 PM  Clinical Social Work is seeking post-discharge placement for this patient at the following level of care:   SKILLED NURSING   (*CSW will update this form in Epic as items are completed)   02/18/2013  Patient/family provided with [facility] Department of Clinical Social Work's list of facilities offering this level of care within the geographic area requested by the patient (or if unable, by the patient's family).  02/18/2013  Patient/family informed of their freedom to choose among providers that offer the needed level of care, that participate in Medicare, Medicaid or managed care program needed by the patient, have an available bed and are willing to accept the patient.  02/18/2013  Patient/family informed of [facility]' ownership interest in [facility], as well as of the fact that they are under no obligation to receive care at this facility.  PASARR submitted to EDS on 02/18/2013 PASARR number received from EDS on 02/18/13  FL2 transmitted to all facilities in geographic area requested by pt/family on  02/18/2013 FL2 transmitted to all facilities within larger geographic area on   Patient informed that his/her managed care company has contracts with or will negotiate with  certain facilities, including the following:     Patient/family informed of bed offers received:   Patient chooses bed at  Physician recommends and patient chooses bed at    Patient to be transferred to  on   Patient to be transferred to facility by   The following physician request were entered in Epic:   Additional Comments:  Theresia Bough, MSW, LCSW [phone]

## 2013-02-19 LAB — LIPID PANEL
HDL: 53 mg/dL (ref >39)
LDL Cholesterol: 83 mg/dL (ref 0–99)
Total CHOL/HDL Ratio: 2.8 RATIO
Triglycerides: 60 mg/dL (ref <150)
VLDL: 12 mg/dL (ref 0–40)

## 2013-02-19 LAB — GLUCOSE, CAPILLARY: Glucose-Capillary: 109 mg/dL — ABNORMAL HIGH (ref 70–99)

## 2013-02-19 MED ORDER — FUROSEMIDE 20 MG PO TABS: 20.0000 mg | ORAL_TABLET | ORAL | Status: DC

## 2013-02-19 MED ORDER — DOXAZOSIN MESYLATE 2 MG PO TABS: 2.0000 mg | ORAL_TABLET | Freq: Every day | ORAL | Status: DC

## 2013-02-19 MED ORDER — ACETAMINOPHEN 325 MG PO TABS: 650.0000 mg | ORAL_TABLET | Freq: Four times a day (QID) | ORAL | Status: DC | PRN

## 2013-02-19 MED ORDER — LEVETIRACETAM 500 MG PO TABS
500.0000 mg | ORAL_TABLET | Freq: Two times a day (BID) | ORAL | Status: DC
  Administered 2013-02-19 – 2013-02-20 (×3): 500 mg via ORAL
  Filled 2013-02-19 (×6): qty 1

## 2013-02-19 MED ORDER — LEVETIRACETAM 500 MG PO TABS: 500.0000 mg | ORAL_TABLET | Freq: Two times a day (BID) | ORAL | Status: DC

## 2013-02-19 MED ORDER — LEVOTHYROXINE SODIUM 25 MCG PO TABS: 25.0000 ug | ORAL_TABLET | Freq: Every day | ORAL | Status: DC

## 2013-02-19 MED ORDER — DONEPEZIL HCL 5 MG PO TABS: 5.0000 mg | ORAL_TABLET | Freq: Every day | ORAL | Status: DC

## 2013-02-19 MED ORDER — DOXAZOSIN MESYLATE 2 MG PO TABS
2.0000 mg | ORAL_TABLET | Freq: Every day | ORAL | Status: DC
  Administered 2013-02-19: 2 mg via ORAL
  Filled 2013-02-19 (×2): qty 1

## 2013-02-19 MED ORDER — STARCH (THICKENING) PO POWD: ORAL | Status: DC

## 2013-02-19 MED ORDER — RISPERIDONE 1 MG PO TBDP: 1.0000 mg | ORAL_TABLET | Freq: Every day | ORAL | Status: DC

## 2013-02-19 MED ORDER — STARCH (THICKENING) PO POWD
ORAL | Status: DC | PRN
  Filled 2013-02-19: qty 227

## 2013-02-19 NOTE — Progress Notes (Signed)
Subjective: Pt more awake and alert Less confused  Objective: Vital signs in last 24 hours: Temp:  [98 F (36.7 C)-99.2 F (37.3 C)] 98.2 F (36.8 C) (09/02 0419) Pulse Rate:  [83-99] 86 (09/02 0419) Resp:  [17-24] 17 (09/02 0419) BP: (155-176)/(91-112) 160/112 mmHg (09/02 0419) SpO2:  [93 %-96 %] 93 % (09/02 0419) Weight:  [79.2 kg (174 lb 9.7 oz)] 79.2 kg (174 lb 9.7 oz) (09/02 0419) Weight change: 0.1 kg (3.5 oz) Last BM Date: 02/15/13  Intake/Output from previous day: 09/01 0701 - 09/02 0700 In: 659 [I.V.:550; IV Piggyback:109] Out: 1175 [Urine:1175] Intake/Output this shift:     Resp: clear to auscultation bilaterally Cardio: irregularly irregular rhythm Neurologic: Mental status: alert, with dysarthria  Lab Results:  Recent Labs  02/18/13 0538  WBC 8.6  HGB 12.6*  HCT 37.4*  PLT 112*   BMET  Recent Labs  02/17/13 0400 02/18/13 0538  NA 144 145  K 3.5 3.4*  CL 107 107  CO2 25 26  GLUCOSE 90 115*  BUN 22 25*  CREATININE 1.01 0.91  CALCIUM 9.3 9.3    Studies/Results: [facility] Contrast  02/18/2013   *RADIOLOGY REPORT*  Clinical Data:  Hemorrhagic stroke.  Subdural hematoma  MRI HEAD WITHOUT CONTRAST MRA HEAD WITHOUT CONTRAST  Technique:  Multiplanar, multiecho pulse sequences of the brain and surrounding structures were obtained without intravenous contrast. Angiographic images of the head were obtained using MRA technique without contrast.  Comparison:  CT 02/17/2013  MRI HEAD  Findings:  Acute hemorrhagic infarct in the left frontal temporal lobe involving the insula.  This is similar to the CT yesterday. In addition, there is a small area of acute non hemorrhagic infarct in the left parietal lobe.  Mixed signal subdural hematoma on the left is similar to the CT. This has some layering blood products dependently and hyperintense fluid throughout most of the collection.  This measures approximately 18 mm in thickness, unchanged.  9 mm midline shift to  the right is unchanged.  Negative for hydrocephalus.  Generalized atrophy is present.  No underlying mass lesion is identified on unenhanced imaging.  IMPRESSION: Hemorrhagic acute infarct in the right frontal temporal lobe.  This is unchanged from the CT yesterday.  Acute and chronic subdural hematoma measuring up to 18 mm in thickness.  Unchanged.  9 mm midline shift to the right is unchanged.  Small  acute infarct in the left parietal lobe.  MRA HEAD  Findings: Both vertebral arteries are widely patent.  The basilar is widely patent.  Superior cerebellar and posterior cerebral arteries are patent.  The internal carotid artery is widely patent bilaterally.  Mild stenosis on the left due to atherosclerotic disease.  The anterior and middle cerebral arteries are widely patent bilaterally without significant stenosis.  Negative for cerebral aneurysm.  IMPRESSION:  Mild atherosclerotic disease in the cavernous carotid.  No large vessel occlusion or significant stenosis.   Original Report Authenticated By: Janeece Riggers, M.D.   Mr Brain Wo Contrast  02/18/2013   *RADIOLOGY REPORT*  Clinical Data:  Hemorrhagic stroke.  Subdural hematoma  MRI HEAD WITHOUT CONTRAST MRA HEAD WITHOUT CONTRAST  Technique:  Multiplanar, multiecho pulse sequences of the brain and surrounding structures were obtained without intravenous contrast. Angiographic images of the head were obtained using MRA technique without contrast.  Comparison:  CT 02/17/2013  MRI HEAD  Findings:  Acute hemorrhagic infarct in the left frontal temporal lobe involving the insula.  This is similar to  the CT yesterday. In addition, there is a small area of acute non hemorrhagic infarct in the left parietal lobe.  Mixed signal subdural hematoma on the left is similar to the CT. This has some layering blood products dependently and hyperintense fluid throughout most of the collection.  This measures approximately 18 mm in thickness, unchanged.  9 mm midline shift to the  right is unchanged.  Negative for hydrocephalus.  Generalized atrophy is present.  No underlying mass lesion is identified on unenhanced imaging.  IMPRESSION: Hemorrhagic acute infarct in the right frontal temporal lobe.  This is unchanged from the CT yesterday.  Acute and chronic subdural hematoma measuring up to 18 mm in thickness.  Unchanged.  9 mm midline shift to the right is unchanged.  Small  acute infarct in the left parietal lobe.  MRA HEAD  Findings: Both vertebral arteries are widely patent.  The basilar is widely patent.  Superior cerebellar and posterior cerebral arteries are patent.  The internal carotid artery is widely patent bilaterally.  Mild stenosis on the left due to atherosclerotic disease.  The anterior and middle cerebral arteries are widely patent bilaterally without significant stenosis.  Negative for cerebral aneurysm.  IMPRESSION:  Mild atherosclerotic disease in the cavernous carotid.  No large vessel occlusion or significant stenosis.   Original Report Authenticated By: Janeece Riggers, M.D.    Medications: I have reviewed the patient's current medications.  Assessment/Plan: MS changes: acute R hemorrhagic frontal lobe infract; Acute on chronic SDH- stable with repeat CT/MRI Carotid - ok, MRI/MRA ok- w/u complete. No ASA/ or anticoagulation Seizure- change to po Keppra Afib rate control on BB HTN- keep BP  below 160- restart Cardura- also for BPH Dementia/ Delirium with some sundowning- continue Aricept; and respiradone BPH- continue same med Dysphagia- D3 diet Hypothyroid- continue synthroid- TSH- ok SNF - ok to d/c on bed available/ PT/ PO/ Speech  DNR  LOS: 5 days   Treavor Blomquist 02/19/2013, 7:37 AM

## 2013-02-19 NOTE — Progress Notes (Signed)
Utilization review completed.  

## 2013-02-19 NOTE — Progress Notes (Signed)
Speech Language Pathology Dysphagia Treatment Patient Details Name: [name] MRN: [id number] DOB: Apr 06, 1920 Today's Date: 02/19/2013 Time: 1400-1440 SLP Time Calculation (min): 40 min  Assessment / Plan / Recommendation Clinical Impression  Pt. lethargic, with lunch tray at bedside untouched.  RN reports pt. will not awaken sufficiently to eat.  With max stimuli with cold cloth to face, pt. awoke briefly and accepted meds crushed in applesauce via RN, and sips of nectar thick liquid.  Pt. holds each bolus orally, and requires max verbal and tactile cues to swallow.  Nsg. reports pt. ate well (100%) at breakfast, but has his "days and nights mixed up."  No overt s/s of aspiration with purees and nectar. Rec. feeding pt. only when alert (attempt to arouse pt.).    Diet Recommendation  Continue with Current Diet: Dysphagia 3 (mechanical soft);Nectar-thick liquid    SLP Plan Continue with current plan of care   Pertinent Vitals/Pain n/a   Swallowing Goals     General Temperature Spikes Noted: No Respiratory Status: Room air Behavior/Cognition: Lethargic;Distractible;Requires cueing;Decreased sustained attention;Doesn't follow directions Oral Cavity - Dentition: Dentures, top;Dentures, bottom Patient Positioning: Upright in bed  Oral Cavity - Oral Hygiene Does patient have any of the following "at risk" factors?: Nutritional status - inadequate;Other - dysphagia Brush patient's teeth BID with toothbrush (using toothpaste with fluoride): Yes Patient is HIGH RISK - Oral Care Protocol followed (see row info): Yes Patient is AT RISK - Oral Care Protocol followed (see row info): Yes   Dysphagia Treatment Treatment focused on: Skilled observation of diet tolerance;Patient/family/caregiver education Family/Caregiver Educated: wife Treatment Methods/Modalities: Skilled observation;Differential diagnosis Patient observed directly with PO's: Yes Type of PO's observed: Nectar-thick  liquids;Dysphagia 1 (puree) (with crushed meds) Feeding: Total assist Liquids provided via: Teaspoon;Cup;No straw Oral Phase Signs & Symptoms: Prolonged oral phase Pharyngeal Phase Signs & Symptoms: Suspected delayed swallow initiation Type of cueing: Verbal;Tactile Amount of cueing: Maximal   GO     Maryjo Rochester T 02/19/2013, 2:40 PM

## 2013-02-19 NOTE — Progress Notes (Signed)
SNF bed offered at [facility] which is the patient/family preference-  Advised them of possible d/c tomorrow.   Reece Levy, MSW [phone]

## 2013-02-19 NOTE — Progress Notes (Addendum)
Stroke Team Progress Note  HISTORY Per admission notes:[name] is an 79 y.o. male who was normal night prior to admission - ARRIVED 02/14/2013. When he woke up wife noted he was not as talkative as usual but she though nothing of it. She went to take a shower and when she finished he was in the bedroom and not following commands. She noted a right facial droop also. Patient has a home health nurse who cares for her son at home who recommended they call EMS. There was no jerking or tonic clonic movement noted but patient was incontinent. EMS was called and patient was brought to hospital as a code stroke. Initial CT head showed chronic left subdural hemorrhage with superimposed acute or subacute bleed. Of note: patient is in Afib and has been in Afib since last bleed 06/23/12 Patient was not a TPA candidate secondary to ICH and SDH, outside of window.   SUBJECTIVE   OBJECTIVE Most recent Vital Signs: Filed Vitals:   02/18/13 2333 02/19/13 0419 02/19/13 0800 02/19/13 0929  BP: 166/109 160/112 182/100   Pulse: 89 86 86 75  Temp: 99.1 F (37.3 C) 98.2 F (36.8 C) 97.8 F (36.6 C)   TempSrc: Oral Oral Oral   Resp: 24 17 19    Height:      Weight:  79.2 kg (174 lb 9.7 oz)    SpO2: 95% 93% 93%    CBG (last 3)   Recent Labs  02/18/13 0753 02/18/13 0806 02/19/13 0829  GLUCAP 119* 115* 109*    IV Fluid Intake:     MEDICATIONS  . donepezil  5 mg Oral QHS  . doxazosin  2 mg Oral QHS  . finasteride  5 mg Oral QHS  . levETIRAcetam  500 mg Oral BID  . levothyroxine  25 mcg Oral Q1200  . metoprolol  50 mg Oral BID  . risperiDONE  1 mg Oral Daily  . simvastatin  20 mg Oral QPM  . tamsulosin  0.4 mg Oral Daily   PRN:  acetaminophen, acetaminophen, hydrALAZINE, hydrALAZINE, HYDROcodone-acetaminophen, ondansetron (ZOFRAN) IV  Diet:  Dysphagia 3 nectar thick liquids Activity:  Bedrest DVT Prophylaxis:  SCDs   CLINICALLY SIGNIFICANT STUDIES Basic Metabolic Panel:  Recent  Labs Lab 02/17/13 0400 02/18/13 0538 02/19/13 0410  NA 144 145  --   K 3.5 3.4*  --   CL 107 107  --   CO2 25 26  --   GLUCOSE 90 115*  --   BUN 22 25*  --   CREATININE 1.01 0.91  --   CALCIUM 9.3 9.3  --   MG 1.9 2.0 1.9   Liver Function Tests:  Recent Labs Lab 02/16/13 0420 02/17/13 0400  AST 16 16  ALT 9 8  ALKPHOS 53 54  BILITOT 1.7* 2.2*  PROT 5.8* 5.9*  ALBUMIN 2.9* 3.0*   CBC:  Recent Labs Lab 02/14/13 1103 02/14/13 1119 02/18/13 0538  WBC 6.2  --  8.6  NEUTROABS 3.3  --  5.6  HGB 12.1* 11.9* 12.6*  HCT 36.2* 35.0* 37.4*  MCV 98.9  --  98.7  PLT 125*  --  112*   Coagulation:  Recent Labs Lab 02/14/13 1103  LABPROT 14.5  INR 1.15   Cardiac Enzymes:  Recent Labs Lab 02/14/13 1103  TROPONINI <0.30   Urinalysis: No results found for this basename: COLORURINE, APPERANCEUR, LABSPEC, PHURINE, GLUCOSEU, HGBUR, BILIRUBINUR, KETONESUR, PROTEINUR, UROBILINOGEN, NITRITE, LEUKOCYTESUR,  in the last 168 hours Lipid Panel  Component Value Date/Time   CHOL 148 02/19/2013 0410   TRIG 60 02/19/2013 0410   HDL 53 02/19/2013 0410   CHOLHDL 2.8 02/19/2013 0410   VLDL 12 02/19/2013 0410   LDLCALC 83 02/19/2013 0410   HgbA1C  No results found for this basename: HGBA1C    Urine Drug Screen:   No results found for this basename: labopia, cocainscrnur, labbenz, amphetmu, thcu, labbarb    Alcohol Level: No results found for this basename: ETH,  in the last 168 hours  CT of the brain  02/14/2013    Chronic left subdural hemorrhage with superimposed acute or subacute bleed.  Associated 0.5 cm of left to right midline shift is identified.    MRI of the brain  02/18/2013  Hemorrhagic acute infarct in the right frontal temporal lobe.  This is unchanged from the CT yesterday.  Acute and chronic subdural hematoma measuring up to 18 mm in thickness.  Unchanged.  9 mm midline shift to the right is unchanged.  Small  acute infarct in the left parietal lobe.   MRA of the brain   02/18/2013    Mild atherosclerotic disease in the cavernous carotid.  No large vessel occlusion or significant stenosis.    2D Echocardiogram  02/17/2013 EF 55-60% with no source of embolus.   Carotid Doppler  02/18/2013 Bilateral: 1-39% ICA stenosis. Vertebral artery flow is antegrade.   CXR    EKG  atrial fibrillation, rate 71.   Therapy Recommendations SNF  Physical Exam   Mental Status:  Eyes open spontaneously, makes some eye contact, will state name, follow simple commands, tracks light, locks gaze on son when asked.  Cranial Nerves:  II: PERRL, tracks flashlight fully horizontal and vertical gaze, no gaze preference/deviation noted, blinks to threat bilat, corneals intact, face grossly symmetric  Motor:  Moves all extremities against light resistance, LUE more movement than RUE, LE appear symmetric  Sensory:  Withdraw to noxious stimuli in RUE, bilat LE, localizes in LUE  Bilat downgoing plantar response  ASSESSMENT [name] is a 79 y.o. male presenting initially with AMS likely secondary to seizure activity, noted to have change in mental status while in hospital found to have  acute infarct right frontal temporal lobe with hemorrhagic transformation and mild cytotoxic edema . Patient also with acute and chronic subdural hematoma with midline shift. Also small  left parietal lobe infarct. Infarcts felt to be embolic given hx A fib, off anticoagulation, and location would have concern for embolic event as etiology of bleed.   Hypertension atrial fibrillation Baseline dementia Moderate aortic stenosis  Hospital day # 5  TREATMENT/PLAN  Continue to hold anticoagulation for secondary stroke prevention given hemorrhage(s). Recommend aspirin 81 mg daily when ok with neurosurgery.  Continue keppra  PT recommends SNF at discharge No further stroke workup indicated. Patient has a 10-15% risk of having another stroke over the next year, the highest risk is within 2  weeks of the most recent stroke/TIA (risk of having a stroke following a stroke or TIA is the same). Ongoing risk factor control by Primary Care Physician Stroke Service will sign off. Please call should any needs arise. Follow up with Dr. Pearlean Brownie, Stroke Clinic, in 2 months.  Annie Main, MSN, RN, ANVP-BC, ANP-BC, [facility] Pager: [phone] 02/19/2013 9:53 AM  I have personally obtained a history, examined the patient, evaluated imaging results, and formulated the assessment and plan of care. I agree with the above. Delia Heady, MD

## 2013-02-19 NOTE — Progress Notes (Signed)
Occupational Therapy Treatment Patient Details Name: [name] MRN: [id number] DOB: 08-Jun-1920 Today's Date: 02/19/2013 Time: 4098-1191 OT Time Calculation (min): 24 min  OT Assessment / Plan / Recommendation  History of present illness [name] is a 79 y.o. male PMHx dementia, hypothyroidism, A- fib, chronic subdural hematoma, HTN, male who was normal last night. When he woke up this AM wife states they ate breakfast together and then she went to take a shower approximately 0900-0930. When she returns states patient  was in the bedroom and not following commands. She noted a right facial droop also. Patient has a home health nurse who cares for her son at home who recommended they call EMS. There was no jerking or tonic clonic movement noted but patient was incontinent of urine. EMS was called and patient was brought to hospital as a code stroke. Initial CT head showed chronic left subdural hemorrhage with acute hemorrhagic infarct in the left frontal temporal lobe involving the insula . It is belived that the pt had a seizure   OT comments  Pt not progressing today due to increased lethargy today over the day I evaluated him  Follow Up Recommendations  Supervision/Assistance - 24 hour       Equipment Recommendations  None recommended by OT       Frequency Min 2X/week   Progress towards OT Goals Progress towards OT goals: Not progressing toward goals - comment (more lethargic today)  Plan Discharge plan remains appropriate    Precautions / Restrictions Precautions Precautions: Fall Restrictions Weight Bearing Restrictions: No       ADL  Toilet Transfer: Simulated;+2 Total assistance Toilet Transfer: Patient Percentage: 30% Toilet Transfer Method: Squat pivot (to right) Acupuncturist:  (bed to recliner) Equipment Used: Gait belt Transfers/Ambulation Related to ADLs: total A +2 (pt=30%) partial sit to stand and EOB x2; (10%) stand to sit ADL Comments:  Pt not responding verbally and tending to keep eyes closed      OT Goals(current goals can now be found in the care plan section)    Visit Information  Last OT Received On: 02/19/13 Assistance Needed: +2 History of Present Illness: [name] is a 79 y.o. male PMHx dementia, hypothyroidism, A- fib, chronic subdural hematoma, HTN, male who was normal last night. When he woke up this AM wife states they ate breakfast together and then she went to take a shower approximately 0900-0930. When she returns states patient  was in the bedroom and not following commands. She noted a right facial droop also. Patient has a home health nurse who cares for her son at home who recommended they call EMS. There was no jerking or tonic clonic movement noted but patient was incontinent of urine. EMS was called and patient was brought to hospital as a code stroke. Initial CT head showed chronic left subdural hemorrhage with acute hemorrhagic infarct in the left frontal temporal lobe involving the insula . It is belived that the pt had a seizure          Cognition  Cognition Arousal/Alertness: Lethargic Behavior During Therapy: Flat affect Overall Cognitive Status: Impaired/Different from baseline Current Attention Level: Focused Following Commands: Follows one step commands inconsistently Safety/Judgement: Decreased awareness of safety (trying to stand before we were ready) Problem Solving: Requires verbal cues;Requires tactile cues General Comments: Pt spontaneously adjusting his gown in front and back while seated EOB    Mobility  Bed Mobility Bed Mobility: Rolling Left;Left Sidelying to Sit Rolling Left:  2: Max assist Left Sidelying to Sit: 1: +2 Total assist Left Sidelying to Sit: Patient Percentage: 10% Sitting - Scoot to Edge of Bed: 1: +2 Total assist Sitting - Scoot to Edge of Bed: Patient Percentage: 0% Transfers Transfers: Sit to Stand;Stand to Sit Sit to Stand: 1: +2 Total  assist;Without upper extremity assist;From bed Sit to Stand: Patient Percentage: 30% Stand to Sit: 1: +2 Total assist;Without upper extremity assist;To chair/3-in-1 Stand to Sit: Patient Percentage: 10% Details for Transfer Assistance: partial stand x3 to turn to recliner, decreased control of descent       Balance Balance Balance Assessed: Yes Static Sitting Balance Static Sitting - Balance Support: No upper extremity supported;Feet supported Static Sitting - Level of Assistance:  (minguard a with tendency to lean to the right) Static Sitting - Comment/# of Minutes: 10 minutes   End of Session OT - End of Session Equipment Utilized During Treatment: Gait belt Activity Tolerance: Patient limited by fatigue;Patient limited by lethargy Patient left: in chair;with chair alarm set;with family/visitor present (Bil Mitts left off due to family in room) Nurse Communication: Mobility status (+2)       Evette Georges [phone] 02/19/2013, 1:43 PM

## 2013-02-19 NOTE — Progress Notes (Signed)
Subjective:  Dr. Donette Larry note reviewed. Agree, more alert. No complaints  Objective:  Vital Signs in the last 24 hours: Temp:  [97.8 F (36.6 C)-99.2 F (37.3 C)] 97.8 F (36.6 C) (09/02 0800) Pulse Rate:  [83-99] 86 (09/02 0800) Resp:  [17-24] 19 (09/02 0800) BP: (155-182)/(91-112) 182/100 mmHg (09/02 0800) SpO2:  [93 %-96 %] 93 % (09/02 0800) Weight:  [79.2 kg (174 lb 9.7 oz)] 79.2 kg (174 lb 9.7 oz) (09/02 0419)  Intake/Output from previous day: 09/01 0701 - 09/02 0700 In: 659 [I.V.:550; IV Piggyback:109] Out: 1175 [Urine:1175]   Physical Exam: General: Well developed, well nourished, in no acute distress. Head:  Normocephalic and atraumatic. Lungs: Clear to auscultation and percussion. Heart: Irreg irreg, normal rate.   2/6 SM RUSB,no rubs or gallops.  Abdomen: soft, non-tender, positive bowel sounds. Extremities: No clubbing or cyanosis. No edema. Neurologic: MAE, mittins, more alert    Lab Results:  Recent Labs  02/18/13 0538  WBC 8.6  HGB 12.6*  PLT 112*    Recent Labs  02/17/13 0400 02/18/13 0538  NA 144 145  K 3.5 3.4*  CL 107 107  CO2 25 26  GLUCOSE 90 115*  BUN 22 25*  CREATININE 1.01 0.91    Recent Labs  02/17/13 0400  PROT 5.9*  ALBUMIN 3.0*  AST 16  ALT 8  ALKPHOS 54  BILITOT 2.2*    Recent Labs  02/19/13 0410  CHOL 148    Imaging: Mr Maxine Glenn Head Wo Contrast  02/18/2013   *RADIOLOGY REPORT*  Clinical Data:  Hemorrhagic stroke.  Subdural hematoma  MRI HEAD WITHOUT CONTRAST MRA HEAD WITHOUT CONTRAST  Technique:  Multiplanar, multiecho pulse sequences of the brain and surrounding structures were obtained without intravenous contrast. Angiographic images of the head were obtained using MRA technique without contrast.  Comparison:  CT 02/17/2013  MRI HEAD  Findings:  Acute hemorrhagic infarct in the left frontal temporal lobe involving the insula.  This is similar to the CT yesterday. In addition, there is a small area of acute non  hemorrhagic infarct in the left parietal lobe.  Mixed signal subdural hematoma on the left is similar to the CT. This has some layering blood products dependently and hyperintense fluid throughout most of the collection.  This measures approximately 18 mm in thickness, unchanged.  9 mm midline shift to the right is unchanged.  Negative for hydrocephalus.  Generalized atrophy is present.  No underlying mass lesion is identified on unenhanced imaging.  IMPRESSION: Hemorrhagic acute infarct in the right frontal temporal lobe.  This is unchanged from the CT yesterday.  Acute and chronic subdural hematoma measuring up to 18 mm in thickness.  Unchanged.  9 mm midline shift to the right is unchanged.  Small  acute infarct in the left parietal lobe.  MRA HEAD  Findings: Both vertebral arteries are widely patent.  The basilar is widely patent.  Superior cerebellar and posterior cerebral arteries are patent.  The internal carotid artery is widely patent bilaterally.  Mild stenosis on the left due to atherosclerotic disease.  The anterior and middle cerebral arteries are widely patent bilaterally without significant stenosis.  Negative for cerebral aneurysm.  IMPRESSION:  Mild atherosclerotic disease in the cavernous carotid.  No large vessel occlusion or significant stenosis.   Original Report Authenticated By: Janeece Riggers, M.D.   Mr Brain Wo Contrast  02/18/2013   *RADIOLOGY REPORT*  Clinical Data:  Hemorrhagic stroke.  Subdural hematoma  MRI HEAD WITHOUT CONTRAST MRA HEAD  WITHOUT CONTRAST  Technique:  Multiplanar, multiecho pulse sequences of the brain and surrounding structures were obtained without intravenous contrast. Angiographic images of the head were obtained using MRA technique without contrast.  Comparison:  CT 02/17/2013  MRI HEAD  Findings:  Acute hemorrhagic infarct in the left frontal temporal lobe involving the insula.  This is similar to the CT yesterday. In addition, there is a small area of acute non  hemorrhagic infarct in the left parietal lobe.  Mixed signal subdural hematoma on the left is similar to the CT. This has some layering blood products dependently and hyperintense fluid throughout most of the collection.  This measures approximately 18 mm in thickness, unchanged.  9 mm midline shift to the right is unchanged.  Negative for hydrocephalus.  Generalized atrophy is present.  No underlying mass lesion is identified on unenhanced imaging.  IMPRESSION: Hemorrhagic acute infarct in the right frontal temporal lobe.  This is unchanged from the CT yesterday.  Acute and chronic subdural hematoma measuring up to 18 mm in thickness.  Unchanged.  9 mm midline shift to the right is unchanged.  Small  acute infarct in the left parietal lobe.  MRA HEAD  Findings: Both vertebral arteries are widely patent.  The basilar is widely patent.  Superior cerebellar and posterior cerebral arteries are patent.  The internal carotid artery is widely patent bilaterally.  Mild stenosis on the left due to atherosclerotic disease.  The anterior and middle cerebral arteries are widely patent bilaterally without significant stenosis.  Negative for cerebral aneurysm.  IMPRESSION:  Mild atherosclerotic disease in the cavernous carotid.  No large vessel occlusion or significant stenosis.   Original Report Authenticated By: Janeece Riggers, M.D.   Personally viewed.   Telemetry: AFIB, occas HR > 100, brief 3 beats NSVT (possible aberrancy) Personally viewed.    Cardiac Studies:  EF 60%, moderate AS  Assessment/Plan:  Principal Problem:   Seizures Active Problems:   SDH (subdural hematoma)   Dementia   HTN (hypertension)   Atrial fibrillation, persistent   Subdural hematoma, acute  1) Moderate aortic stenosis  - should not be of significant hemodynamic consequence  2) AFIB  - occasional RVR  - metoprolol has been steadily increased from 12.5 BID to 50 BID.  - no further bradycardia (prior 40's at night)  - no ASA, no  anticoag (ICH)  - at increased risk for embolic stroke unfortunately.   3) HTN  - needs further improvement  - per primary team with neuro guidance  - Dr. Donette Larry is restarting Cardura     Aydn Ferrara 02/19/2013, 8:47 AM

## 2013-02-20 LAB — GLUCOSE, CAPILLARY

## 2013-02-20 MED ORDER — RISPERIDONE 0.5 MG PO TBDP
0.5000 mg | ORAL_TABLET | Freq: Every day | ORAL | Status: DC
  Filled 2013-02-20: qty 1

## 2013-02-20 MED ORDER — RISPERIDONE 0.5 MG PO TBDP: 0.5000 mg | ORAL_TABLET | Freq: Every day | ORAL | Status: DC

## 2013-02-20 MED ORDER — POTASSIUM CHLORIDE ER 10 MEQ PO TBCR: 10.0000 meq | EXTENDED_RELEASE_TABLET | ORAL | Status: DC

## 2013-02-20 NOTE — Clinical Documentation Improvement (Signed)
THIS DOCUMENT IS NOT A PERMANENT PART OF THE MEDICAL RECORD  Please update your documentation with the medical record to reflect your response to this query. If you need help knowing how to do this please call [phone].  02/20/13   Dear Dr. Pearlean Brownie,  In a better effort to capture your patient's severity of illness, reflect appropriate length of stay and utilization of resources, a review of the patient medical record has revealed the following indicators.   Based on your clinical judgment, please clarify and document in a progress note and/or discharge summary the clinical condition associated with the following supporting information: In responding to this query please exercise your independent judgment.  The fact that a query is asked, does not imply that any particular answer is desired or expected.   Hello Dr. Pearlean Brownie!  Abnormal findings (laboratory, x-ray, MRI/CT scans, and other diagnostic results) are not coded and reported unless the physician indicates their clinical significance. The medical record reflects the following clinical findings, if possible, please help by clarifying any diagnostic and/or clinical significance related to this stay:   8/28 CT Head results: There is new cytotoxic edema in the left frontal operculum and insula compatible with infarction. There is multilobulated hemorrhage centrally, with multiple hematomas measuring up to 3 cm axially. Midline shift is similar to prior, approximately 5 mm at the level of the third ventricle.  Possible Clinical Conditions?  - Cerebral Edema  - Cytotoxic Edema  - Other condition (please document in the progress notes and/or discharge summary)  - Cannot Clinically determine at this time     Reviewed: additional documentation in the medical record  Thank You,  Saul Fordyce  Clinical Documentation Specialist: [phone] Health Information Management [place]

## 2013-02-20 NOTE — Discharge Summary (Signed)
Physician Discharge Summary  Patient ID: [name] MRN: [id number] DOB/AGE: 18-Nov-1919 79 y.o.  Admit date: 02/14/2013 Discharge date: 02/20/2013  Admission Diagnoses:  Discharge Diagnoses:  Principal Problem:   Seizures Acute on chronic Subdral hematoma- no surgical intervention Acute Left hemorrhagic frontal lobe; and  smallf parietal lobe- CVA Confusion Dysphasia- dysphga3 die Atrial fibrllation Active Problems:   SDH (subdural hematoma)   Dementia   HTN (hypertension)   Atrial fibrillation, persistent   Subdural hematoma, acute   Discharged Condition: good  Hospital Course: 79 years old male admitted  Having seizure-like episode at home noted by his wife Problem #1: seizure: started on Keppra by neurology. The CT scan showed acute on chronic small subdural hematoma with prior history of subdural hematoma evacuated in 1/14. Neurourgery was consulted. No more recurrence of seizure on Keppra Problem #2: Subdural hematoma acute on Chronic,repeat CT scan Stable, no surgical intervention.Neurosurgery followed Problem #3: facial droop with right-side weakness;Dysphagia;Dysarthria: MRI of the brain showed hemorrhagic left frontal and parietal lobe CVA. No Aspirin therapy or Anticoagulations. Physical therapy,speech, Occupational therapy consult. Continue with dysphagia diet- nectar thickened Problem #4; atrial Fibrillation with RVR: cardiology consulted; rate control with beta blocker, no anticoagulation. Problem #5: hypertension: medication adjusted to keep blood pressure systolic between 782-956. Problem #6: BPH continue current mediation. Problem #7:confussion, underlying history of dementia Aricept; Risperidone at nighttime 0.5 mg. Problem #8: hypothyroidism continue on  Synthroid Patient is DO NOT Resuscitate    Consults: cardiology, neurology and neurosurgery  Significant Diagnostic Studies: labs: Hemoglobin A1c 5.3, LdL 83 magnesium 2.0 creatinine 0.91 sodium 145,  potasium 3.4 WBC 8.6, hemoglobin 12.6, platelets 112 TSH 3.5 troponin negatie and radiology: CXR: normal, MRI: subdural hematoma, CVa acute and CT scan: acute CVA subdural hematoma  Treatments: IV hydration  Discharge Exam: Blood pressure 176/90, pulse 65, temperature 97.7 F (36.5 C), temperature source Oral, resp. rate 18, height 5\' 8"  (1.727 m), weight 79.4 kg (175 lb 0.7 oz), SpO2 99.00%. General appearance: alert Resp: clear to auscultation bilaterally Cardio: irregularly irregular rhythm Neurologic: Motor: rigt-sided weakness  Disposition: 01-Skilled nursing facility  Discharge Orders   Future Orders Complete By Expires   Diet - low sodium heart healthy  As directed    Discharge instructions  As directed    Comments:     Keep condom catherter O2 2L [place] prn. Check Pul ox q shift Dysphagia III diet with nector thickened with full aspiration precautions.   Increase activity slowly  As directed        Medication List         acetaminophen 325 MG tablet  Commonly known as:  TYLENOL  Take 2 tablets (650 mg total) by mouth every 6 (six) hours as needed.     donepezil 5 MG tablet  Commonly known as:  ARICEPT  Take 1 tablet (5 mg total) by mouth at bedtime.     doxazosin 2 MG tablet  Commonly known as:  CARDURA  Take 1 tablet (2 mg total) by mouth at bedtime.     finasteride 5 MG tablet  Commonly known as:  PROSCAR  Take 5 mg by mouth at bedtime.     food thickener Powd  Commonly known as:  THICK IT  With liquids     furosemide 20 MG tablet  Commonly known as:  LASIX  Take 1 tablet (20 mg total) by mouth 3 (three) times a week. M-W-F     levETIRAcetam 500 MG tablet  Commonly known as:  KEPPRA  Take 1 tablet (500 mg total) by mouth 2 (two) times daily.     levothyroxine 25 MCG tablet  Commonly known as:  SYNTHROID, LEVOTHROID  Take 1 tablet (25 mcg total) by mouth daily before breakfast.     metoprolol 50 MG tablet  Commonly known as:  LOPRESSOR  Take 25 mg by  mouth 2 (two) times daily.     potassium chloride 10 MEQ tablet  Commonly known as:  KLOR-CON 10  Take 1 tablet (10 mEq total) by mouth 3 (three) times a week. Along with lasix. M-W-F     risperiDONE 0.5 MG disintegrating tablet  Commonly known as:  RISPERDAL M-TABS  Take 1 tablet (0.5 mg total) by mouth at bedtime.     simvastatin 40 MG tablet  Commonly known as:  ZOCOR  Take 20 mg by mouth every evening.     tamsulosin 0.4 MG Caps capsule  Commonly known as:  FLOMAX  Take 0.4 mg by mouth at bedtime.     VITAMIN D PO  Take 1 tablet by mouth daily at 12 noon.           Follow-up Information   Follow up with Gates Rigg, MD. Schedule an appointment as soon as possible for a visit in 2 months. (stroke clinic)    Specialties:  Neurology, Radiology   Contact information:   [address] [place] [address] [phone]     Discharge planing time total 45 Minutes.  SignedGeorgann Housekeeper 02/20/2013, 8:20 AM

## 2013-02-20 NOTE — Progress Notes (Signed)
Patient for d/c today to SNF bed at [facility]. Family and patient agreeable to this plan- plan transfer via EMS. Reece Levy, MSW [phone]

## 2013-02-20 NOTE — Progress Notes (Signed)
Clinical Social Work Department CLINICAL SOCIAL WORK PLACEMENT NOTE 02/20/2013  Patient:  [name], [name]  Account Number:  [id number] Admit date:  02/14/2013  Clinical Social Worker:  Theresia Bough, Theresia Majors  Date/time:  02/18/2013 02:27 PM  Clinical Social Work is seeking post-discharge placement for this patient at the following level of care:   SKILLED NURSING   (*CSW will update this form in Epic as items are completed)   02/18/2013  Patient/family provided with [facility] Department of Clinical Social Work's list of facilities offering this level of care within the geographic area requested by the patient (or if unable, by the patient's family).  02/18/2013  Patient/family informed of their freedom to choose among providers that offer the needed level of care, that participate in Medicare, Medicaid or managed care program needed by the patient, have an available bed and are willing to accept the patient.  02/18/2013  Patient/family informed of [facility]' ownership interest in [facility], as well as of the fact that they are under no obligation to receive care at this facility.  PASARR submitted to [employer] on 02/18/2013 PASARR number received from [employer] on 02/18/2013  FL2 transmitted to all facilities in geographic area requested by pt/family on  02/18/2013 FL2 transmitted to all facilities within larger geographic area on   Patient informed that his/her managed care company has contracts with or will negotiate with  certain facilities, including the following:     Patient/family informed of bed offers received:  02/20/2013 Patient chooses bed at [facility] Physician recommends and patient chooses bed at    Patient to be transferred to [facility] on  02/20/2013 Patient to be transferred to facility by ems  The following physician request were entered in Epic:   Additional Comments: Reece Levy, MSW [phone]

## 2013-02-20 NOTE — Progress Notes (Signed)
Pt tx to SNF per MD order, family aware of tx, pt stable upon tx, pt had wallet with 4 ten dollar bills, driver's liscense, [place] ID, master's card, [employer], insurance card, [employer] card, keys, report called to receiving RN, all questions answered, wallet and contents notified with [place] [phone]

## 2013-02-20 NOTE — Progress Notes (Signed)
Physical Therapy Treatment Patient Details Name: [name] MRN: [id number] DOB: 1919/09/16 Today's Date: 02/20/2013 Time: 4098-1191 PT Time Calculation (min): 25 min  PT Assessment / Plan / Recommendation  History of Present Illness [name] is a 79 y.o. male PMHx dementia, hypothyroidism, A- fib, chronic subdural hematoma, HTN, male who was normal last night. When he woke up this AM wife states they ate breakfast together and then she went to take a shower approximately 0900-0930. When she returns states patient  was in the bedroom and not following commands. She noted a right facial droop also. Patient has a home health nurse who cares for her son at home who recommended they call EMS. There was no jerking or tonic clonic movement noted but patient was incontinent of urine. EMS was called and patient was brought to hospital as a code stroke. Initial CT head showed chronic left subdural hemorrhage with acute hemorrhagic infarct in the left frontal temporal lobe involving the insula . It is belived that the pt had a seizure   PT Comments   Pt admitted with above. Pt currently with functional limitations due to continued weakness and confusion limiting safety with impulsivity noted.   Pt will benefit from skilled PT to increase their independence and safety with mobility to allow discharge to the venue listed below.   Follow Up Recommendations  SNF;Supervision/Assistance - 24 hour                 Equipment Recommendations  Rolling walker with 5" wheels        Frequency Min 2X/week   Progress towards PT Goals Progress towards PT goals: Progressing toward goals  Plan Current plan remains appropriate    Precautions / Restrictions Precautions Precautions: Fall Restrictions Weight Bearing Restrictions: No   Pertinent Vitals/Pain VSS, No pain    Mobility  Bed Mobility Bed Mobility: Rolling Right;Right Sidelying to Sit;Sitting - Scoot to [place] of Bed Rolling Right: 4:  Min assist Rolling Left: Not tested (comment) Right Sidelying to Sit: 3: Mod assist Left Sidelying to Sit: Not tested (comment) Supine to Sit: Not tested (comment) Sitting - Scoot to Edge of Bed: 3: Mod assist Sit to Supine: Not Tested (comment) Details for Bed Mobility Assistance: Pt needed assist to initiate movement.  Pt more awake today opening eyes.  Needed cues for hand placement.  Pt trying to put hands on RW to pull up and placing them all over the walker.   Transfers Transfers: Sit to Stand;Stand to Sit;Stand Pivot Transfers Sit to Stand: 1: +2 Total assist;With upper extremity assist;From bed Sit to Stand: Patient Percentage: 40% Stand to Sit: 1: +2 Total assist;With upper extremity assist;To chair/3-in-1;With armrests Stand to Sit: Patient Percentage: 40% Stand Pivot Transfers: 1: +2 Total assist Stand Pivot Transfers: Patient Percentage: 60% Details for Transfer Assistance: Pt needed cues for hand placement.  Pt having a hard time following commands.  Finally able to stand up but needed a lot of cues and assist.  Once up, pt had difficulty keeping upright with flexed hips, knees and trunk.   Poor processing of commands.   Ambulation/Gait Ambulation/Gait Assistance: Not tested (comment) Stairs: No Wheelchair Mobility Wheelchair Mobility: No    Exercises General Exercises - Lower Extremity Long Arc Quad: AROM;Both;5 reps;Seated Hip Flexion/Marching: AROM;Both;10 reps;Seated    PT Goals (current goals can now be found in the care plan section)    Visit Information  Last PT Received On: 02/20/13 Assistance Needed: +2 History of Present Illness:  [name] is a 79 y.o. male PMHx dementia, hypothyroidism, A- fib, chronic subdural hematoma, HTN, male who was normal last night. When he woke up this AM wife states they ate breakfast together and then she went to take a shower approximately 0900-0930. When she returns states patient  was in the bedroom and not following  commands. She noted a right facial droop also. Patient has a home health nurse who cares for her son at home who recommended they call EMS. There was no jerking or tonic clonic movement noted but patient was incontinent of urine. EMS was called and patient was brought to hospital as a code stroke. Initial CT head showed chronic left subdural hemorrhage with acute hemorrhagic infarct in the left frontal temporal lobe involving the insula . It is belived that the pt had a seizure    Subjective Data  Subjective: "I can't go out."   Cognition  Cognition Arousal/Alertness: Awake/alert Behavior During Therapy: Anxious;Impulsive Overall Cognitive Status: Impaired/Different from baseline Area of Impairment: Orientation;Attention;Following commands;Safety/judgement;Awareness Orientation Level: Disoriented to;Place;Situation;Time;Person Current Attention Level: Focused Following Commands: Follows one step commands inconsistently Safety/Judgement: Decreased awareness of safety (trying to stand before we were ready) Problem Solving: Requires verbal cues;Requires tactile cues    Balance  Static Sitting Balance Static Sitting - Balance Support: No upper extremity supported;Feet supported Static Sitting - Level of Assistance: 4: Min assist Static Sitting - Comment/# of Minutes: 5 Static Standing Balance Static Standing - Balance Support: Bilateral upper extremity supported;During functional activity Static Standing - Level of Assistance: 4: Min assist Static Standing - Comment/# of Minutes: 3  End of Session PT - End of Session Equipment Utilized During Treatment: Gait belt Activity Tolerance: Patient tolerated treatment well Patient left: in chair;with call bell/phone within reach;with chair alarm set;with nursing/sitter in room Nurse Communication: Mobility status        INGOLD,Cassaundra Rasch 02/20/2013, 1:36 PM [facility] [phone] (pager)

## 2013-03-12 ENCOUNTER — Telehealth: Payer: Self-pay

## 2013-03-12 NOTE — Telephone Encounter (Signed)
I spoke again with [name] at [facility]. It does not appear that any referral was generated by [place]. She will contact person listed as making the referral. She also clarified that she intended this request to go to Dr. Pearlean Brownie, not Dr. [name] Furbish. Please disregard last note.

## 2013-03-12 NOTE — Telephone Encounter (Signed)
Received call that patient is in office at [facility]. He is waiting for CXR, EKG and lab work. They have no orders. Please fax. Call came from [place] at [phone] or [phone]. Fax is [phone]

## 2013-03-14 NOTE — Telephone Encounter (Signed)
No MRI or other work was completed.

## 2013-04-17 ENCOUNTER — Encounter: Payer: Self-pay | Admitting: Nurse Practitioner

## 2013-04-23 ENCOUNTER — Telehealth: Payer: Self-pay | Admitting: Nurse Practitioner

## 2013-04-23 NOTE — Telephone Encounter (Signed)
Wife says nurse that brings patient to the doctor, called nurse didn't get a answer. Per Marylene Land

## 2013-04-24 ENCOUNTER — Ambulatory Visit (INDEPENDENT_AMBULATORY_CARE_PROVIDER_SITE_OTHER): Payer: Medicare Other | Admitting: Nurse Practitioner

## 2013-04-24 ENCOUNTER — Encounter: Payer: Self-pay | Admitting: Nurse Practitioner

## 2013-04-24 ENCOUNTER — Encounter (INDEPENDENT_AMBULATORY_CARE_PROVIDER_SITE_OTHER): Payer: Self-pay

## 2013-04-24 VITALS — BP 109/73 | HR 81 | Temp 97.5°F

## 2013-04-24 DIAGNOSIS — I4819 Other persistent atrial fibrillation: Secondary | ICD-10-CM

## 2013-04-24 DIAGNOSIS — F068 Other specified mental disorders due to known physiological condition: Secondary | ICD-10-CM

## 2013-04-24 DIAGNOSIS — F03C0 Unspecified dementia, severe, without behavioral disturbance, psychotic disturbance, mood disturbance, and anxiety: Secondary | ICD-10-CM

## 2013-04-24 DIAGNOSIS — I619 Nontraumatic intracerebral hemorrhage, unspecified: Secondary | ICD-10-CM

## 2013-04-24 DIAGNOSIS — I6200 Nontraumatic subdural hemorrhage, unspecified: Secondary | ICD-10-CM

## 2013-04-24 DIAGNOSIS — F0390 Unspecified dementia without behavioral disturbance: Secondary | ICD-10-CM

## 2013-04-24 DIAGNOSIS — S065XAA Traumatic subdural hemorrhage with loss of consciousness status unknown, initial encounter: Secondary | ICD-10-CM

## 2013-04-24 DIAGNOSIS — I4891 Unspecified atrial fibrillation: Secondary | ICD-10-CM

## 2013-04-24 DIAGNOSIS — S065X9A Traumatic subdural hemorrhage with loss of consciousness of unspecified duration, initial encounter: Secondary | ICD-10-CM

## 2013-04-24 NOTE — Patient Instructions (Signed)
PLAN: Continue Keppra 500 mg bid for seizure prophylaxis. Continue Aricept as tolerated. Follow up only as needed.

## 2013-04-24 NOTE — Progress Notes (Signed)
[name]  PATIENT: [name] DOB: 1919/07/25   REASON FOR VISIT: follow up HISTORY FROM: patient  HISTORY OF PRESENT ILLNESS: 02/10/11   This 79 year old, Italian-American [name])  gentleman is referred for memory loss, and he added gait and balance difficulties. He had just been visited by his son and this visit ended with the son's advice to have him checked out for "some Alzheimers that's creeping in" or so is his recollection. He is no longer driving alone, but in the co-pilot seat sits his wife, giving directions.  He has started to forget names, and gets frustrated, has been unable to walk well due to spinal stenosis.He was last evaluated by Dr. Vickey Huger 11/10/10.  He  has noted some leg weakness during ballroom dancing, a hobby of his wife's and his. 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 the couple enjoys ball room dancing, but recently  [name] needed to take breaks between dances, and these have become longer . He has described weakness and aching in the lower extremities.  Brain imaging positive for early Alzheimers disease per Dr. Oliva Bustard phone note dated 12/01/10. Was started on Aricept but had GI side effects and Dr. Eula Listen stopped the med. Wife and pt note memory declining slowly  UPDATE 04/24/13 (LL):  [name] is an 79 y.o. male who presented to hospital on 02/14/2013. When he woke up wife noted he was not as talkative as usual but she though nothing of it. She went to take a shower and when she finished he was in the bedroom and not following commands. She noted a right facial droop also. Patient has a home health nurse who cares for her son at home who recommended they call EMS. There was no jerking or tonic clonic movement noted but patient was incontinent. EMS was called and patient was brought to hospital as a code stroke. Initial CT head showed chronic left subdural  hemorrhage from 06/23/12 with superimposed acute or subacute bleed.   Patient is in Afib and has been in Afib since last bleed 06/23/12.  MRI of the brain on 02/18/2013 showed hemorrhagic acute infarct in the right frontal temporal lobe.  Acute and chronic subdural hematoma measuring up to 18 mm in thickness. Unchanged. 9 mm midline shift to the right is unchanged. Small acute infarct in the left parietal lobe.   MRA of the brain on 02/18/2013 showed mild atherosclerotic disease in the cavernous carotid. No large vessel occlusion or significant stenosis.  2D Echocardiogram 02/17/2013 EF 55-60% with no source of embolus.  Carotid Doppler showed bilateral 1-39% ICA stenosis. Vertebral artery flow was antegrade.  He is brought to the office for stroke follow up visit with driver from nursing home who does not know any information about patient.  Patient is alert to self only, cannot hold conversation, he is very sleepy.  Driver states that he is going to be discharged home tomorrow but she knows no other information.   REVIEW OF SYSTEMS: Full 14 system review of systems performed and notable only for: (nothing).   ALLERGIES: Allergies  Allergen Reactions  . Erythromycin   . Norvasc [Amlodipine Besylate]   . Contrast Media [Iodinated Diagnostic Agents] Rash    HOME MEDICATIONS: Outpatient Prescriptions Prior to Visit  Medication Sig Dispense Refill  . acetaminophen (TYLENOL) 325 MG tablet Take 2 tablets (650 mg total) by mouth every 6 (six) hours as needed.  30 tablet  3  . Cholecalciferol (VITAMIN D PO) Take 1 tablet by mouth daily at 12 noon.      . donepezil (ARICEPT) 5 MG tablet Take 1 tablet (5 mg total) by mouth at bedtime.  30 tablet  3  . doxazosin (CARDURA) 2 MG tablet Take 1 tablet (2 mg total) by mouth at bedtime.  30 tablet  3  . finasteride (PROSCAR) 5 MG tablet Take 5 mg by mouth at bedtime.      . food thickener (THICK IT) POWD With liquids  1 Can  0  . furosemide (LASIX) 20 MG tablet Take  1 tablet (20 mg total) by mouth 3 (three) times a week. M-W-F  30 tablet  3  . levETIRAcetam (KEPPRA) 500 MG tablet Take 1 tablet (500 mg total) by mouth 2 (two) times daily.  30 tablet  2  . levothyroxine (SYNTHROID, LEVOTHROID) 25 MCG tablet Take 1 tablet (25 mcg total) by mouth daily before breakfast.  30 tablet  3  . metoprolol (LOPRESSOR) 50 MG tablet Take 25 mg by mouth 2 (two) times daily.      . potassium chloride (KLOR-CON 10) 10 MEQ tablet Take 1 tablet (10 mEq total) by mouth 3 (three) times a week. Along with lasix. M-W-F  30 tablet  3  . risperiDONE (RISPERDAL M-TABS) 0.5 MG disintegrating tablet Take 1 tablet (0.5 mg total) by mouth at bedtime.  30 tablet  3  . simvastatin (ZOCOR) 40 MG tablet Take 20 mg by mouth every evening.      . tamsulosin (FLOMAX) 0.4 MG CAPS capsule Take 0.4 mg by mouth at bedtime.       No facility-administered medications prior to visit.    PAST MEDICAL HISTORY: Past Medical History  Diagnosis Date  . Dementia   . A-fib   . Enlarged prostate   . Thyroid disease     hypothyroid  . Hypertension   . Spinal stenosis, lumbar     PAST SURGICAL HISTORY: Past Surgical History  Procedure Laterality Date  . Eye surgery    . Kidney stones      FAMILY HISTORY: Family History  Problem Relation Age of Onset  . Cancer - Colon Mother   . Stroke Father   . Cancer - Lung Sister   . Stroke Brother     SOCIAL HISTORY: History   Social History  . Marital Status: Married    Spouse Name: [name]    Number of Children: 3  . Years of Education: 12   Occupational History  . retired    Social History Main Topics  . Smoking status: Former Smoker    Quit date: 04/17/1964  . Smokeless tobacco: Not on file  . Alcohol Use: Yes     Comment: occ  . Drug Use: No  . Sexual Activity: No   Other Topics Concern  . Not on file   Social History Narrative  . No narrative on file   PHYSICAL EXAM  Filed Vitals:   04/24/13 1451  BP: 109/73  Pulse: 81     There is no weight on file to calculate BMI.  Physical Exam  General: Patient appears in no acute distress, well groomed, sleepy Head: Symmetric facial features, Neck: Supple. no carotid bruits Respiratory: Lungs are clear to auscultation with normal respiratory effort. Cardiovascular:  RRR Musculoskeletal: Equal tone and mass, severe kyphosis. Skin: No rash and no bruising.  Neurologic Exam  Mental Status: Awake but falls, but very sleepy, Answers in a  few words and falls asleep in wheelchair.  Oriented to person only.  Speech is soft, does not complete sentences.  Unable to participate in MMSE. Cranial Nerves: Full visual fields to double simultaneous stimuli.  Round, reactive pupils.   Extraocular movements are full and conjugate.  Facial movements are symmetric. Motor: Strength, tone, and mass in all extremities normal.  Hip flexion weakness on the right 4-5, not on the left, adduction and abduction, grip intact.  Sensory: Intact to light touch Coordination:  Unable to particpate in testing. Gait and Station: Unable to test.   Reflexes: Deep tendon reflexes symmetric bilaterally.   DIAGNOSTIC DATA (LABS, IMAGING, TESTING) - I reviewed patient records, labs, notes, testing and imaging myself where available.  Lab Results  Component Value Date   WBC 8.6 02/18/2013   HGB 12.6* 02/18/2013   HCT 37.4* 02/18/2013   MCV 98.7 02/18/2013   PLT 112* 02/18/2013      Component Value Date/Time   NA 145 02/18/2013 0538   K 3.4* 02/18/2013 0538   CL 107 02/18/2013 0538   CO2 26 02/18/2013 0538   GLUCOSE 115* 02/18/2013 0538   BUN 25* 02/18/2013 0538   CREATININE 0.91 02/18/2013 0538   CALCIUM 9.3 02/18/2013 0538   PROT 5.9* 02/17/2013 0400   ALBUMIN 3.0* 02/17/2013 0400   AST 16 02/17/2013 0400   ALT 8 02/17/2013 0400   ALKPHOS 54 02/17/2013 0400   BILITOT 2.2* 02/17/2013 0400   GFRNONAA 72* 02/18/2013 0538   GFRAA 84* 02/18/2013 0538   Lab Results  Component Value Date   CHOL 148 02/19/2013   HDL 53  02/19/2013   LDLCALC 83 02/19/2013   TRIG 60 02/19/2013   CHOLHDL 2.8 02/19/2013   Lab Results  Component Value Date   HGBA1C 5.3 02/19/2013   No results found for this basename: [id number]   Lab Results  Component Value Date   TSH 3.527 02/14/2013    ASSESSMENT AND PLAN Brian. [name] is a 79 y.o. male with history of Alzheimer's Dementia presenting initially to hospital on 02/14/13 with AMS, likely secondary to seizure activity, noted to have change in mental status.  While in hospital found to have acute infarct right frontal temporal lobe with hemorrhagic transformation and mild cytotoxic edema.  Patient also with acute and chronic subdural hematoma with midline shift (evacuation in Jan 2014).  Also small left parietal lobe infarct. Infarcts felt to be embolic given hx A fib, off anticoagulation, and location would have concern for embolic event as etiology of bleed. He has declined greatly since last being seen in this office 2 years ago.  PLAN: Continue Keppra 500 mg bid for seizure prophylaxis. Continue Aricept as tolerated. Driver states he is being discharged home tomorrow, no details known; patient will need constant supervision.  Follow up in our office only as needed.  Ronal Fear, MSN, NP-C 04/24/2013, 2:56 PM [name] [address], [address] [place], [place] [address] [phone]  Note: This document was prepared with digital dictation and possible smart phrase technology. Any transcriptional errors that result from this process are unintentional.

## 2013-04-26 ENCOUNTER — Encounter (HOSPITAL_COMMUNITY): Payer: Medicare Other

## 2013-07-25 ENCOUNTER — Emergency Department (HOSPITAL_COMMUNITY): Payer: Medicare Other

## 2013-07-25 ENCOUNTER — Inpatient Hospital Stay (HOSPITAL_COMMUNITY)
Admission: EM | Admit: 2013-07-25 | Discharge: 2013-07-29 | DRG: 064 | Disposition: A | Discharge status: Skilled Nursing Facility | Payer: Medicare Other | Attending: Internal Medicine | Admitting: Internal Medicine

## 2013-07-25 ENCOUNTER — Encounter (HOSPITAL_COMMUNITY): Payer: Self-pay | Admitting: Emergency Medicine

## 2013-07-25 DIAGNOSIS — I6350 Cerebral infarction due to unspecified occlusion or stenosis of unspecified cerebral artery: Principal | ICD-10-CM | POA: Diagnosis present

## 2013-07-25 DIAGNOSIS — E039 Hypothyroidism, unspecified: Secondary | ICD-10-CM | POA: Diagnosis present

## 2013-07-25 DIAGNOSIS — R569 Unspecified convulsions: Secondary | ICD-10-CM | POA: Diagnosis present

## 2013-07-25 DIAGNOSIS — N400 Enlarged prostate without lower urinary tract symptoms: Secondary | ICD-10-CM | POA: Diagnosis present

## 2013-07-25 DIAGNOSIS — Z823 Family history of stroke: Secondary | ICD-10-CM

## 2013-07-25 DIAGNOSIS — Z66 Do not resuscitate: Secondary | ICD-10-CM | POA: Diagnosis present

## 2013-07-25 DIAGNOSIS — R55 Syncope and collapse: Secondary | ICD-10-CM | POA: Diagnosis present

## 2013-07-25 DIAGNOSIS — M48061 Spinal stenosis, lumbar region without neurogenic claudication: Secondary | ICD-10-CM | POA: Diagnosis present

## 2013-07-25 DIAGNOSIS — I4891 Unspecified atrial fibrillation: Secondary | ICD-10-CM | POA: Diagnosis present

## 2013-07-25 DIAGNOSIS — S065X9A Traumatic subdural hemorrhage with loss of consciousness of unspecified duration, initial encounter: Secondary | ICD-10-CM | POA: Diagnosis present

## 2013-07-25 DIAGNOSIS — B961 Klebsiella pneumoniae [K. pneumoniae] as the cause of diseases classified elsewhere: Secondary | ICD-10-CM | POA: Diagnosis present

## 2013-07-25 DIAGNOSIS — I639 Cerebral infarction, unspecified: Secondary | ICD-10-CM

## 2013-07-25 DIAGNOSIS — F0390 Unspecified dementia without behavioral disturbance: Secondary | ICD-10-CM | POA: Diagnosis present

## 2013-07-25 DIAGNOSIS — I4819 Other persistent atrial fibrillation: Secondary | ICD-10-CM | POA: Diagnosis present

## 2013-07-25 DIAGNOSIS — Z87891 Personal history of nicotine dependence: Secondary | ICD-10-CM

## 2013-07-25 DIAGNOSIS — R5381 Other malaise: Secondary | ICD-10-CM | POA: Diagnosis present

## 2013-07-25 DIAGNOSIS — I6200 Nontraumatic subdural hemorrhage, unspecified: Secondary | ICD-10-CM | POA: Diagnosis present

## 2013-07-25 DIAGNOSIS — R040 Epistaxis: Secondary | ICD-10-CM

## 2013-07-25 DIAGNOSIS — S065XAA Traumatic subdural hemorrhage with loss of consciousness status unknown, initial encounter: Secondary | ICD-10-CM | POA: Diagnosis present

## 2013-07-25 DIAGNOSIS — R1311 Dysphagia, oral phase: Secondary | ICD-10-CM | POA: Diagnosis present

## 2013-07-25 DIAGNOSIS — Z8673 Personal history of transient ischemic attack (TIA), and cerebral infarction without residual deficits: Secondary | ICD-10-CM

## 2013-07-25 DIAGNOSIS — R259 Unspecified abnormal involuntary movements: Secondary | ICD-10-CM | POA: Diagnosis present

## 2013-07-25 DIAGNOSIS — N390 Urinary tract infection, site not specified: Secondary | ICD-10-CM | POA: Diagnosis present

## 2013-07-25 DIAGNOSIS — Z87442 Personal history of urinary calculi: Secondary | ICD-10-CM

## 2013-07-25 DIAGNOSIS — R262 Difficulty in walking, not elsewhere classified: Secondary | ICD-10-CM | POA: Diagnosis present

## 2013-07-25 DIAGNOSIS — I10 Essential (primary) hypertension: Secondary | ICD-10-CM | POA: Diagnosis present

## 2013-07-25 DIAGNOSIS — I160 Hypertensive urgency: Secondary | ICD-10-CM

## 2013-07-25 DIAGNOSIS — R5383 Other fatigue: Secondary | ICD-10-CM

## 2013-07-25 LAB — COMPREHENSIVE METABOLIC PANEL
ALT: 11 U/L (ref 0–53)
AST: 17 U/L (ref 0–37)
Albumin: 3.5 g/dL (ref 3.5–5.2)
Alkaline Phosphatase: 69 U/L (ref 39–117)
BUN: 28 mg/dL — AB (ref 6–23)
CALCIUM: 9.4 mg/dL (ref 8.4–10.5)
CO2: 26 meq/L (ref 19–32)
Chloride: 104 mEq/L (ref 96–112)
Creatinine, Ser: 1.09 mg/dL (ref 0.50–1.35)
GFR, EST AFRICAN AMERICAN: 66 mL/min — AB (ref >90)
GFR, EST NON AFRICAN AMERICAN: 57 mL/min — AB (ref >90)
GLUCOSE: 146 mg/dL — AB (ref 70–99)
Potassium: 3.9 mEq/L (ref 3.7–5.3)
SODIUM: 145 meq/L (ref 137–147)
TOTAL PROTEIN: 6.8 g/dL (ref 6.0–8.3)
Total Bilirubin: 1.2 mg/dL (ref 0.3–1.2)

## 2013-07-25 LAB — URINALYSIS, ROUTINE W REFLEX MICROSCOPIC
BILIRUBIN URINE: NEGATIVE
Glucose, UA: NEGATIVE mg/dL
Ketones, ur: 15 mg/dL — AB
NITRITE: NEGATIVE
PROTEIN: 100 mg/dL — AB
Specific Gravity, Urine: 1.016 (ref 1.005–1.030)
UROBILINOGEN UA: 0.2 mg/dL (ref 0.0–1.0)
pH: 7 (ref 5.0–8.0)

## 2013-07-25 LAB — CBC WITH DIFFERENTIAL/PLATELET
Basophils Absolute: 0 10*3/uL (ref 0.0–0.1)
Basophils Relative: 0 % (ref 0–1)
EOS ABS: 0.1 10*3/uL (ref 0.0–0.7)
EOS PCT: 1 % (ref 0–5)
HEMATOCRIT: 37 % — AB (ref 39.0–52.0)
HEMOGLOBIN: 12.7 g/dL — AB (ref 13.0–17.0)
LYMPHS ABS: 1.8 10*3/uL (ref 0.7–4.0)
LYMPHS PCT: 26 % (ref 12–46)
MCH: 34 pg (ref 26.0–34.0)
MCHC: 34.3 g/dL (ref 30.0–36.0)
MCV: 98.9 fL (ref 78.0–100.0)
MONO ABS: 0.4 10*3/uL (ref 0.1–1.0)
MONOS PCT: 6 % (ref 3–12)
Neutro Abs: 4.4 10*3/uL (ref 1.7–7.7)
Neutrophils Relative %: 67 % (ref 43–77)
PLATELETS: 111 10*3/uL — AB (ref 150–400)
RBC: 3.74 MIL/uL — AB (ref 4.22–5.81)
RDW: 14.2 % (ref 11.5–15.5)
WBC: 6.6 10*3/uL (ref 4.0–10.5)

## 2013-07-25 LAB — URINE MICROSCOPIC-ADD ON

## 2013-07-25 LAB — TROPONIN I

## 2013-07-25 LAB — MRSA PCR SCREENING: MRSA by PCR: NEGATIVE

## 2013-07-25 MED ORDER — DONEPEZIL HCL 5 MG PO TABS
5.0000 mg | ORAL_TABLET | Freq: Every day | ORAL | Status: DC
  Administered 2013-07-25 – 2013-07-28 (×3): 5 mg via ORAL
  Filled 2013-07-25 (×5): qty 1

## 2013-07-25 MED ORDER — RISPERIDONE 0.5 MG PO TBDP
0.5000 mg | ORAL_TABLET | Freq: Every day | ORAL | Status: DC
  Administered 2013-07-25 – 2013-07-28 (×3): 0.5 mg via ORAL
  Filled 2013-07-25 (×5): qty 1

## 2013-07-25 MED ORDER — ACETAMINOPHEN 650 MG RE SUPP: 650.0000 mg | Freq: Four times a day (QID) | RECTAL | Status: DC | PRN

## 2013-07-25 MED ORDER — HYDRALAZINE HCL 20 MG/ML IJ SOLN
20.0000 mg | Freq: Four times a day (QID) | INTRAMUSCULAR | Status: DC | PRN
  Administered 2013-07-26 – 2013-07-29 (×4): 20 mg via INTRAVENOUS
  Filled 2013-07-25 (×5): qty 1

## 2013-07-25 MED ORDER — HYDRALAZINE HCL 20 MG/ML IJ SOLN
10.0000 mg | Freq: Four times a day (QID) | INTRAMUSCULAR | Status: DC | PRN
  Administered 2013-07-25: 10 mg via INTRAVENOUS
  Filled 2013-07-25: qty 1

## 2013-07-25 MED ORDER — DEXTROSE 5 % IV SOLN
1.0000 g | Freq: Once | INTRAVENOUS | Status: AC
  Administered 2013-07-25: 1 g via INTRAVENOUS
  Filled 2013-07-25: qty 10

## 2013-07-25 MED ORDER — VITAMIN D3 25 MCG (1000 UNIT) PO TABS
2000.0000 [IU] | ORAL_TABLET | Freq: Every day | ORAL | Status: DC
  Administered 2013-07-26 – 2013-07-29 (×3): 2000 [IU] via ORAL
  Filled 2013-07-25 (×4): qty 2

## 2013-07-25 MED ORDER — FINASTERIDE 5 MG PO TABS
5.0000 mg | ORAL_TABLET | Freq: Every day | ORAL | Status: DC
  Administered 2013-07-26 – 2013-07-29 (×3): 5 mg via ORAL
  Filled 2013-07-25 (×4): qty 1

## 2013-07-25 MED ORDER — SODIUM CHLORIDE 0.9 % IV SOLN
INTRAVENOUS | Status: AC
  Administered 2013-07-25: 22:00:00 via INTRAVENOUS

## 2013-07-25 MED ORDER — ONDANSETRON HCL 4 MG/2ML IJ SOLN
4.0000 mg | Freq: Once | INTRAMUSCULAR | Status: AC
  Administered 2013-07-25: 4 mg via INTRAVENOUS
  Filled 2013-07-25: qty 2

## 2013-07-25 MED ORDER — SODIUM CHLORIDE 0.9 % IJ SOLN
3.0000 mL | Freq: Two times a day (BID) | INTRAMUSCULAR | Status: DC
  Administered 2013-07-27: 3 mL via INTRAVENOUS

## 2013-07-25 MED ORDER — SODIUM CHLORIDE 0.9 % IV SOLN
INTRAVENOUS | Status: AC
  Administered 2013-07-25: 19:00:00 via INTRAVENOUS

## 2013-07-25 MED ORDER — HYDRALAZINE HCL 20 MG/ML IJ SOLN
10.0000 mg | Freq: Once | INTRAMUSCULAR | Status: AC
  Administered 2013-07-25: 10 mg via INTRAVENOUS
  Filled 2013-07-25: qty 1

## 2013-07-25 MED ORDER — TAMSULOSIN HCL 0.4 MG PO CAPS
0.4000 mg | ORAL_CAPSULE | Freq: Every day | ORAL | Status: DC
  Administered 2013-07-25 – 2013-07-28 (×3): 0.4 mg via ORAL
  Filled 2013-07-25 (×5): qty 1

## 2013-07-25 MED ORDER — ACETAMINOPHEN 325 MG PO TABS
650.0000 mg | ORAL_TABLET | Freq: Four times a day (QID) | ORAL | Status: DC | PRN
  Administered 2013-07-29: 650 mg via ORAL
  Filled 2013-07-25: qty 2

## 2013-07-25 MED ORDER — SIMVASTATIN 20 MG PO TABS
20.0000 mg | ORAL_TABLET | Freq: Every evening | ORAL | Status: DC
  Administered 2013-07-25 – 2013-07-28 (×3): 20 mg via ORAL
  Filled 2013-07-25 (×5): qty 1

## 2013-07-25 MED ORDER — MORPHINE SULFATE 2 MG/ML IJ SOLN
2.0000 mg | Freq: Once | INTRAMUSCULAR | Status: AC
  Administered 2013-07-25: 2 mg via INTRAVENOUS
  Filled 2013-07-25: qty 1

## 2013-07-25 MED ORDER — LEVOTHYROXINE SODIUM 25 MCG PO TABS
25.0000 ug | ORAL_TABLET | Freq: Every day | ORAL | Status: DC
  Administered 2013-07-26: 25 ug via ORAL
  Filled 2013-07-25 (×3): qty 1

## 2013-07-25 MED ORDER — DEXTROSE 5 % IV SOLN
1.0000 g | INTRAVENOUS | Status: DC
  Administered 2013-07-26 – 2013-07-27 (×2): 1 g via INTRAVENOUS
  Filled 2013-07-25 (×3): qty 10

## 2013-07-25 MED ORDER — LEVETIRACETAM 500 MG PO TABS
500.0000 mg | ORAL_TABLET | Freq: Two times a day (BID) | ORAL | Status: DC
  Administered 2013-07-25 – 2013-07-26 (×3): 500 mg via ORAL
  Filled 2013-07-25 (×5): qty 1

## 2013-07-25 MED ORDER — METOPROLOL TARTRATE 1 MG/ML IV SOLN
5.0000 mg | Freq: Once | INTRAVENOUS | Status: AC
  Administered 2013-07-25: 5 mg via INTRAVENOUS
  Filled 2013-07-25: qty 5

## 2013-07-25 MED ORDER — METOPROLOL TARTRATE 25 MG PO TABS
25.0000 mg | ORAL_TABLET | Freq: Two times a day (BID) | ORAL | Status: DC
  Administered 2013-07-25 – 2013-07-26 (×3): 25 mg via ORAL
  Filled 2013-07-25 (×5): qty 1

## 2013-07-25 NOTE — ED Notes (Signed)
[facility] presents with a 80 yo male from home that had a near syncopal episode.  Pt family states that pt was just walking around at home when pt "fell out" but no LOC.  Pt is alert but oriented to self and place.  Hx of stroke in October 2014.

## 2013-07-25 NOTE — ED Notes (Signed)
Pt too sleepy/drowsy to ambulate or to perform orthostatic VS

## 2013-07-25 NOTE — ED Provider Notes (Signed)
CSN: [id number]     Arrival date & time 07/25/13  1526 History   First MD Initiated Contact with Patient 07/25/13 1537     Chief Complaint  Patient presents with  . Near Syncope   (Consider location/radiation/quality/duration/timing/severity/associated sxs/prior Treatment) HPI Comments: Patient presents from home after having near syncopal episode. Family states he was walking and all of a sudden became weak and had to be lowered to the ground. He remembers feeling lightheaded and dizzy. He denies any chest pain or shortness of breath. History of dementia and his history is unreliable. He endorses previous history of hemorrhagic stroke, atrial fibrillation, and hypertension. Family endorses no nausea, vomiting or diarrhea. No fever. Good by mouth intake and urine output.  He has since had some nausea and episodes of vomiting since EMS arrived.  Denies any abdominal pain.  The history is provided by the patient and a relative. The history is limited by the condition of the patient.    Past Medical History  Diagnosis Date  . Dementia   . A-fib   . Enlarged prostate   . Thyroid disease     hypothyroid  . Hypertension   . Spinal stenosis, lumbar    Past Surgical History  Procedure Laterality Date  . Eye surgery    . Kidney stones     Family History  Problem Relation Age of Onset  . Cancer - Colon Mother   . Stroke Father   . Cancer - Lung Sister   . Stroke Brother    History  Substance Use Topics  . Smoking status: Former Smoker    Quit date: 04/17/1964  . Smokeless tobacco: Not on file  . Alcohol Use: Yes     Comment: occ    Review of Systems  Unable to perform ROS: Dementia  Constitutional: Positive for activity change, appetite change and fatigue. Negative for fever.  HENT: Negative for congestion.   Respiratory: Negative for cough, chest tightness and shortness of breath.   Cardiovascular: Negative for chest pain.  Gastrointestinal: Negative for nausea, vomiting and  abdominal pain.  Genitourinary: Negative for dysuria and hematuria.  Skin: Negative for rash.  Neurological: Positive for dizziness, weakness and light-headedness. Negative for headaches.  A complete 10 system review of systems was obtained and all systems are negative except as noted in the HPI and PMH.    Allergies  Erythromycin; Norvasc; and Contrast media  Home Medications   No current outpatient prescriptions on file. BP 199/123  Pulse 89  Temp(Src) 98.2 F (36.8 C) (Oral)  Resp 20  Ht 5\' 8"  (1.727 m)  Wt 179 lb 7.3 oz (81.4 kg)  BMI 27.29 kg/m2  SpO2 94% Physical Exam  Constitutional: He appears well-developed and well-nourished. No distress.  HENT:  Head: Normocephalic and atraumatic.  Mouth/Throat: Oropharynx is clear and moist. No oropharyngeal exudate.  Eyes: Conjunctivae and EOM are normal. Pupils are equal, round, and reactive to light.  Neck: Normal range of motion. Neck supple.  No C spine tenderness  Cardiovascular: Normal rate, regular rhythm and normal heart sounds.   No murmur heard. Pulmonary/Chest: Effort normal and breath sounds normal. No respiratory distress.  Abdominal: Soft. There is no tenderness. There is no rebound and no guarding.  Musculoskeletal: Normal range of motion. He exhibits no edema and no tenderness.  Neurological: He is alert.  Oriented to person and place  CN 2-12 intact, no ataxia on finger to nose, no nystagmus, 5/5 strength throughout, no pronator drift,  Skin: Skin is warm.    ED Course  Procedures (including critical care time) Labs Review Labs Reviewed  CBC WITH DIFFERENTIAL - Abnormal; Notable for the following:    RBC 3.74 (*)    Hemoglobin 12.7 (*)    HCT 37.0 (*)    Platelets 111 (*)    All other components within normal limits  COMPREHENSIVE METABOLIC PANEL - Abnormal; Notable for the following:    Glucose, Bld 146 (*)    BUN 28 (*)    GFR calc non Af Amer 57 (*)    GFR calc Af Amer 66 (*)    All other  components within normal limits  URINALYSIS, ROUTINE W REFLEX MICROSCOPIC - Abnormal; Notable for the following:    APPearance CLOUDY (*)    Hgb urine dipstick TRACE (*)    Ketones, ur 15 (*)    Protein, ur 100 (*)    Leukocytes, UA LARGE (*)    All other components within normal limits  URINE MICROSCOPIC-ADD ON - Abnormal; Notable for the following:    Bacteria, UA MANY (*)    All other components within normal limits  URINE CULTURE  MRSA PCR SCREENING  TROPONIN I  COMPREHENSIVE METABOLIC PANEL  CBC  MAGNESIUM   Imaging Review Dg Chest 2 View  07/25/2013   CLINICAL DATA:  Hypertension and atrial fibrillation  EXAM: CHEST  2 VIEW  COMPARISON:  08/07/2012  FINDINGS: Cardiac shadow is enlarged. The lungs are well aerated bilaterally. Mild vascular congestion is noted. No focal infiltrate is seen.  IMPRESSION: Mild vascular congestion.   Electronically Signed   By: Alcide CleverMark  Lukens M.D.   On: 07/25/2013 16:50   Ct Head Wo Contrast  07/25/2013   CLINICAL DATA:  Near syncope  EXAM: CT HEAD WITHOUT CONTRAST  TECHNIQUE: Contiguous axial images were obtained from the base of the skull through the vertex without intravenous contrast.  COMPARISON:  CT 02/17/2013, MRI 02/18/2013.  FINDINGS: Chronic left MCA infarct involving the left frontal temporal lobe. Chronic infarct also noted in the left parietal lobe. Chronic microvascular ischemic changes in the white matter. No acute infarct.  Mixed density but predominantly low-density subdural hematoma on the left has improved since the prior study. This measures up to 13 mm in diameter. 2 mm midline shift to the right has improved since the prior study. No other areas of hemorrhage or mass lesion identified.  IMPRESSION: Atrophy and chronic ischemia.  No acute infarct  Chronic left-sided subdural hematoma has improved from prior studies.   Electronically Signed   By: Marlan Palauharles  Clark M.D.   On: 07/25/2013 16:52      MDM   1. Near syncope   2. Urinary tract  infection   3. Hypertensive urgency   4. Atrial fibrillation, persistent   5. Dementia   6. Seizures   7. Uncontrolled hypertension    Near syncopal episode with generalized weakness and dizziness. Hypertensive. No chest pain or shortness of breath. Nonfocal neuro exam.  CT head shows stable subdural hematoma but is actually improved. EKG shows no arrhythmias. atrial fibrillation is unchanged.  Labs remarkable for UTI. Patient given IV Lopressor for hypertensive urgency. Rocephin started.  No acute neuro deficits noted.  Patient did have episode of nausea and vomiting in the ED.  Abdomen remains soft and nontender. Will admit for near syncope, UTI, hypertensive urgency.    Glynn OctaveStephen Zymeir Salminen, MD 07/25/13 2158

## 2013-07-25 NOTE — Progress Notes (Signed)
Attempted to get report.  No answer 

## 2013-07-25 NOTE — H&P (Signed)
Patient Demographics  [name], is a 80 y.o. male  MRN: [id number]   DOB - 06/26/1919  Admit Date - 07/25/2013  Outpatient Primary MD for the patient is Georgann Housekeeper, MD   With History of -  Past Medical History  Diagnosis Date  . Dementia   . A-fib   . Enlarged prostate   . Thyroid disease     hypothyroid  . Hypertension   . Spinal stenosis, lumbar       Past Surgical History  Procedure Laterality Date  . Eye surgery    . Kidney stones      in for   Chief Complaint  Patient presents with  . Near Syncope     HPI  [name]  is a 80 y.o. male, with history of advanced dementia, lives at home with his wife, he presents today with presyncope, patient is extremely poor historian, it due to his dementia, wife who provided the history, report patient was walking in the hall today, where she reports he was leaning on the wall he was very weak, so she and her son helped him to the floor, they deny any loss of consciousness, and altered mental status, any slurred speech, any focal deficits, any facial droop, as well he denies any head trauma, upon presentation to ED patient has significantly limited her blood pressure more than 200, and her CT head did not show any acute findings beside his chronic subdural hematoma which seems to be improving, as well he has UTI, wife reports that presyncope episode was followed by nausea and vomiting x1 no recurrence, patient is known to have history of A. fib, EKG showing A. fib rate controlled, he is not any anticoagulation it due to his history of subdural hematoma, and recent CVA with hemorrhagic conversion.    Review of Systems    In addition to the HPI above,  Patient is With dementia, can't provide any reliable review of system, please review the history  of present illness for wife's comments.     Social History History  Substance Use Topics  . Smoking status: Former Smoker    Quit date: 04/17/1964  . Smokeless tobacco: Not on file  . Alcohol Use: Yes     Comment: occ     Family History Family History  Problem Relation Age of Onset  . Cancer - Colon Mother   . Stroke Father   . Cancer - Lung Sister   . Stroke Brother      Prior to Admission medications   Medication Sig Start Date End Date Taking? Authorizing Provider  cholecalciferol (VITAMIN D) 1000 UNITS tablet Take 2,000 Units by mouth daily.   Yes Historical Provider, MD  donepezil (ARICEPT) 5 MG tablet Take 5 mg by mouth at bedtime.   Yes Historical Provider, MD  finasteride (PROSCAR) 5 MG tablet Take 5 mg by mouth daily.   Yes Historical Provider, MD  furosemide (LASIX) 20 MG tablet  Take 40 mg by mouth daily.   Yes Historical Provider, MD  levothyroxine (SYNTHROID, LEVOTHROID) 25 MCG tablet Take 25 mcg by mouth daily before breakfast.   Yes Historical Provider, MD  LORazepam (ATIVAN) 0.5 MG tablet Take 0.5 mg by mouth daily.   Yes Historical Provider, MD  metoprolol (LOPRESSOR) 50 MG tablet Take 25 mg by mouth 2 (two) times daily. 07/06/12  Yes Daniel J Angiulli, PA-C  Multiple Vitamins-Minerals (MULTIVITAMIN PO) Take 2 tablets by mouth daily.   Yes Historical Provider, MD  potassium chloride (K-DUR) 10 MEQ tablet Take 10 mEq by mouth daily.   Yes Historical Provider, MD  risperiDONE (RISPERDAL M-TABS) 0.5 MG disintegrating tablet Take 0.5 mg by mouth at bedtime.   Yes Historical Provider, MD  simvastatin (ZOCOR) 40 MG tablet Take 20 mg by mouth every evening. 07/06/12  Yes Daniel J Angiulli, PA-C  tamsulosin (FLOMAX) 0.4 MG CAPS capsule Take 0.4 mg by mouth at bedtime.   Yes Historical Provider, MD    Allergies  Allergen Reactions  . Erythromycin   . Norvasc [Amlodipine Besylate]   . Contrast Media [Iodinated Diagnostic Agents] Rash    Physical  Exam  Vitals  Blood pressure 173/110, pulse 76, temperature 97.6 F (36.4 C), temperature source Oral, resp. rate 21, SpO2 94.00%.   1. General elderly frail male lying in bed in NAD,    2. Normal affect and insight, Not Suicidal or Homicidal, Awake Alert, Oriented X2.  3. patient was not fully cooperative with the physical exam, but he was responding to his wife's request, so it was done to the best of my ability No F.N deficits, ALL C.Nerves Intact, no gross motor deficits in all extremities.  4. Ears and Eyes appear Normal, Conjunctivae clear, PERRLA. Moist Oral Mucosa.  5. Supple Neck, No JVD, No cervical lymphadenopathy appriciated, No Carotid Bruits.  6. Symmetrical Chest wall movement, Good air movement bilaterally, CTAB.  7. RRR, No Gallops, Rubs or Murmurs, No Parasternal Heave.  8. Positive Bowel Sounds, Abdomen Soft, Non tender, No organomegaly appriciated,No rebound -guarding or rigidity.  9.  No Cyanosis, Normal Skin Turgor, No Skin Rash or Bruise.  10. Good muscle tone,  joints appear normal , no effusions, Normal ROM.  11. No Palpable Lymph Nodes in Neck or Axillae    Data Review  CBC  Recent Labs Lab 07/25/13 1550  WBC 6.6  HGB 12.7*  HCT 37.0*  PLT 111*  MCV 98.9  MCH 34.0  MCHC 34.3  RDW 14.2  LYMPHSABS 1.8  MONOABS 0.4  EOSABS 0.1  BASOSABS 0.0   ------------------------------------------------------------------------------------------------------------------  Chemistries   Recent Labs Lab 07/25/13 1550  NA 145  K 3.9  CL 104  CO2 26  GLUCOSE 146*  BUN 28*  CREATININE 1.09  CALCIUM 9.4  AST 17  ALT 11  ALKPHOS 69  BILITOT 1.2   ------------------------------------------------------------------------------------------------------------------ CrCl is unknown because both a height and weight (above a minimum accepted value) are required for this  calculation. ------------------------------------------------------------------------------------------------------------------ No results found for this basename: TSH, T4TOTAL, FREET3, T3FREE, THYROIDAB,  in the last 72 hours   Coagulation profile No results found for this basename: INR, PROTIME,  in the last 168 hours ------------------------------------------------------------------------------------------------------------------- No results found for this basename: DDIMER,  in the last 72 hours -------------------------------------------------------------------------------------------------------------------  Cardiac Enzymes  Recent Labs Lab 07/25/13 1550  TROPONINI <0.30   ------------------------------------------------------------------------------------------------------------------ No components found with this basename: POCBNP,    ---------------------------------------------------------------------------------------------------------------  Urinalysis    Component Value Date/Time  COLORURINE YELLOW 07/25/2013 1656   APPEARANCEUR CLOUDY* 07/25/2013 1656   LABSPEC 1.016 07/25/2013 1656   PHURINE 7.0 07/25/2013 1656   GLUCOSEU NEGATIVE 07/25/2013 1656   HGBUR TRACE* 07/25/2013 1656   BILIRUBINUR NEGATIVE 07/25/2013 1656   KETONESUR 15* 07/25/2013 1656   PROTEINUR 100* 07/25/2013 1656   UROBILINOGEN 0.2 07/25/2013 1656   NITRITE NEGATIVE 07/25/2013 1656   LEUKOCYTESUR LARGE* 07/25/2013 1656    ----------------------------------------------------------------------------------------------------------------  Imaging results:   Dg Chest 2 View  07/25/2013   CLINICAL DATA:  Hypertension and atrial fibrillation  EXAM: CHEST  2 VIEW  COMPARISON:  08/07/2012  FINDINGS: Cardiac shadow is enlarged. The lungs are well aerated bilaterally. Mild vascular congestion is noted. No focal infiltrate is seen.  IMPRESSION: Mild vascular congestion.   Electronically Signed   By: Alcide CleverMark  Lukens M.D.   On:  07/25/2013 16:50   Ct Head Wo Contrast  07/25/2013   CLINICAL DATA:  Near syncope  EXAM: CT HEAD WITHOUT CONTRAST  TECHNIQUE: Contiguous axial images were obtained from the base of the skull through the vertex without intravenous contrast.  COMPARISON:  CT 02/17/2013, MRI 02/18/2013.  FINDINGS: Chronic left MCA infarct involving the left frontal temporal lobe. Chronic infarct also noted in the left parietal lobe. Chronic microvascular ischemic changes in the white matter. No acute infarct.  Mixed density but predominantly low-density subdural hematoma on the left has improved since the prior study. This measures up to 13 mm in diameter. 2 mm midline shift to the right has improved since the prior study. No other areas of hemorrhage or mass lesion identified.  IMPRESSION: Atrophy and chronic ischemia.  No acute infarct  Chronic left-sided subdural hematoma has improved from prior studies.   Electronically Signed   By: Marlan Palauharles  Clark M.D.   On: 07/25/2013 16:52    My personal review of EKG: Rhythm A. fib at 81 beats per minute    Assessment & Plan  Active Problems:   SDH (subdural hematoma)   Dementia   BPH (benign prostatic hyperplasia)   HTN (hypertension)   Atrial fibrillation, persistent   Seizures   Near syncope   UTI (lower urinary tract infection)   Uncontrolled hypertension    1. near syncope: Etiology is most likely related to UTI, versus vasovagal from nausea and vomiting, as well his general weakness most likely attributed to his uncontrolled blood pressure as well, so it can be any of those factors, patient will be admitted to telemetry unit, will have him on gentle hydration, will treat his UTI, will check MRI of brain without contrast in the morning only  due to the fact patient is having history of A. fib with recent CVA and subdural hematoma, but patient does not appear to be having CVA as he does not have any focal deficits.  2-uncontrolled hypertension, continue home  medications including metoprolol was accessed with heart rate control as well. Start him on when necessary hydralazine  3-history of seizures, continue with Keppra  4-dementia: Continue with Aricept  5-UTI: On Rocephin  6_history of subdural hematoma: Appears to be improving on the CT head, avoid all forms of anticoagulation, obtain MRI of brain in the morning  7-chronic A. Fib: Rate controlled, continue with metoprolol, no anticoagulation it due to his histoery of. subdural hematoma and hemorrhagic conversion of CVA   5: Hypothyroidism: Continue with Synthroid DVT Prophylaxis   SCDs , no chemical anticoagulation due  to subdural hematoma  AM Labs Ordered, also please review Full Orders  Family Communication: Admission, patients condition and plan of care including tests being ordered have been discussed with the patient and wife who indicate understanding and agree with the plan and Code Status.  Code Status DO NOT RESUSCITATE, confirmed by wife  Likely DC to home Condition GUARDED    Time spent in minutes :   Batina Dougan M.D on 07/25/2013 at 7:36 PM  Between 7am to 7pm - After 7pm go to [email or web address] - password [email or web address]  And look for the night coverage person covering me after hours  [facility] Office  [phone]

## 2013-07-25 NOTE — Progress Notes (Signed)
Received pt report from ED - Felipa Etherrence, RN.

## 2013-07-25 NOTE — Progress Notes (Signed)
[name] is an 80 y.o. male.   Chief Complaint: Presyncope HPI: Advanced Dementia, A fib, Subdural Hematoma, Recent CVA with Hemorrhagic conversion  Past Medical History  Diagnosis Date  . Dementia   . A-fib   . Enlarged prostate   . Thyroid disease     hypothyroid  . Hypertension   . Spinal stenosis, lumbar     Past Surgical History  Procedure Laterality Date  . Eye surgery    . Kidney stones      Family History  Problem Relation Age of Onset  . Cancer - Colon Mother   . Stroke Father   . Cancer - Lung Sister   . Stroke Brother    Social History:  reports that he quit smoking about 49 years ago. He does not have any smokeless tobacco history on file. He reports that he drinks alcohol. He reports that he does not use illicit drugs.  Allergies:  Allergies  Allergen Reactions  . Erythromycin   . Norvasc [Amlodipine Besylate]   . Contrast Media [Iodinated Diagnostic Agents] Rash    Medications Prior to Admission  Medication Sig Dispense Refill  . cholecalciferol (VITAMIN D) 1000 UNITS tablet Take 2,000 Units by mouth daily.      Marland Kitchen donepezil (ARICEPT) 5 MG tablet Take 5 mg by mouth at bedtime.      . finasteride (PROSCAR) 5 MG tablet Take 5 mg by mouth daily.      . furosemide (LASIX) 20 MG tablet Take 40 mg by mouth daily.      Marland Kitchen levothyroxine (SYNTHROID, LEVOTHROID) 25 MCG tablet Take 25 mcg by mouth daily before breakfast.      . LORazepam (ATIVAN) 0.5 MG tablet Take 0.5 mg by mouth daily.      . metoprolol (LOPRESSOR) 50 MG tablet Take 25 mg by mouth 2 (two) times daily.      . Multiple Vitamins-Minerals (MULTIVITAMIN PO) Take 2 tablets by mouth daily.      . potassium chloride (K-DUR) 10 MEQ tablet Take 10 mEq by mouth daily.      . risperiDONE (RISPERDAL M-TABS) 0.5 MG disintegrating tablet Take 0.5 mg by mouth at bedtime.      . simvastatin (ZOCOR) 40 MG tablet Take 20 mg by mouth every evening.      . tamsulosin (FLOMAX) 0.4 MG CAPS capsule Take 0.4 mg  by mouth at bedtime.        Results for orders placed during the hospital encounter of 07/25/13 (from the past 48 hour(s))  CBC WITH DIFFERENTIAL     Status: Abnormal   Collection Time    07/25/13  3:50 PM      Result Value Range   WBC 6.6  4.0 - 10.5 K/uL   RBC 3.74 (*) 4.22 - 5.81 MIL/uL   Hemoglobin 12.7 (*) 13.0 - 17.0 g/dL   HCT 37.0 (*) 39.0 - 52.0 %   MCV 98.9  78.0 - 100.0 fL   MCH 34.0  26.0 - 34.0 pg   MCHC 34.3  30.0 - 36.0 g/dL   RDW 14.2  11.5 - 15.5 %   Platelets 111 (*) 150 - 400 K/uL   Comment: REPEATED TO VERIFY     SPECIMEN CHECKED FOR CLOTS     PLATELETS APPEAR DECREASED     PLATELET COUNT CONFIRMED BY SMEAR   Neutrophils Relative % 67  43 - 77 %   Neutro Abs 4.4  1.7 - 7.7 K/uL   Lymphocytes Relative 26  12 - 46 %   Lymphs Abs 1.8  0.7 - 4.0 K/uL   Monocytes Relative 6  3 - 12 %   Monocytes Absolute 0.4  0.1 - 1.0 K/uL   Eosinophils Relative 1  0 - 5 %   Eosinophils Absolute 0.1  0.0 - 0.7 K/uL   Basophils Relative 0  0 - 1 %   Basophils Absolute 0.0  0.0 - 0.1 K/uL  COMPREHENSIVE METABOLIC PANEL     Status: Abnormal   Collection Time    07/25/13  3:50 PM      Result Value Range   Sodium 145  137 - 147 mEq/L   Potassium 3.9  3.7 - 5.3 mEq/L   Chloride 104  96 - 112 mEq/L   CO2 26  19 - 32 mEq/L   Glucose, Bld 146 (*) 70 - 99 mg/dL   BUN 28 (*) 6 - 23 mg/dL   Creatinine, Ser 1.09  0.50 - 1.35 mg/dL   Calcium 9.4  8.4 - 10.5 mg/dL   Total Protein 6.8  6.0 - 8.3 g/dL   Albumin 3.5  3.5 - 5.2 g/dL   AST 17  0 - 37 U/L   ALT 11  0 - 53 U/L   Alkaline Phosphatase 69  39 - 117 U/L   Total Bilirubin 1.2  0.3 - 1.2 mg/dL   GFR calc non Af Amer 57 (*) >90 mL/min   GFR calc Af Amer 66 (*) >90 mL/min   Comment: (NOTE)     The eGFR has been calculated using the CKD EPI equation.     This calculation has not been validated in all clinical situations.     eGFR's persistently <90 mL/min signify possible Chronic Kidney     Disease.  TROPONIN I     Status:  None   Collection Time    07/25/13  3:50 PM      Result Value Range   Troponin I <0.30  <0.30 ng/mL   Comment:            Due to the release kinetics of cTnI,     a negative result within the first hours     of the onset of symptoms does not rule out     myocardial infarction with certainty.     If myocardial infarction is still suspected,     repeat the test at appropriate intervals.  URINALYSIS, ROUTINE W REFLEX MICROSCOPIC     Status: Abnormal   Collection Time    07/25/13  4:56 PM      Result Value Range   Color, Urine YELLOW  YELLOW   APPearance CLOUDY (*) CLEAR   Specific Gravity, Urine 1.016  1.005 - 1.030   pH 7.0  5.0 - 8.0   Glucose, UA NEGATIVE  NEGATIVE mg/dL   Hgb urine dipstick TRACE (*) NEGATIVE   Bilirubin Urine NEGATIVE  NEGATIVE   Ketones, ur 15 (*) NEGATIVE mg/dL   Protein, ur 100 (*) NEGATIVE mg/dL   Urobilinogen, UA 0.2  0.0 - 1.0 mg/dL   Nitrite NEGATIVE  NEGATIVE   Leukocytes, UA LARGE (*) NEGATIVE  URINE MICROSCOPIC-ADD ON     Status: Abnormal   Collection Time    07/25/13  4:56 PM      Result Value Range   Squamous Epithelial / LPF RARE  RARE   WBC, UA 21-50  <3 WBC/hpf   RBC / HPF 0-2  <3 RBC/hpf   Bacteria, UA MANY (*) RARE  Dg Chest 2 View  07/25/2013   CLINICAL DATA:  Hypertension and atrial fibrillation  EXAM: CHEST  2 VIEW  COMPARISON:  08/07/2012  FINDINGS: Cardiac shadow is enlarged. The lungs are well aerated bilaterally. Mild vascular congestion is noted. No focal infiltrate is seen.  IMPRESSION: Mild vascular congestion.   Electronically Signed   By: Inez Catalina M.D.   On: 07/25/2013 16:50   Ct Head Wo Contrast  07/25/2013   CLINICAL DATA:  Near syncope  EXAM: CT HEAD WITHOUT CONTRAST  TECHNIQUE: Contiguous axial images were obtained from the base of the skull through the vertex without intravenous contrast.  COMPARISON:  CT 02/17/2013, MRI 02/18/2013.  FINDINGS: Chronic left MCA infarct involving the left frontal temporal lobe. Chronic  infarct also noted in the left parietal lobe. Chronic microvascular ischemic changes in the white matter. No acute infarct.  Mixed density but predominantly low-density subdural hematoma on the left has improved since the prior study. This measures up to 13 mm in diameter. 2 mm midline shift to the right has improved since the prior study. No other areas of hemorrhage or mass lesion identified.  IMPRESSION: Atrophy and chronic ischemia.  No acute infarct  Chronic left-sided subdural hematoma has improved from prior studies.   Electronically Signed   By: Franchot Gallo M.D.   On: 07/25/2013 16:52    ROS  Blood pressure 187/90, pulse 89, temperature 98.2 F (36.8 C), temperature source Oral, resp. rate 20, height _0  (1.727 m), weight 81.4 kg (179 lb 7.3 oz), SpO2 94.00%. Physical Exam   Assessment/Plan Pt arrived on floor. Alert and oriented to self. Call bell place within reached. Bed at its lowest position. Wife and son at bedside. Confusion noted. Bp elevated, provider notified. Will continue to monitor.   Mina Marble 07/25/2013, 11:12 PM

## 2013-07-26 ENCOUNTER — Inpatient Hospital Stay (HOSPITAL_COMMUNITY): Payer: Medicare Other

## 2013-07-26 DIAGNOSIS — N400 Enlarged prostate without lower urinary tract symptoms: Secondary | ICD-10-CM

## 2013-07-26 DIAGNOSIS — R040 Epistaxis: Secondary | ICD-10-CM

## 2013-07-26 LAB — COMPREHENSIVE METABOLIC PANEL
ALBUMIN: 3.3 g/dL — AB (ref 3.5–5.2)
ALT: 11 U/L (ref 0–53)
AST: 16 U/L (ref 0–37)
Alkaline Phosphatase: 68 U/L (ref 39–117)
BILIRUBIN TOTAL: 1.2 mg/dL (ref 0.3–1.2)
BUN: 26 mg/dL — AB (ref 6–23)
CHLORIDE: 104 meq/L (ref 96–112)
CO2: 25 mEq/L (ref 19–32)
CREATININE: 1.01 mg/dL (ref 0.50–1.35)
Calcium: 9.3 mg/dL (ref 8.4–10.5)
GFR calc Af Amer: 73 mL/min — ABNORMAL LOW (ref >90)
GFR calc non Af Amer: 63 mL/min — ABNORMAL LOW (ref >90)
Glucose, Bld: 128 mg/dL — ABNORMAL HIGH (ref 70–99)
Potassium: 4.2 mEq/L (ref 3.7–5.3)
Sodium: 144 mEq/L (ref 137–147)
TOTAL PROTEIN: 6.6 g/dL (ref 6.0–8.3)

## 2013-07-26 LAB — CBC
HCT: 37.3 % — ABNORMAL LOW (ref 39.0–52.0)
Hemoglobin: 12.6 g/dL — ABNORMAL LOW (ref 13.0–17.0)
MCH: 33.6 pg (ref 26.0–34.0)
MCHC: 33.8 g/dL (ref 30.0–36.0)
MCV: 99.5 fL (ref 78.0–100.0)
PLATELETS: 136 10*3/uL — AB (ref 150–400)
RBC: 3.75 MIL/uL — AB (ref 4.22–5.81)
RDW: 14.5 % (ref 11.5–15.5)
WBC: 8.6 10*3/uL (ref 4.0–10.5)

## 2013-07-26 LAB — MAGNESIUM: Magnesium: 2.1 mg/dL (ref 1.5–2.5)

## 2013-07-26 MED ORDER — ONDANSETRON HCL 4 MG/2ML IJ SOLN
4.0000 mg | Freq: Four times a day (QID) | INTRAMUSCULAR | Status: DC | PRN
  Administered 2013-07-26: 4 mg via INTRAVENOUS
  Filled 2013-07-26: qty 2

## 2013-07-26 NOTE — Care Management Note (Signed)
    Page 1 of 1   07/29/2013     6:56:24 PM   CARE MANAGEMENT NOTE 07/29/2013  Patient:  Arther DamesARIZZANO,Jurrell S   Account Number:  [id number]  Date Initiated:  07/26/2013  Documentation initiated by:  Letha CapeAYLOR,Cleophas Yoak  Subjective/Objective Assessment:   dx near syncope  admit as observation- lives with spouse.     Action/Plan:   pt eval- recs snf   Anticipated DC Date:  07/29/2013   Anticipated DC Plan:  SKILLED NURSING FACILITY  In-house referral  Clinical Social Worker      DC Planning Services  CM consult      Choice offered to / List presented to:             Status of service:  Completed, signed off Medicare Important Message given?   (If response is "NO", the following Medicare IM given date fields will be blank) Date Medicare IM given:   Date Additional Medicare IM given:    Discharge Disposition:  SKILLED NURSING FACILITY  Per UR Regulation:  Reviewed for med. necessity/level of care/duration of stay  If discussed at Long Length of Stay Meetings, dates discussed:    Comments:  07/26/13 1717 Letha Capeeborah Cina Klumpp RN, BSN [phone] patient lives with spouse, per physical therapy recs snf, CSW referral.

## 2013-07-26 NOTE — Progress Notes (Signed)
Addendum  Patient seen and examined, chart and data base reviewed.  I agree with the above assessment and plan.  For full details please see Mrs. Algis DownsMarianne York PA note.  UTI on Rocephin, follow the culture results.  Near syncope, could be secondary to UTI, uncontrolled hypotension.  Check MRI of the brain. PT recommended SNF.   Clint LippsMutaz A Leaha Cuervo, MD [facility] Pager: [phone] 07/26/2013, 11:58 AM

## 2013-07-26 NOTE — Progress Notes (Signed)
Pt transported to MRI, [employer]. Made aware.

## 2013-07-26 NOTE — Progress Notes (Signed)
Utilization review completed. Aerin Delany, RN, BSN. 

## 2013-07-26 NOTE — Progress Notes (Signed)
Physical Therapy Treatment Patient Details Name: Arther Dameslbert S Rousseau MRN: [id number] DOB: 05/25/1920 Today's Date: 07/26/2013 Time: [id number] PT Time Calculation (min): 11 min  PT Assessment / Plan / Recommendation  History of Present Illness Otilio Miulbert Donald is a 80 y.o. male, with history of advanced dementia, lives at home with his wife.   He presents today with presyncope. Pt has UTI.   PT Comments   Pt appears stronger this afternoon.  Follow Up Recommendations  SNF     Does the patient have the potential to tolerate intense rehabilitation     Barriers to Discharge        Equipment Recommendations  Other (comment) (TBD.)    Recommendations for Other Services    Frequency Min 3X/week   Progress towards PT Goals Progress towards PT goals: Progressing toward goals  Plan Current plan remains appropriate    Precautions / Restrictions Precautions Precautions: Fall   Pertinent Vitals/Pain No c/o's    Mobility  Bed Mobility Bed Mobility: Sit to Supine;Rolling Rolling: Min assist Sit to supine: +2 for physical assistance;Mod assist General bed mobility comments: Assist to bring legs up onto bed and to guide trunk.  Verbal/tactile cues for rolling technique. Transfers Overall transfer level: Needs assistance Equipment used: None [place] Dec(Sara Plus) Transfers: Sit to/from Stand Sit to Stand: +2 physical assistance (with sara plus) Stand pivot transfers: +2 physical assistance (with sara plus) General transfer comment: Pt able to assist with standing with sara plus.  Able to achieve full upright.    Exercises     PT Diagnosis:    PT Problem List:   PT Treatment Interventions:     PT Goals (current goals can now be found in the care plan section)    Visit Information  Last PT Received On: 07/26/13 Assistance Needed: +2 History of Present Illness: Otilio Miulbert Madl is a 80 y.o. male, with history of advanced dementia, lives at home with his wife.   He presents today with  presyncope. Pt has UTI.    Subjective Data      Cognition  Cognition Arousal/Alertness: Awake/alert Behavior During Therapy: Flat affect Overall Cognitive Status: History of cognitive impairments - at baseline    Balance  Balance Sitting-balance support: Feet supported;No upper extremity supported Sitting balance-Leahy Scale: Fair Sitting balance - Comments: Pt sat EOB x 1 minute with supervision  End of Session PT - End of Session Equipment Utilized During Treatment: Oxygen (sara plus) Activity Tolerance: Patient tolerated treatment well Patient left: in bed;with call bell/phone within reach;with bed alarm set;with family/visitor present Nurse Communication: Mobility status;Need for lift equipment   GP     Marchia Diguglielmo 07/26/2013, 4:20 PM  [facility] [phone]

## 2013-07-26 NOTE — Evaluation (Signed)
Physical Therapy Evaluation Patient Details Name: GERGORY BIELLO MRN: [id number] DOB: 21-Jun-1919 Today's Date: 07/26/2013 Time: 4098-1191 PT Time Calculation (min): 29 min  PT Assessment / Plan / Recommendation History of Present Illness  Lewin Pellow is a 80 y.o. male, with history of advanced dementia, lives at home with his wife.   He presents today with presyncope. Pt has UTI.  Clinical Impression  Pt admitted with above. Pt currently with functional limitations due to the deficits listed below (see PT Problem List). Pt requiring much more assistance than previously at home due to weakness. Pt will benefit from skilled PT to increase their independence and safety with mobility to allow discharge to SNF prior to return home.     PT Assessment  Patient needs continued PT services    Follow Up Recommendations  SNF    Does the patient have the potential to tolerate intense rehabilitation      Barriers to Discharge        Equipment Recommendations  Other (comment) (TBD)    Recommendations for Other Services     Frequency Min 3X/week    Precautions / Restrictions Precautions Precautions: Fall Restrictions Weight Bearing Restrictions: No   Pertinent Vitals/Pain No c/o's      Mobility  Bed Mobility Overal bed mobility: Needs Assistance Bed Mobility: Supine to Sit Supine to sit: +2 for physical assistance;Total assist General bed mobility comments: Assist to bring legs off of bed and bring trunk up. Transfers Overall transfer level: Needs assistance Equipment used: Rolling walker (2 wheeled);None (sara plus) Transfers: Sit to/from [employer] Sit to Stand: +2 physical assistance;Max assist Stand pivot transfers: +2 physical assistance (with Huntley Dec Plus) General transfer comment: Stood pt x 2 with rolling walker with +78max A. First attempt unable to fully stand. On second attempt able to come up to stand but unable to perform pivot.  Used Huntley Dec Plus  to perform stand pivot.    Exercises     PT Diagnosis: Difficulty walking;Generalized weakness  PT Problem List: Decreased strength;Decreased activity tolerance;Decreased balance;Decreased mobility;Decreased knowledge of use of DME;Decreased knowledge of precautions PT Treatment Interventions: DME instruction;Gait training;Functional mobility training;Therapeutic exercise;Therapeutic activities;Balance training;Patient/family education     PT Goals(Current goals can be found in the care plan section) Acute Rehab PT Goals Patient Stated Goal: Pt unable to state.  Wife would like pt to get stronger so he can return home. PT Goal Formulation: With family Time For Goal Achievement: 08/02/13 Potential to Achieve Goals: Good  Visit Information  Last PT Received On: 07/26/13 Assistance Needed: +2 History of Present Illness: Dvid Pendry is a 80 y.o. male, with history of advanced dementia, lives at home with his wife.   He presents today with presyncope. Pt has UTI.       Prior Functioning  Home Living Family/patient expects to be discharged to:: Private residence Living Arrangements: Spouse/significant other Available Help at Discharge: Family;Available 24 hours/day Type of Home: House Home Access: Stairs to enter [employer] of Steps: 6-8 Entrance Stairs-Rails: Left;Right Home Layout: Able to live on main level with bedroom/bathroom Home Equipment: None Prior Function Level of Independence: Needs assistance Gait / Transfers Assistance Needed: amb independently without assistive device ADL's / Homemaking Assistance Needed: wife assist with bathing and dressing 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 Bruce in the [employer] Communication Communication: HOH Dominant Hand: Right    Cognition  Cognition Arousal/Alertness: Awake/alert Behavior During Therapy: Flat affect Overall Cognitive Status: History of  cognitive  impairments - at baseline    Extremity/Trunk Assessment Upper Extremity Assessment Upper Extremity Assessment: Generalized weakness Lower Extremity Assessment Lower Extremity Assessment: Generalized weakness   Balance Balance Overall balance assessment: Needs assistance Sitting-balance support: Bilateral upper extremity supported;Feet supported Sitting balance-Leahy Scale: Poor Sitting balance - Comments: Assistance varied from supervision to max A due to rt lean. Able to get pt to prop on lt elbow to help achieve midline. Postural control: Right lateral lean Standing balance support: Bilateral upper extremity supported Standing balance-Leahy Scale: Zero Standing balance comment: Stood fully x 1 for ~15 seconds.  End of Session PT - End of Session Equipment Utilized During Treatment: Gait belt;Oxygen;Other (comment) Huntley Dec(Sara Plus) Activity Tolerance: Patient limited by fatigue Patient left: in chair;with call bell/phone within reach;with chair alarm set;with family/visitor present Nurse Communication: Mobility status;Need for lift equipment  GP     Maleeah Crossman 07/26/2013, 11:24 AM  [facility] [phone]

## 2013-07-26 NOTE — Progress Notes (Signed)
[facility] PROGRESS NOTE  JEMELL TOWN ZOX:[id number] DOB: 01/24/1920 DOA: 07/25/2013 PCP: Georgann Housekeeper, MD   [name] is a 80 y.o. male, with history of advanced dementia, lives at home with his wife.   He presents today with presyncope.  Patient is extremely poor historian, due to his dementia, his wife provided the history.   She reports the patient was walking in the hall today, where he was leaning on the wall he was very weak, so she and her son helped him to the floor, they deny any loss of consciousness, and altered mental status, any slurred speech, any focal deficits, any facial droop, as well he denies any head trauma.  Upon presentation to ED patient has significantly elevated blood pressure more than 200, and CT head did not show any acute findings beside his chronic subdural hematoma which seems to be improving.  He has UTI.  Wife reports that presyncope episode was followed by nausea and vomiting x1 no recurrence, patient is known to have history of A. fib, EKG showing A. fib rate controlled, he is not any anticoagulation it due to his history of subdural hematoma, and recent CVA with hemorrhagic conversion.     Assessment/Plan:  [name] is a patient of Dr. Georgann Housekeeper.  [facility] will assume his care at 7:00 am on 2/7.  Near Syncope Uncertain etiology.  Possibly UTI combined with uncontrolled BP. MRI pending - to eval chronic subdural hematoma for changes. Orthostatics are not positive (normal) PT Evaluation is pending.  Uncontrolled Hypertension Now controlled with home dose metoprolol and a dose of PRN hydralazine Lasix currently being held. Will monitor and adjust medications accordingly.  History of subdural hematoma - now considered chronic. Anticoagulation not ordered. MRI pending  Does not appear worse on CT scan.  UTI  Cultures Pending On Rocephin.  Hx of seizures Continue Keppra  Dementia Stable. On  Aricept  Chronic A. Fib Rate controlled, continue with metoprolol No anticoagulation it due to his histoery of. subdural hematoma and hemorrhagic conversion of CVA   Hypothyroidism Continue with Synthroid    DVT Prophylaxis:  SCDs.  Code Status: DNR Family Communication:  Disposition Plan: Inpatient.  From home with wife.  PT eval pending.  Uncertain if SNF is needed.  Antibiotics: Anti-infectives   Start     Dose/Rate Route Frequency Ordered Stop   07/26/13 1800  cefTRIAXone (ROCEPHIN) 1 g in dextrose 5 % 50 mL IVPB     1 g 100 mL/hr over 30 Minutes Intravenous Every 24 hours 07/25/13 2120 07/29/13 1759   07/25/13 1745  cefTRIAXone (ROCEPHIN) 1 g in dextrose 5 % 50 mL IVPB     1 g 100 mL/hr over 30 Minutes Intravenous  Once 07/25/13 1743 07/25/13 1850        HPI/Subjective: No complains.  Patient A&O, but does not want me to uncross his arms from his chest to examine him.  Objective: Filed Vitals:   07/26/13 0525 07/26/13 0530 07/26/13 0733 07/26/13 0955  BP: 185/104 189/108 133/69   Pulse: 83 93 87 82  Temp:      TempSrc:      Resp: 22 22 18    Height:      Weight:      SpO2: 94% 94% 92%     Intake/Output Summary (Last 24 hours) at 07/26/13 1035 Last data filed at 07/26/13 0910  Gross per 24 hour  Intake    120 ml  Output    900 ml  Net   -780 ml   Filed Weights   07/25/13 2132  Weight: 81.4 kg (179 lb 7.3 oz)    Exam: General: Elderly male, lying in bed.  Arms crossed.  Speaks softly, but is coherent.  HEENT:  PERR, EOMI, Anicteic Sclera, MMM. No pharyngeal erythema or exudates  Neck: Supple, no JVD, no masses  Cardiovascular: irreg irreg, S1 S2 auscultated, no rubs, murmurs or gallops.   Respiratory: Clear to auscultation bilaterally with equal chest rise  Abdomen: Soft, nontender, nondistended, + bowel sounds  Extremities: warm dry without cyanosis clubbing or edema. Will move all 4 when asked. Neuro: AAOx3, cranial nerves grossly intact.  Skin:  Without rashes exudates or nodules.   Psych: Quiet, calm, demented but orientated to person and place.    Data Reviewed: Basic Metabolic Panel:  Recent Labs Lab 07/25/13 1550 07/26/13 0555  NA 145 144  K 3.9 4.2  CL 104 104  CO2 26 25  GLUCOSE 146* 128*  BUN 28* 26*  CREATININE 1.09 1.01  CALCIUM 9.4 9.3  MG  --  2.1   Liver Function Tests:  Recent Labs Lab 07/25/13 1550 07/26/13 0555  AST 17 16  ALT 11 11  ALKPHOS 69 68  BILITOT 1.2 1.2  PROT 6.8 6.6  ALBUMIN 3.5 3.3*   CBC:  Recent Labs Lab 07/25/13 1550 07/26/13 0555  WBC 6.6 8.6  NEUTROABS 4.4  --   HGB 12.7* 12.6*  HCT 37.0* 37.3*  MCV 98.9 99.5  PLT 111* 136*   Cardiac Enzymes:  Recent Labs Lab 07/25/13 1550  TROPONINI <0.30     Recent Results (from the past 240 hour(s))  MRSA PCR SCREENING     Status: None   Collection Time    07/25/13  9:32 PM      Result Value Range Status   MRSA by PCR NEGATIVE  NEGATIVE Final   Comment:            The GeneXpert MRSA Assay (FDA     approved for NASAL specimens     only), is one component of a     comprehensive MRSA colonization     surveillance program. It is not     intended to diagnose MRSA     infection nor to guide or     monitor treatment for     MRSA infections.     Studies: Dg Chest 2 View  07/25/2013   CLINICAL DATA:  Hypertension and atrial fibrillation  EXAM: CHEST  2 VIEW  COMPARISON:  08/07/2012  FINDINGS: Cardiac shadow is enlarged. The lungs are well aerated bilaterally. Mild vascular congestion is noted. No focal infiltrate is seen.  IMPRESSION: Mild vascular congestion.   Electronically Signed   By: Alcide Clever M.D.   On: 07/25/2013 16:50   Ct Head Wo Contrast  07/25/2013   CLINICAL DATA:  Near syncope  EXAM: CT HEAD WITHOUT CONTRAST  TECHNIQUE: Contiguous axial images were obtained from the base of the skull through the vertex without intravenous contrast.  COMPARISON:  CT 02/17/2013, MRI 02/18/2013.  FINDINGS: Chronic left MCA  infarct involving the left frontal temporal lobe. Chronic infarct also noted in the left parietal lobe. Chronic microvascular ischemic changes in the white matter. No acute infarct.  Mixed density but predominantly low-density subdural hematoma on the left has improved since the prior study. This measures up to 13 mm in diameter. 2 mm midline shift to the right has improved since the prior study. No other areas  of hemorrhage or mass lesion identified.  IMPRESSION: Atrophy and chronic ischemia.  No acute infarct  Chronic left-sided subdural hematoma has improved from prior studies.   Electronically Signed   By: Marlan Palauharles  Clark M.D.   On: 07/25/2013 16:52    Scheduled Meds: . cefTRIAXone (ROCEPHIN)  IV  1 g Intravenous Q24H  . cholecalciferol  2,000 Units Oral Daily  . donepezil  5 mg Oral QHS  . finasteride  5 mg Oral Daily  . levETIRAcetam  500 mg Oral BID  . levothyroxine  25 mcg Oral QAC breakfast  . metoprolol  25 mg Oral BID  . risperiDONE  0.5 mg Oral QHS  . simvastatin  20 mg Oral QPM  . sodium chloride  3 mL Intravenous Q12H  . tamsulosin  0.4 mg Oral QHS   Continuous Infusions: . sodium chloride 50 mL/hr at 07/25/13 2158    Active Problems:   SDH (subdural hematoma)   Dementia   BPH (benign prostatic hyperplasia)   HTN (hypertension)   Atrial fibrillation, persistent   Seizures   Near syncope   UTI (lower urinary tract infection)   Uncontrolled hypertension   Conley CanalYork, Ayanni Tun L, PA-C  [facility] Pager [phone]. If 7PM-7AM, please contact night-coverage at [email or web address], password [email or web address] 07/26/2013, 10:35 AM  LOS: 1 day

## 2013-07-27 DIAGNOSIS — I639 Cerebral infarction, unspecified: Secondary | ICD-10-CM

## 2013-07-27 DIAGNOSIS — I6350 Cerebral infarction due to unspecified occlusion or stenosis of unspecified cerebral artery: Principal | ICD-10-CM

## 2013-07-27 LAB — BASIC METABOLIC PANEL
BUN: 29 mg/dL — ABNORMAL HIGH (ref 6–23)
CHLORIDE: 107 meq/L (ref 96–112)
CO2: 25 mEq/L (ref 19–32)
Calcium: 9.1 mg/dL (ref 8.4–10.5)
Creatinine, Ser: 1.22 mg/dL (ref 0.50–1.35)
GFR calc Af Amer: 58 mL/min — ABNORMAL LOW (ref >90)
GFR, EST NON AFRICAN AMERICAN: 50 mL/min — AB (ref >90)
GLUCOSE: 106 mg/dL — AB (ref 70–99)
POTASSIUM: 4.4 meq/L (ref 3.7–5.3)
SODIUM: 145 meq/L (ref 137–147)

## 2013-07-27 LAB — CBC
HCT: 35.2 % — ABNORMAL LOW (ref 39.0–52.0)
HEMOGLOBIN: 11.6 g/dL — AB (ref 13.0–17.0)
MCH: 33.6 pg (ref 26.0–34.0)
MCHC: 33 g/dL (ref 30.0–36.0)
MCV: 102 fL — ABNORMAL HIGH (ref 78.0–100.0)
RBC: 3.45 MIL/uL — AB (ref 4.22–5.81)
RDW: 14.7 % (ref 11.5–15.5)
WBC: 8.2 10*3/uL (ref 4.0–10.5)

## 2013-07-27 MED ORDER — METOPROLOL TARTRATE 1 MG/ML IV SOLN
2.5000 mg | Freq: Four times a day (QID) | INTRAVENOUS | Status: DC
  Administered 2013-07-27 – 2013-07-28 (×2): 2.5 mg via INTRAVENOUS
  Filled 2013-07-27 (×5): qty 5

## 2013-07-27 MED ORDER — SODIUM CHLORIDE 0.9 % IV SOLN
500.0000 mg | Freq: Two times a day (BID) | INTRAVENOUS | Status: DC
  Administered 2013-07-27 – 2013-07-28 (×2): 500 mg via INTRAVENOUS
  Filled 2013-07-27 (×3): qty 5

## 2013-07-27 MED ORDER — LEVOTHYROXINE SODIUM 100 MCG IV SOLR
25.0000 ug | Freq: Every day | INTRAVENOUS | Status: DC
  Administered 2013-07-28: 25 ug via INTRAVENOUS
  Filled 2013-07-27 (×2): qty 5

## 2013-07-27 MED ORDER — METOPROLOL TARTRATE 1 MG/ML IV SOLN
12.5000 mg | Freq: Two times a day (BID) | INTRAVENOUS | Status: DC
  Filled 2013-07-27 (×2): qty 15

## 2013-07-27 NOTE — Progress Notes (Signed)
Neurology will be consulted, No further orders.

## 2013-07-27 NOTE — Progress Notes (Signed)
RN spoke to MD Polite about changing all P.O. meds to IV. RN will be placing orders in for IV Keppra.

## 2013-07-27 NOTE — Progress Notes (Signed)
Clinical Social Work Department CLINICAL SOCIAL WORK PLACEMENT NOTE 07/27/2013  Patient:  Brian DamesARIZZANO,Richie S  Account Number:  [id number] Admit date:  07/25/2013  Clinical Social Worker:  Harless NakayamaPOONUM Jkai Arwood, LCSWA  Date/time:  07/27/2013 09:45 AM  Clinical Social Work is seeking post-discharge placement for this patient at the following level of care:   SKILLED NURSING   (*CSW will update this form in Epic as items are completed)   07/27/2013  Patient/family provided with [facility] of Clinical Social Work's list of facilities offering this level of care within the geographic area requested by the patient (or if unable, by the patient's family).  07/27/2013  Patient/family informed of their freedom to choose among providers that offer the needed level of care, that participate in Medicare, Medicaid or managed care program needed by the patient, have an available bed and are willing to accept the patient.  07/27/2013  Patient/family informed of [facility]' ownership interest in [facility], as well as of the fact that they are under no obligation to receive care at this facility.  PASARR submitted to EDS on  EXISTING PASARR number received from EDS on   FL2 transmitted to all facilities in geographic area requested by pt/family on  07/27/2013 FL2 transmitted to all facilities within larger geographic area on   Patient informed that his/her managed care company has contracts with or will negotiate with  certain facilities, including the following:     Patient/family informed of bed offers received:   Patient chooses bed at  Physician recommends and patient chooses bed at    Patient to be transferred to  on   Patient to be transferred to facility by   The following physician request were entered in Epic:   Additional CommentsSharol Harness:  Laurabeth Yip, LCSWA [phone]

## 2013-07-27 NOTE — Evaluation (Signed)
SLP Cancellation Note  Patient Details Name: [name] MRN: [id number] DOB: 05/20/1920   Cancelled treatment:       Reason Eval/Treat Not Completed:  order for MBS not received yet - SLP called MD office at 1150- Now can not complete order today, will need order for MBS to be completed on 07/28/13. MD please order MBS and allow SLP to write diet order after test if agreed.  Thanks.     [name], MS [facility] S[facility] [phone]

## 2013-07-27 NOTE — Progress Notes (Signed)
Clinical Social Work Department BRIEF PSYCHOSOCIAL ASSESSMENT 07/27/2013  Patient:  [name],[name]     Account Number:  [id number]     Admit date:  07/25/2013  Clinical Social Worker:  Harless NakayamaAMBELAL,Shun Pletz, LCSWA  Date/Time:  07/27/2013 09:30 AM  Referred by:  Physician  Date Referred:  07/27/2013 Referred for  SNF Placement   Other Referral:   Interview type:  Family Other interview type:   Spoke with pt wife over the phone    PSYCHOSOCIAL DATA Living Status:  WIFE Admitted from facility:   Level of care:   Primary support name:  [name] [phone] Primary support relationship to patient:  SPOUSE Degree of support available:   Pt has good level of support    CURRENT CONCERNS Current Concerns  Post-Acute Placement   Other Concerns:    SOCIAL WORK ASSESSMENT / PLAN CSW aware of PT recommendation for SNF. CSW reviewed chart and saw that pt is not [facility]. CSW called and spoke with pt wife about recommendation. Pt wife informed CSW that pt was previously at [facility] so she is aware of what ST rehab consists of. Pt wife expressed concern for Medicare coverage of SNF. Pt [facility] and I was going to have to start paying". CSW asked how long ago this was and pt wife said it was almost 5 months ago. CSW explained Medicare "60 day wellness" to pt wife. CSW also informed pt wife that faciltiies would check on pt Medicare days when making bed offers and pt wife would be informed by facility of any costs when completeing paperwork. CSW asked if pt wife would be okay with referal being sent to other facilities inc[facility]. Pt wife would prefer [place] SNF if requested facility is not available.   Assessment/plan status:  Psychosocial Support/Ongoing Assessment of Needs Other assessment/ plan:   Information/referral to community resources:   SNF list to be provided with bed offers if necessary     PATIENT'[name]/FAMILY'[name] RESPONSE TO PLAN OF CARE: Pt wife is agreeable to pt going to SNF for ST rehab.       Lalani Winkles, LCSWA [phone]

## 2013-07-27 NOTE — Progress Notes (Signed)
RN received call concerning results for patient's MRI. Radiologist reported that MRI showed a stroke. He asked to speak with the on call provider. NP [facility] was called. [facility] was called too. RN is waiting any further orders.

## 2013-07-27 NOTE — Consult Note (Signed)
Neurology Consultation Reason for Consult: Stroke Referring Physician: Otilio Saber attending)  CC: Stroke  History is obtained from:Patient  HPI: [name] is a 80 y.o. 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 The history is obtained primarily per chart review as the patient has advanced dementia.   This was felt to be due to presyncope in the setting of urinary tract infection, however an MRI was done given his high risk and a cerebellar stroke was found on MRI. Neurology has been consulted for further recommendations.   He is not currently on any antiplatelet or anticoagulation because of his subdural hematoma.    ROS: A 14 point ROS was performed and is negative except as noted in the HPI.  Past Medical History  Diagnosis Date  . Dementia   . A-fib   . Enlarged prostate   . Thyroid disease     hypothyroid  . Hypertension   . Spinal stenosis, lumbar     Family History: Unable to assess secondary to patient's altered mental status.    Social History: Tob: Unable to assess secondary to patient's altered mental status.    Exam: Current vital signs: BP 155/82  Pulse 99  Temp(Src) 98.9 F (37.2 C) (Axillary)  Resp 20  Ht 5\' 8"  (1.727 m)  Wt 81.4 kg (179 lb 7.3 oz)  BMI 27.29 kg/m2  SpO2 93% Vital signs in last 24 hours: Temp:  [98.4 F (36.9 C)-98.9 F (37.2 C)] 98.9 F (37.2 C) (02/06 2030) Pulse Rate:  [79-99] 99 (02/06 2355) Resp:  [18-22] 20 (02/06 2030) BP: (133-189)/(69-108) 155/82 mmHg (02/06 2355) SpO2:  [92 %-94 %] 93 % (02/06 2030)  General: In bed, NAD CV: Irregular Mental Status: Patient is awake, alert, knows that he is in the hospital, but not sure which one. He does not know the year, month, situation. He clearly has significant problems with his memory both immediate and remote. No signs of aphasia or neglect Cranial  Nerves: II: Visual Fields are full. Pupils are equal, round, and reactive to light.  Discs are difficult to visualize. III,IV, VI: EOMI without ptosis or diploplia.  V: Facial sensation is symmetric to temperature VII: Facial movement is symmetric.  VIII: hearing is intact to voice X: Uvula elevates symmetrically XI: Shoulder shrug is symmetric. XII: tongue is midline without atrophy or fasciculations.  Motor: Tone is normal. Bulk is normal. 5/5 strength was in bilateral legs as well as left arm. He has mild weakness in his right arm. Sensory: Sensation is symmetric to light touch Deep Tendon Reflexes: 2+ and symmetric in the biceps and patellae.  Cerebellar: FNF with mild tremor on left, more difficulty on right. Does not perform heel-knee-shin well on either side due to difficulty with following commands. Gait: Not tested secondary to patient safety concerns.   I have reviewed labs in epic and the results pertinent to this consultation are: CMP-mildly low albumin, mildly elevated glucose  I have reviewed the images obtained: MRI brain-left cerebellar infarct  Impression: 80 year old male with a history of a previous left subdural hematoma and stroke who presents with a left cerebellar infarct. This is likely due to atrial fibrillation in the setting of no anticoagulation do to his hemorrhages.  Recommendations: 1. HgbA1c, fasting lipid panel 2. Frequent neuro checks 3. Echocardiogram 4. Prophylactic therapy-I am not sure that he would be the best anticoagulation candidate, especially if he returns  home, however it may be worth discussing with his neurosurgeon tomorrow whether it is safe to start antiplatelets at this time. 5. Risk factor modification 6. Telemetry monitoring 7. PT consult, OT consult, Speech consult    Ritta SlotMcNeill Aastha Dayley, MD [facility] [phone]  If 7pm- 7am, please page neurology on call at [phone].

## 2013-07-27 NOTE — Evaluation (Signed)
Clinical/Bedside Swallow Evaluation Patient Details  Name: [name] MRN: [id number] Date of Birth: 08/19/1919  Today's Date: 07/27/2013 Time: 1100-1130 SLP Time Calculation (min): 30 min  Past Medical History:  Past Medical History  Diagnosis Date  . Dementia   . A-fib   . Enlarged prostate   . Thyroid disease     hypothyroid  . Hypertension   . Spinal stenosis, lumbar    Past Surgical History:  Past Surgical History  Procedure Laterality Date  . Eye surgery    . Kidney stones     HPI:  80 yo male [facility] with [facility], recent falls.  PMH + for CVA in Sept per spouse, dementia, UTI, enlarged prostate, HTN, thyroid dx, afib.  Pt MRI showed cerebellar CVA. Spouse present and reports pt  with no dysphagia prior to admission.    Assessment / Plan / Recommendation Clinical Impression  Pt presents with gross weakness and clinical indications of oropharyngeal dysphagia.  Oral care provided by SLP to clear copious viscous retained secretions.  Note pt with cerebellar CVA indicating concern for sensorimotor dysphagia.  Throat clearing noted throughout all intake concerning for compromised airway protection and coupled with weak cough - asp concerns are high.    SLP explained to wife and pt clinical reasoning for recommending NPO and MBS - to which spouse reported understanding.  Pt reported seeing frame, highway, etc - looking at wall.  Spouse states pt would see "spiritis" even prior to becoming ill.  Decreased ability to follow commands noted.  Recommend MBS to allow instrumental swallow evaluation.      Aspiration Risk    HIGH   Diet Recommendation NPO;NPO except meds (medicine with applesauce, single melted ice chips)        Other  Recommendations   TBD  Follow Up Recommendations    TBD   Frequency and Duration min 2x/week (TBD)  1 week   Pertinent Vitals/Pain Afebrile, decreased     Swallow Study Prior Functional Status   Pt denies h/o dysphagia.  UGI:  12/13/12  esophageal dysmotility, hiatal hernia    General Date of Onset: 07/27/13 HPI: 80 [facility], recent falls.  PMH + for CVA in Sept per spouse, dementia, UTI, enlarged prostate, HTN, thyroid dx, afib.  Pt MRI showed cerebellar CVA. Spouse present and reports pt  with no dysphagia prior to admission.  Type of Study: Bedside swallow evaluation Diet Prior to this Study: NPO Temperature Spikes Noted: No Respiratory Status: Nasal cannula History of Recent Intubation: No Behavior/Cognition: Alert;Agitated;Impulsive;Decreased sustained attention;Doesn't follow directions Oral Cavity - Dentition: Dentures, top (partial lower) Self-Feeding Abilities: Total assist (pt in mitts for protection) Patient Positioning: Upright in chair Baseline Vocal Quality: Clear Volitional Cough: Weak Volitional Swallow: Unable to elicit    Oral/Motor/Sensory Function Overall Oral Motor/Sensory Function: Appears within functional limits for tasks assessed (gross weakness, otherwise no gross cn deficits)   Ice Chips Ice chips: Impaired Oral Phase Impairments: Impaired anterior to posterior transit;Reduced lingual movement/coordination;Reduced labial seal Oral Phase Functional Implications: Prolonged oral transit Pharyngeal Phase Impairments: Suspected delayed Swallow   Thin Liquid Thin Liquid: Impaired Presentation: Spoon Oral Phase Impairments: Impaired anterior to posterior transit;Reduced lingual movement/coordination;Reduced labial seal Oral Phase Functional Implications: Prolonged oral transit Pharyngeal  Phase Impairments: Throat Clearing - Immediate;Multiple swallows    Nectar Thick Nectar Thick Liquid: Impaired Presentation: Cup;Spoon Oral Phase Impairments: Reduced labial seal;Impaired anterior to posterior transit;Reduced lingual movement/coordination Oral phase functional implications: Prolonged oral  transit Pharyngeal Phase Impairments: Suspected delayed Swallow;Throat Clearing -  Delayed;Throat Clearing - Immediate;Multiple swallows   Honey Thick Honey Thick Liquid: Not tested   Puree Puree: Impaired Oral Phase Impairments: Impaired anterior to posterior transit;Reduced lingual movement/coordination Oral Phase Functional Implications: Prolonged oral transit Pharyngeal Phase Impairments: Suspected delayed Swallow;Multiple swallows;Throat Clearing - Delayed   Solid   GO    Solid: Not tested       Donavan Burnet, MS [facility] SLP [phone]

## 2013-07-27 NOTE — Progress Notes (Signed)
Stroke Team Progress Note  HISTORY [name] is a 80 y.o. male with a history of previous hemorrhagic infarct in September of last year as well as a subdural hematoma who presented 07/25/2013 with difficulty walking. The history was obtained primarily per chart review as the patient has advanced dementia. This was felt to be due to presyncope in the setting of urinary tract infection, however an MRI was done given his high risk and a cerebellar stroke was found on MRI. Neurology was consulted for further recommendations. He is not currently on any antiplatelet or anticoagulation because of his subdural hematoma.  No t-PA was given secondary to the subdural hematoma.  SUBJECTIVE No family present. Oriented except day and year. Thirsty but NPO.  OBJECTIVE Most recent Vital Signs: Filed Vitals:   07/26/13 2030 07/26/13 2300 07/26/13 2355 07/27/13 0428  BP: 178/88 173/94 155/82 154/72  Pulse: 83 96 99 78  Temp: 98.9 F (37.2 C)   98.5 F (36.9 C)  TempSrc: Axillary   Oral  Resp: 20   20  Height:      Weight:      SpO2: 93%   98%   CBG (last 3)  No results found for this basename: GLUCAP,  in the last 72 hours  IV Fluid Intake:     MEDICATIONS  . cefTRIAXone (ROCEPHIN)  IV  1 g Intravenous Q24H  . cholecalciferol  2,000 Units Oral Daily  . donepezil  5 mg Oral QHS  . finasteride  5 mg Oral Daily  . levETIRAcetam  500 mg Oral BID  . levothyroxine  25 mcg Oral QAC breakfast  . metoprolol  25 mg Oral BID  . risperiDONE  0.5 mg Oral QHS  . simvastatin  20 mg Oral QPM  . sodium chloride  3 mL Intravenous Q12H  . tamsulosin  0.4 mg Oral QHS   PRN:  acetaminophen, acetaminophen, hydrALAZINE, ondansetron  Diet:  NPO no liquids Activity:  Up with assistance DVT Prophylaxis:  SCDs  CLINICALLY SIGNIFICANT STUDIES Basic Metabolic Panel:  Recent Labs Lab 07/26/13 0555 07/27/13 0540  NA 144 145  K 4.2 4.4  CL 104 107  CO2 25 25  GLUCOSE 128* 106*  BUN 26* 29*   CREATININE 1.01 1.22  CALCIUM 9.3 9.1  MG 2.1  --    Liver Function Tests:  Recent Labs Lab 07/25/13 1550 07/26/13 0555  AST 17 16  ALT 11 11  ALKPHOS 69 68  BILITOT 1.2 1.2  PROT 6.8 6.6  ALBUMIN 3.5 3.3*   CBC:  Recent Labs Lab 07/25/13 1550 07/26/13 0555 07/27/13 0540  WBC 6.6 8.6 8.2  NEUTROABS 4.4  --   --   HGB 12.7* 12.6* 11.6*  HCT 37.0* 37.3* 35.2*  MCV 98.9 99.5 102.0*  PLT 111* 136* PLATELET COUNT CONFIRMED BY SMEAR   Coagulation: No results found for this basename: LABPROT, INR,  in the last 168 hours Cardiac Enzymes:  Recent Labs Lab 07/25/13 1550  TROPONINI <0.30   Urinalysis:  Recent Labs Lab 07/25/13 1656  COLORURINE YELLOW  LABSPEC 1.016  PHURINE 7.0  GLUCOSEU NEGATIVE  HGBUR TRACE*  BILIRUBINUR NEGATIVE  KETONESUR 15*  PROTEINUR 100*  UROBILINOGEN 0.2  NITRITE NEGATIVE  LEUKOCYTESUR LARGE*   Lipid Panel    Component Value Date/Time   CHOL 148 02/19/2013 0410   TRIG 60 02/19/2013 0410   HDL 53 02/19/2013 0410   CHOLHDL 2.8 02/19/2013 0410   VLDL 12 02/19/2013 0410   LDLCALC 83 02/19/2013  0410   HgbA1C  Lab Results  Component Value Date   HGBA1C 5.3 02/19/2013    Urine Drug Screen:   No results found for this basename: labopia, cocainscrnur, labbenz, amphetmu, thcu, labbarb    Alcohol Level: No results found for this basename: ETH,  in the last 168 hours  Dg Chest 2 View 07/25/2013    Mild vascular congestion.     Ct Head Wo Contrast 07/25/2013    Atrophy and chronic ischemia.  No acute infarct  Chronic left-sided subdural hematoma has improved from prior studies.       Mr Brain Wo Contrast 07/27/2013    1. Restricted diffusion within the inferior left cerebellar hemisphere, compatible with acute ischemic left PICA territory infarct. No evidence of hemorrhagic conversion. 2. Stable size and appearance of subacute to chronic left subdural hematoma with similar 3 mm of left-to-right midline shift. 3. Remote left MCA territory infarct,  unchanged. 4. Atrophy with chronic microvascular ischemic changes, stable.    2D Echocardiogram  pending  Carotid Doppler  02/18/2013 - Bilateral - 1% to 39% ICA stenosis. Vertebral artery flow is antegrade.   EKG  atrial fibrillation ventricular response 81 beats per minute  For complete results please see formal report.   Therapy Recommendations pending  Physical Exam  Mental Status:  Patient is awake, alert, Oriented except day and year. He clearly has significant problems with his memory both immediate and remote.  No signs of aphasia or neglect  Cranial Nerves:  II: Visual Fields are full. Pupils are equal, round, and reactive to light. Discs are difficult to visualize.  III,IV, VI: EOMI without ptosis or diploplia.  V: Facial sensation is symmetric to temperature  VII: Facial movement is symmetric.  VIII: hearing is intact to voice  X: Uvula elevates symmetrically  XI: Shoulder shrug is symmetric.  XII: tongue is midline without atrophy or fasciculations.  Motor:  Tone is normal. Bulk is normal. 5/5 strength was in bilateral legs as well as left arm. He has mild weakness in his right arm.  Sensory:  Sensation is symmetric to light touch  Deep Tendon Reflexes:  2+ and symmetric in the biceps and patellae.  Cerebellar:  FNF with mild tremor on left, more difficulty on right. Does not perform heel-knee-shin well on either side due to difficulty with following commands.  Gait:  Not tested secondary to patient safety concerns.   ASSESSMENT [name] is a 80 y.o. male presenting with difficulty with ambulation. TPA was not given secondary to history of subdural hematoma. An MRI revealed an acute ischemic left PICA territory infarct. Infarct felt to be thrombotic  On no antithrombotics prior to admission. Now on no antithrombotic therapy for secondary stroke prevention secondary to history of subdural hematoma. Patient with resultant right upper extremity paresis.  Work up underway.   History of chronic subdural hematoma  Dementia  Chronic atrial fibrillation  History of hypertension  N.p.o. status  Hospital day # 2  TREATMENT/PLAN  Continue no antithrombotic therapy for secondary stroke prevention secondary to chronic subdural hematoma  Await therapy evaluations  Await 2-D echo  Delton Seeavid Rinehuls PA-C [facility] Pager [phone] 07/27/2013, 12:53 PM  I have personally obtained a history, examined the patient, evaluated imaging results, and formulated the assessment and plan of care. I agree with the above.  Addendum:  Pt has chronic subdural and old LMCA infarct as well as new L cerebellar infarct. I would place him on ASA 81 daily.  I reviewed the chart and imaging  Pauletta Browns

## 2013-07-27 NOTE — Progress Notes (Signed)
Subjective:  admission H&P reviewed, neurology input noted. Patient admitted with near syncopal spell. In the emergency room urinalysis consistent with UTI, CAT scan negative for any acute event. Due to patient's history of A. fib and CVA he underwent MRI which confirmed cerebellar infarct. Currently patient pleasantly confused no apparent distress. There've been no problems overnight per nursing.  Objective: Vital signs in last 24 hours: Temp:  [98.5 F (36.9 C)-98.9 F (37.2 C)] 98.5 F (36.9 C) (02/07 0428) Pulse Rate:  [78-99] 78 (02/07 0428) Resp:  [20] 20 (02/07 0428) BP: (154-178)/(72-99) 154/72 mmHg (02/07 0428) SpO2:  [92 %-98 %] 98 % (02/07 0428) Weight change:  Last BM Date: 07/25/13  Intake/Output from previous day: 02/06 0701 - 02/07 0700 In: 833.3 [P.O.:180; I.V.:603.3; IV Piggyback:50] Out: 750 [Urine:750] Intake/Output this shift: Total I/O In: -  Out: 250 [Urine:250]  General appearance: Pleasantly confused Resp: clear to auscultation bilaterally Cardio: irregularly irregular rhythm Extremities: No edema, hands are in mittens Neurologic: Motor: Grossly moves all extremities  Lab Results:  Results for orders placed during the hospital encounter of 07/25/13 (from the past 24 hour(s))  BASIC METABOLIC PANEL     Status: Abnormal   Collection Time    07/27/13  5:40 AM      Result Value Range   Sodium 145  137 - 147 mEq/L   Potassium 4.4  3.7 - 5.3 mEq/L   Chloride 107  96 - 112 mEq/L   CO2 25  19 - 32 mEq/L   Glucose, Bld 106 (*) 70 - 99 mg/dL   BUN 29 (*) 6 - 23 mg/dL   Creatinine, Ser 1.61  0.50 - 1.35 mg/dL   Calcium 9.1  8.4 - 09.6 mg/dL   GFR calc non Af Amer 50 (*) >90 mL/min   GFR calc Af Amer 58 (*) >90 mL/min  CBC     Status: Abnormal   Collection Time    07/27/13  5:40 AM      Result Value Range   WBC 8.2  4.0 - 10.5 K/uL   RBC 3.45 (*) 4.22 - 5.81 MIL/uL   Hemoglobin 11.6 (*) 13.0 - 17.0 g/dL   HCT 04.5 (*) 40.9 - 81.1 %   MCV 102.0 (*)  78.0 - 100.0 fL   MCH 33.6  26.0 - 34.0 pg   MCHC 33.0  30.0 - 36.0 g/dL   RDW 91.4  78.2 - 95.6 %   Platelets PLATELET COUNT CONFIRMED BY SMEAR  150 - 400 K/uL      Studies/Results: Dg Chest 2 View  07/25/2013   CLINICAL DATA:  Hypertension and atrial fibrillation  EXAM: CHEST  2 VIEW  COMPARISON:  08/07/2012  FINDINGS: Cardiac shadow is enlarged. The lungs are well aerated bilaterally. Mild vascular congestion is noted. No focal infiltrate is seen.  IMPRESSION: Mild vascular congestion.   Electronically Signed   By: Alcide Clever M.D.   On: 07/25/2013 16:50   Ct Head Wo Contrast  07/25/2013   CLINICAL DATA:  Near syncope  EXAM: CT HEAD WITHOUT CONTRAST  TECHNIQUE: Contiguous axial images were obtained from the base of the skull through the vertex without intravenous contrast.  COMPARISON:  CT 02/17/2013, MRI 02/18/2013.  FINDINGS: Chronic left MCA infarct involving the left frontal temporal lobe. Chronic infarct also noted in the left parietal lobe. Chronic microvascular ischemic changes in the white matter. No acute infarct.  Mixed density but predominantly low-density subdural hematoma on the left has improved since the prior study.  This measures up to 13 mm in diameter. 2 mm midline shift to the right has improved since the prior study. No other areas of hemorrhage or mass lesion identified.  IMPRESSION: Atrophy and chronic ischemia.  No acute infarct  Chronic left-sided subdural hematoma has improved from prior studies.   Electronically Signed   By: Marlan Palauharles  Clark M.D.   On: 07/25/2013 16:52   Mr Brain Wo Contrast  07/27/2013   CLINICAL DATA:  Evaluate for stroke or new hemorrhage. History of intracranial hemorrhage  EXAM: MRI HEAD WITHOUT CONTRAST  TECHNIQUE: Multiplanar, multiecho pulse sequences of the brain and surrounding structures were obtained without intravenous contrast.  COMPARISON:  Prior CT from 07/25/2013 as well as MRI from 02/18/2013.  FINDINGS: Study is degraded by motion  artifact.  There is geographic restricted diffusion involving the inferior aspect of the right cerebellar hemisphere (series 5, image 6), most consistent with in acute height the territory infarct. The cerebellar vermis is spared. No abnormal restricted diffusion seen within the brainstem itself. There is no evidence of hemorrhage within this region. The fourth ventricle air is widely patent. There is cortical swelling and edema within this region on T2 and FLAIR weighted sequences.  Subacute to chronic appearing left subdural hematoma is not significantly changed relative to prior CT measuring 1.5 cm in maximal diameter. A layering hematocrit level is seen posteriorly within this collection. There is mild mass effect on the subjacent left cerebral hemisphere with similar 3 mm of left-to-right midline shift. No new intracranial hemorrhage.  Encephalomalacia with heterogeneous T2 signal intensity seen within the anterior left frontal lobe is compatible with remote left MCA territory infarct, unchanged. No mass lesion identified. No hydrocephalus.  The cervicomedullary junction is within normal limits. Pituitary gland is normal. Orbits and optic nerves demonstrate a normal appearance with normal signal intensity.  Paranasal sinuses are largely clear.  No mastoid effusion.  IMPRESSION: 1. Restricted diffusion within the inferior left cerebellar hemisphere, compatible with acute ischemic left PICA territory infarct. No evidence of hemorrhagic conversion. 2. Stable size and appearance of subacute to chronic left subdural hematoma with similar 3 mm of left-to-right midline shift. 3. Remote left MCA territory infarct, unchanged. 4. Atrophy with chronic microvascular ischemic changes, stable. These results were called by telephone at the time of interpretation on 07/27/2013 at 1:17 AM to Clelia Schaumannim Callahan, the neurology nurse practitioner taking care of the patient, who verbally acknowledged these results.   Electronically Signed    By: Rise MuBenjamin  McClintock M.D.   On: 07/27/2013 01:23    Medications:  Prior to Admission:  Prescriptions prior to admission  Medication Sig Dispense Refill  . cholecalciferol (VITAMIN D) 1000 UNITS tablet Take 2,000 Units by mouth daily.      Marland Kitchen. donepezil (ARICEPT) 5 MG tablet Take 5 mg by mouth at bedtime.      . finasteride (PROSCAR) 5 MG tablet Take 5 mg by mouth daily.      . furosemide (LASIX) 20 MG tablet Take 40 mg by mouth daily.      Marland Kitchen. levothyroxine (SYNTHROID, LEVOTHROID) 25 MCG tablet Take 25 mcg by mouth daily before breakfast.      . LORazepam (ATIVAN) 0.5 MG tablet Take 0.5 mg by mouth daily.      . metoprolol (LOPRESSOR) 50 MG tablet Take 25 mg by mouth 2 (two) times daily.      . Multiple Vitamins-Minerals (MULTIVITAMIN PO) Take 2 tablets by mouth daily.      . potassium chloride (K-DUR)  10 MEQ tablet Take 10 mEq by mouth daily.      . risperiDONE (RISPERDAL M-TABS) 0.5 MG disintegrating tablet Take 0.5 mg by mouth at bedtime.      . simvastatin (ZOCOR) 40 MG tablet Take 20 mg by mouth every evening.      . tamsulosin (FLOMAX) 0.4 MG CAPS capsule Take 0.4 mg by mouth at bedtime.       Scheduled: . cefTRIAXone (ROCEPHIN)  IV  1 g Intravenous Q24H  . cholecalciferol  2,000 Units Oral Daily  . donepezil  5 mg Oral QHS  . finasteride  5 mg Oral Daily  . levETIRAcetam  500 mg Oral BID  . levothyroxine  25 mcg Oral QAC breakfast  . metoprolol  25 mg Oral BID  . risperiDONE  0.5 mg Oral QHS  . simvastatin  20 mg Oral QPM  . sodium chloride  3 mL Intravenous Q12H  . tamsulosin  0.4 mg Oral QHS   Continuous:  [id number], acetaminophen, hydrALAZINE, ondansetron  Assessment/Plan: CVA, left cerebellar in a patient with history of CVA, A. fib, chronic subdural hematoma, history of previous hemorrhagic infarct and dementia. Currently patient pleasantly confused, felt to be high-risk for anticoagulation for now we will Continue treatment as outlined by neurology UTI,  hopefully convert oral antibiotics Atrial fibrillation rate control continue current meds no anticoagulation due to #1 Dementia probably at his baseline  Hypertension, better controlled, continue current meds   LOS: 2 days   Aishia Barkey D 07/27/2013, 9:43 AM

## 2013-07-28 ENCOUNTER — Inpatient Hospital Stay (HOSPITAL_COMMUNITY): Payer: Medicare Other

## 2013-07-28 DIAGNOSIS — R55 Syncope and collapse: Secondary | ICD-10-CM

## 2013-07-28 DIAGNOSIS — I359 Nonrheumatic aortic valve disorder, unspecified: Secondary | ICD-10-CM

## 2013-07-28 LAB — URINE CULTURE

## 2013-07-28 MED ORDER — METOPROLOL TARTRATE 25 MG PO TABS
25.0000 mg | ORAL_TABLET | Freq: Two times a day (BID) | ORAL | Status: DC
  Administered 2013-07-28 (×2): 25 mg via ORAL
  Filled 2013-07-28 (×5): qty 1

## 2013-07-28 MED ORDER — LEVETIRACETAM 500 MG PO TABS: 500.0000 mg | ORAL_TABLET | Freq: Two times a day (BID) | ORAL | Status: DC

## 2013-07-28 MED ORDER — CIPROFLOXACIN HCL 250 MG PO TABS
250.0000 mg | ORAL_TABLET | Freq: Two times a day (BID) | ORAL | Status: DC
  Administered 2013-07-28 – 2013-07-29 (×3): 250 mg via ORAL
  Filled 2013-07-28 (×5): qty 1

## 2013-07-28 MED ORDER — METOPROLOL TARTRATE 1 MG/ML IV SOLN: 5.0000 mg | Freq: Four times a day (QID) | INTRAVENOUS | Status: DC

## 2013-07-28 MED ORDER — LEVOTHYROXINE SODIUM 25 MCG PO TABS
25.0000 ug | ORAL_TABLET | Freq: Every day | ORAL | Status: DC
  Administered 2013-07-29: 25 ug via ORAL
  Filled 2013-07-28 (×2): qty 1

## 2013-07-28 MED ORDER — DEXTROSE-NACL 5-0.45 % IV SOLN
INTRAVENOUS | Status: DC
  Administered 2013-07-28: 50 mL/h via INTRAVENOUS
  Administered 2013-07-29: 05:00:00 via INTRAVENOUS

## 2013-07-28 MED ORDER — CIPROFLOXACIN HCL 250 MG PO TABS: 250.0000 mg | ORAL_TABLET | Freq: Two times a day (BID) | ORAL | Status: AC

## 2013-07-28 MED ORDER — LEVETIRACETAM 500 MG PO TABS
500.0000 mg | ORAL_TABLET | Freq: Two times a day (BID) | ORAL | Status: DC
  Administered 2013-07-28 – 2013-07-29 (×2): 500 mg via ORAL
  Filled 2013-07-28 (×3): qty 1

## 2013-07-28 NOTE — Progress Notes (Signed)
Subjective: Awaiting modified barium swallow, patient fail bedside swallowing study. Patient still complained of being very thirsty. No other problems per nursing  Objective: Vital signs in last 24 hours: Temp:  [95 F (35 C)-98.6 F (37 C)] 95 F (35 C) (02/08 0624) Pulse Rate:  [86-101] 91 (02/08 0624) Resp:  [18-19] 18 (02/08 0624) BP: (149-199)/(88-108) 190/100 mmHg (02/08 0624) SpO2:  [93 %-97 %] 95 % (02/08 0624) Weight change:  Last BM Date: 07/25/13  Intake/Output from previous day: 02/07 0701 - 02/08 0700 In: 50 [IV Piggyback:50] Out: 460 [Urine:460] Intake/Output this shift: Total I/O In: -  Out: 300 [Urine:300]  Resp: clear to auscultation bilaterally Cardio: irregularly irregular rhythm Extremities: extremities normal, atraumatic, no cyanosis or edema Neurologic: Motor: Grossly moves all extremities  Lab Results:  No results found for this or any previous visit (from the past 24 hour(s)).    Studies/Results: Mr Sherrin Daisy Contrast  07/27/2013   CLINICAL DATA:  Evaluate for stroke or new hemorrhage. History of intracranial hemorrhage  EXAM: MRI HEAD WITHOUT CONTRAST  TECHNIQUE: Multiplanar, multiecho pulse sequences of the brain and surrounding structures were obtained without intravenous contrast.  COMPARISON:  Prior CT from 07/25/2013 as well as MRI from 02/18/2013.  FINDINGS: Study is degraded by motion artifact.  There is geographic restricted diffusion involving the inferior aspect of the right cerebellar hemisphere (series 5, image 6), most consistent with in acute height the territory infarct. The cerebellar vermis is spared. No abnormal restricted diffusion seen within the brainstem itself. There is no evidence of hemorrhage within this region. The fourth ventricle air is widely patent. There is cortical swelling and edema within this region on T2 and FLAIR weighted sequences.  Subacute to chronic appearing left subdural hematoma is not significantly changed  relative to prior CT measuring 1.5 cm in maximal diameter. A layering hematocrit level is seen posteriorly within this collection. There is mild mass effect on the subjacent left cerebral hemisphere with similar 3 mm of left-to-right midline shift. No new intracranial hemorrhage.  Encephalomalacia with heterogeneous T2 signal intensity seen within the anterior left frontal lobe is compatible with remote left MCA territory infarct, unchanged. No mass lesion identified. No hydrocephalus.  The cervicomedullary junction is within normal limits. Pituitary gland is normal. Orbits and optic nerves demonstrate a normal appearance with normal signal intensity.  Paranasal sinuses are largely clear.  No mastoid effusion.  IMPRESSION: 1. Restricted diffusion within the inferior left cerebellar hemisphere, compatible with acute ischemic left PICA territory infarct. No evidence of hemorrhagic conversion. 2. Stable size and appearance of subacute to chronic left subdural hematoma with similar 3 mm of left-to-right midline shift. 3. Remote left MCA territory infarct, unchanged. 4. Atrophy with chronic microvascular ischemic changes, stable. These results were called by telephone at the time of interpretation on 07/27/2013 at 1:17 AM to Clelia Schaumann, the neurology nurse practitioner taking care of the patient, who verbally acknowledged these results.   Electronically Signed   By: Rise Mu M.D.   On: 07/27/2013 01:23    Medications:  Prior to Admission:  Prescriptions prior to admission  Medication Sig Dispense Refill  . cholecalciferol (VITAMIN D) 1000 UNITS tablet Take 2,000 Units by mouth daily.      Marland Kitchen donepezil (ARICEPT) 5 MG tablet Take 5 mg by mouth at bedtime.      . finasteride (PROSCAR) 5 MG tablet Take 5 mg by mouth daily.      . furosemide (LASIX) 20 MG tablet Take 40 mg  by mouth daily.      Marland Kitchen. levothyroxine (SYNTHROID, LEVOTHROID) 25 MCG tablet Take 25 mcg by mouth daily before breakfast.      .  LORazepam (ATIVAN) 0.5 MG tablet Take 0.5 mg by mouth daily.      . metoprolol (LOPRESSOR) 50 MG tablet Take 25 mg by mouth 2 (two) times daily.      . Multiple Vitamins-Minerals (MULTIVITAMIN PO) Take 2 tablets by mouth daily.      . potassium chloride (K-DUR) 10 MEQ tablet Take 10 mEq by mouth daily.      . risperiDONE (RISPERDAL M-TABS) 0.5 MG disintegrating tablet Take 0.5 mg by mouth at bedtime.      . simvastatin (ZOCOR) 40 MG tablet Take 20 mg by mouth every evening.      . tamsulosin (FLOMAX) 0.4 MG CAPS capsule Take 0.4 mg by mouth at bedtime.       Scheduled: . cefTRIAXone (ROCEPHIN)  IV  1 g Intravenous Q24H  . cholecalciferol  2,000 Units Oral Daily  . donepezil  5 mg Oral QHS  . finasteride  5 mg Oral Daily  . levETIRAcetam  500 mg Intravenous Q12H  . levothyroxine  25 mcg Intravenous QAC breakfast  . metoprolol  2.5 mg Intravenous Q6H  . risperiDONE  0.5 mg Oral QHS  . simvastatin  20 mg Oral QPM  . sodium chloride  3 mL Intravenous Q12H  . tamsulosin  0.4 mg Oral QHS   Continuous: . 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 fib, chronic subdural hematoma, history of previous hemorrhagic infarct and dementia. Currently patient pleasantly confused, felt to be high-risk for anticoagulation for now we will Continue treatment as outlined by neurology . Currently patient n.p.o. we are awaiting modified barium swallow. IV fluids until resumption of by mouth intake UTI, primary shown gram-negative rods continue current antibiotics, hopefully convert oral antibiotics when taken by mouth Atrial fibrillation rate control continue current meds no anticoagulation due to #1  Dementia probably at his baseline  Hypertension, blood pressure elevated, titrate   medication, apparently incorrect dose entered for IV metoprolol    LOS: 3 days   Tashina Credit  D 07/28/2013, 10:02 AM

## 2013-07-28 NOTE — Procedures (Signed)
Objective Swallowing Evaluation: Modified Barium Swallowing Study  Patient Details  Name: [name] MRN: [id number] Date of Birth: [birth year]  Today's Date: 07/28/2013 Time: 1100-1130 SLP Time Calculation (min): 30 min  Past Medical History:  Past Medical History  Diagnosis Date  . Dementia   . A-fib   . Enlarged prostate   . Thyroid disease     hypothyroid  . Hypertension   . Spinal stenosis, lumbar    Past Surgical History:  Past Surgical History  Procedure Laterality Date  . Eye surgery    . Kidney stones     HPI:  [name] is a 80 y.o. male with a history of previous hemorrhagic infarct in September of last year as well as a subdural hematoma who presented 07/25/2013 with difficulty walking. The history was obtained primarily per chart review as the patient has advanced dementia. This was felt to be due to presyncope in the setting of urinary tract infection, however an MRI was done given his high risk and a cerebellar stroke was found on MRI. Neurology was consulted for further recommendations. He is not currently on any antiplatelet or anticoagulation because of his subdural hematoma.  MBS indicated following results of BSE.       Assessment / Plan / Recommendation Clinical Impression  Dysphagia Diagnosis: Moderate oral phase dysphagia;Mild pharyngeal phase dysphagia;Moderate cervical esophageal phase dysphagia Delayed oral transit with all consistencies with moderate lingual pumping resulting in piecemeal swallows.  Several trials of soft solids required as patient would remove solid prior to initiation of oral prep and oral stage.   Pre spill to pyriforms with thin liquid barium by spoon, cup, and straw with penetration to cords before and during  swallow.  Eventual aspiration with thin liquid by cup and straw during and after swallow with delayed, ineffective throat clear and cough.  Cognitively unable to complete strategy of chin tuck.  Brief esophageal sweep  indicates slow bolus transit.   Whole barium tablet contained in  Mid thoracic portion of esophagus requiring second bolus of puree to complete transit.  Noted backflow of puree consistency to cervical esophagus.  Recommend to proceed with dysphagia 2 ( finely chopped) and nectar thick liquids by cup sips only.  Recommend full supervision with all meals as patient requires assist and cueing to complete swallow strategies and comply with aspiration precautions.  Defer diet and liquid upgrade to treating SLP as patient's strength and endurance improves.  Diagnostic treatment completed following study focusing on providing education and recommendations to nursing and caregivers.  ST to continue in acute care setting for diet tolerance and possible advancement.      Treatment Recommendation  Therapy as outlined in treatment plan below    Diet Recommendation Dysphagia 2 (Fine chop);Nectar-thick liquid   Liquid Administration via: Cup;No straw Medication Administration: Crushed with puree Supervision: Patient able to self feed;Staff to assist with self feeding;Full supervision/cueing for compensatory strategies Compensations: Slow rate;Small sips/bites;Multiple dry swallows after each bite/sip Postural Changes and/or Swallow Maneuvers: Out of bed for meals;Seated upright 90 degrees;Upright 30-60 min after meal    Other  Recommendations Oral Care Recommendations: Oral care Q4 per protocol Other Recommendations: Order thickener from pharmacy;Prohibited food (jello, ice cream, thin soups);Remove water pitcher;Clarify dietary restrictions;Have oral suction available   Follow Up Recommendations  Skilled Nursing facility;24 hour supervision/assistance    Frequency and Duration min 2x/week  2 weeks       SLP Swallow Goals  Please refer to Care Plans for listed  goal   General Date of Onset: 07/27/13 HPI: [name] is a 80 y.o. male with a history of previous hemorrhagic infarct in September of  last year as well as a subdural hematoma who presented 07/25/2013 with difficulty walking. The history was obtained primarily per chart review as the patient has advanced dementia. This was felt to be due to presyncope in the setting of urinary tract infection, however an MRI was done given his high risk and a cerebellar stroke was found on MRI. Neurology was consulted for further recommendations. He is not currently on any antiplatelet or anticoagulation because of his subdural hematoma. Type of Study: Modified Barium Swallowing Study Reason for Referral: Objectively evaluate swallowing function Previous Swallow Assessment: BSE 07/27/12 NPO  Diet Prior to this Study: NPO Temperature Spikes Noted: No Respiratory Status: Nasal cannula History of Recent Intubation: No Behavior/Cognition: Alert;Cooperative;Pleasant mood;Confused;Requires cueing;Decreased sustained attention Oral Cavity - Dentition: Dentures, top;Missing dentition Oral Motor / Sensory Function: Impaired - see Bedside swallow eval Self-Feeding Abilities: Able to feed self;Needs assist Patient Positioning: Upright in chair Baseline Vocal Quality: Clear Volitional Cough: Weak Volitional Swallow: Able to elicit Anatomy: Within functional limits Pharyngeal Secretions: Not observed secondary MBS    Reason for Referral Objectively evaluate swallowing function   Oral Phase Oral Preparation/Oral Phase Oral Phase: Impaired Oral - Nectar Oral - Nectar Teaspoon: Weak lingual manipulation;Lingual pumping;Incomplete tongue to palate contact;Lingual/palatal residue;Piecemeal swallowing;Delayed oral transit Oral - Nectar Cup: Weak lingual manipulation;Lingual pumping;Lingual/palatal residue;Delayed oral transit;Piecemeal swallowing;Decreased velopharyngeal closure;Incomplete tongue to palate contact Oral - Thin Oral - Thin Teaspoon: Weak lingual manipulation;Lingual pumping;Piecemeal swallowing;Lingual/palatal residue;Delayed oral transit Oral -  Thin Cup: Weak lingual manipulation;Lingual pumping;Lingual/palatal residue;Piecemeal swallowing;Delayed oral transit Oral - Thin Straw: Weak lingual manipulation;Lingual/palatal residue;Delayed oral transit;Piecemeal swallowing;Lingual pumping;Incomplete tongue to palate contact Oral - Solids Oral - Puree: Weak lingual manipulation;Lingual/palatal residue;Delayed oral transit;Piecemeal swallowing;Lingual pumping;Incomplete tongue to palate contact Oral - Mechanical Soft: Weak lingual manipulation;Lingual/palatal residue;Delayed oral transit;Piecemeal swallowing;Incomplete tongue to palate contact;Lingual pumping Oral - Pill: Weak lingual manipulation;Lingual pumping;Incomplete tongue to palate contact;Lingual/palatal residue;Piecemeal swallowing;Delayed oral transit   Pharyngeal Phase Pharyngeal Phase Pharyngeal Phase: Impaired Pharyngeal - Nectar Pharyngeal - Nectar Teaspoon: Premature spillage to valleculae;Reduced pharyngeal peristalsis;Reduced anterior laryngeal mobility;Reduced laryngeal elevation;Reduced epiglottic inversion;Reduced airway/laryngeal closure;Reduced tongue base retraction Pharyngeal - Nectar Cup: Premature spillage to valleculae;Reduced epiglottic inversion;Reduced anterior laryngeal mobility;Reduced laryngeal elevation;Reduced tongue base retraction;Reduced airway/laryngeal closure;Pharyngeal residue - pyriform sinuses Pharyngeal - Thin Pharyngeal - Thin Teaspoon: Premature spillage to pyriform sinuses;Reduced pharyngeal peristalsis;Delayed swallow initiation;Reduced epiglottic inversion;Reduced anterior laryngeal mobility;Reduced tongue base retraction;Reduced airway/laryngeal closure;Pharyngeal residue - pyriform sinuses;Pharyngeal residue - cp segment Pharyngeal - Thin Cup: Premature spillage to pyriform sinuses;Delayed swallow initiation;Reduced pharyngeal peristalsis;Reduced epiglottic inversion;Reduced anterior laryngeal mobility;Reduced laryngeal elevation;Reduced  airway/laryngeal closure;Reduced tongue base retraction;Penetration/Aspiration during swallow;Penetration/Aspiration before swallow;Trace aspiration Penetration/Aspiration details (thin cup): Material enters airway, CONTACTS cords and not ejected out;Material enters airway, passes BELOW cords and not ejected out despite cough attempt by patient Pharyngeal - Thin Straw: Premature spillage to pyriform sinuses;Reduced epiglottic inversion;Reduced anterior laryngeal mobility;Reduced tongue base retraction;Penetration/Aspiration during swallow;Penetration/Aspiration before swallow;Reduced airway/laryngeal closure;Reduced laryngeal elevation;Reduced pharyngeal peristalsis;Delayed swallow initiation;Pharyngeal residue - cp segment;Pharyngeal residue - posterior pharnyx;Trace aspiration Penetration/Aspiration details (thin straw): Material enters airway, CONTACTS cords and not ejected out;Material enters airway, passes BELOW cords and not ejected out despite cough attempt by patient Pharyngeal - Solids Pharyngeal - Puree: Premature spillage to valleculae;Reduced epiglottic inversion;Reduced anterior laryngeal mobility;Reduced laryngeal elevation;Reduced tongue base retraction;Reduced airway/laryngeal closure;Pharyngeal residue - cp segment;Pharyngeal residue - posterior  pharnyx Pharyngeal - Mechanical Soft: Premature spillage to valleculae;Reduced epiglottic inversion;Reduced anterior laryngeal mobility;Reduced laryngeal elevation;Reduced airway/laryngeal closure;Reduced tongue base retraction;Pharyngeal residue - cp segment;Pharyngeal residue - pyriform sinuses  Cervical Esophageal Phase    GO    Cervical Esophageal Phase Cervical Esophageal Phase: Impaired Cervical Esophageal Phase - Solids Puree: Prominent cricopharyngeal segment;Esophageal backflow into cervical esophagus Pill: Prominent cricopharyngeal segment;Esophageal backflow into cervical esophagus        Moreen Fowler MS,  CCC-SLP [phone] [facility] 07/28/2013, 12:11 PM

## 2013-07-28 NOTE — Progress Notes (Signed)
Stroke Team Progress Note  HISTORY [name] is a 80 y.o. male with a history of previous hemorrhagic infarct in September of last year as well as a subdural hematoma who presented 07/25/2013 with difficulty walking. The history was obtained primarily per chart review as the patient has advanced dementia. This was felt to be due to presyncope in the setting of urinary tract infection, however an MRI was done given his high risk and a cerebellar stroke was found on MRI. Neurology was consulted for further recommendations. He is not currently on any antiplatelet or anticoagulation because of his subdural hematoma.  No t-PA was given secondary to the subdural hematoma.  SUBJECTIVE No family present. Oriented except day and year. Thirsty but NPO.  OBJECTIVE Most recent Vital Signs: Filed Vitals:   07/27/13 1321 07/27/13 1428 07/27/13 2139 07/28/13 0624  BP: 199/108 149/88 186/95 190/100  Pulse: 86  101 91  Temp: 98.3 F (36.8 C)  98.6 F (37 C) 95 F (35 C)  TempSrc: Oral  Oral Oral  Resp: 19  18 18   Height:      Weight:      SpO2: 93%  97% 95%   CBG (last 3)  No results found for this basename: GLUCAP,  in the last 72 hours  IV Fluid Intake:     MEDICATIONS  . cefTRIAXone (ROCEPHIN)  IV  1 g Intravenous Q24H  . cholecalciferol  2,000 Units Oral Daily  . donepezil  5 mg Oral QHS  . finasteride  5 mg Oral Daily  . levETIRAcetam  500 mg Intravenous Q12H  . levothyroxine  25 mcg Intravenous QAC breakfast  . metoprolol  2.5 mg Intravenous Q6H  . risperiDONE  0.5 mg Oral QHS  . simvastatin  20 mg Oral QPM  . sodium chloride  3 mL Intravenous Q12H  . tamsulosin  0.4 mg Oral QHS   PRN:  acetaminophen, acetaminophen, hydrALAZINE, ondansetron  Diet:  NPO no liquids Activity:  Up with assistance DVT Prophylaxis:  SCDs  CLINICALLY SIGNIFICANT STUDIES Basic Metabolic Panel:   Recent Labs Lab 07/26/13 0555 07/27/13 0540  NA 144 145  K 4.2 4.4  CL 104 107  CO2 25 25   GLUCOSE 128* 106*  BUN 26* 29*  CREATININE 1.01 1.22  CALCIUM 9.3 9.1  MG 2.1  --    Liver Function Tests:   Recent Labs Lab 07/25/13 1550 07/26/13 0555  AST 17 16  ALT 11 11  ALKPHOS 69 68  BILITOT 1.2 1.2  PROT 6.8 6.6  ALBUMIN 3.5 3.3*   CBC:   Recent Labs Lab 07/25/13 1550 07/26/13 0555 07/27/13 0540  WBC 6.6 8.6 8.2  NEUTROABS 4.4  --   --   HGB 12.7* 12.6* 11.6*  HCT 37.0* 37.3* 35.2*  MCV 98.9 99.5 102.0*  PLT 111* 136* PLATELET COUNT CONFIRMED BY SMEAR   Coagulation: No results found for this basename: LABPROT, INR,  in the last 168 hours Cardiac Enzymes:   Recent Labs Lab 07/25/13 1550  TROPONINI <0.30   Urinalysis:   Recent Labs Lab 07/25/13 1656  COLORURINE YELLOW  LABSPEC 1.016  PHURINE 7.0  GLUCOSEU NEGATIVE  HGBUR TRACE*  BILIRUBINUR NEGATIVE  KETONESUR 15*  PROTEINUR 100*  UROBILINOGEN 0.2  NITRITE NEGATIVE  LEUKOCYTESUR LARGE*   Lipid Panel    Component Value Date/Time   CHOL 148 02/19/2013 0410   TRIG 60 02/19/2013 0410   HDL 53 02/19/2013 0410   CHOLHDL 2.8 02/19/2013 0410   VLDL  12 02/19/2013 0410   LDLCALC 83 02/19/2013 0410   HgbA1C  Lab Results  Component Value Date   HGBA1C 5.3 02/19/2013    Urine Drug Screen:   No results found for this basename: labopia,  cocainscrnur,  labbenz,  amphetmu,  thcu,  labbarb    Alcohol Level: No results found for this basename: ETH,  in the last 168 hours  Dg Chest 2 View 07/25/2013    Mild vascular congestion.     Ct Head Wo Contrast 07/25/2013    Atrophy and chronic ischemia.  No acute infarct  Chronic left-sided subdural hematoma has improved from prior studies.       Mr Brain Wo Contrast 07/27/2013    1. Restricted diffusion within the inferior left cerebellar hemisphere, compatible with acute ischemic left PICA territory infarct. No evidence of hemorrhagic conversion. 2. Stable size and appearance of subacute to chronic left subdural hematoma with similar 3 mm of left-to-right midline  shift. 3. Remote left MCA territory infarct, unchanged. 4. Atrophy with chronic microvascular ischemic changes, stable.    2D Echocardiogram  pending  Carotid Doppler  02/18/2013 - Bilateral - 1% to 39% ICA stenosis. Vertebral artery flow is antegrade.   EKG  atrial fibrillation ventricular response 81 beats per minute  For complete results please see formal report.   Therapy Recommendations pending  Physical Exam  Mental Status:  Patient is awake, alert, Oriented except day and year. He clearly has significant problems with his memory both immediate and remote.  No signs of aphasia or neglect  Cranial Nerves:  II: Visual Fields are full. Pupils are equal, round, and reactive to light. Discs are difficult to visualize.  III,IV, VI: EOMI without ptosis or diploplia.  V: Facial sensation is symmetric to temperature  VII: Facial movement is symmetric.  VIII: hearing is intact to voice  X: Uvula elevates symmetrically  XI: Shoulder shrug is symmetric.  XII: tongue is midline without atrophy or fasciculations.  Motor:  Tone is normal. Bulk is normal. 5/5 strength was in bilateral legs as well as left arm. He has mild weakness in his right arm.  Sensory:  Sensation is symmetric to light touch  Deep Tendon Reflexes:  2+ and symmetric in the biceps and patellae.  Cerebellar:  FNF with mild tremor on left, more difficulty on right. Does not perform heel-knee-shin well on either side due to difficulty with following commands.  Gait:  Not tested secondary to patient safety concerns.   ASSESSMENT [name] is a 80 y.o. male presenting with difficulty with ambulation. TPA was not given secondary to history of subdural hematoma. An MRI revealed an acute ischemic left PICA territory infarct. Infarct felt to be thrombotic  On no antithrombotics prior to admission. Now on no antithrombotic therapy for secondary stroke prevention secondary to history of subdural hematoma. Patient  with resultant right upper extremity paresis. Work up underway.   History of chronic subdural hematoma  Dementia  Chronic atrial fibrillation  History of hypertension  N.p.o. Status as failed speech eval.    Hospital day # 3  TREATMENT/PLAN  Continue no antithrombotic therapy for secondary stroke prevention secondary to chronic subdural hematoma  Start rectal ASA  Keppra IV  Confusion likely due to UTI  Await therapy evaluations  Await 2-D echo  MBS tomorrow as failed bedside swallow eval  Discussed plan with nursing staff.     07/28/2013, 9:42 AM  I have personally obtained a history, examined the patient, evaluated imaging  results, and formulated the assessment and plan of care. I agree with the above.  Pauletta Browns

## 2013-07-28 NOTE — Progress Notes (Signed)
  Echocardiogram 2D Echocardiogram has been performed.  [name] [name] 07/28/2013, 3:58 PM

## 2013-07-28 NOTE — Clinical Social Work Note (Signed)
FL2 placed on patient's chart for MD signature.  Bryant Tequisha Maahs, LCSWA, LCASA, [phone] 

## 2013-07-29 MED ORDER — FUROSEMIDE 20 MG PO TABS
20.0000 mg | ORAL_TABLET | Freq: Every day | ORAL | Status: DC
  Administered 2013-07-29: 20 mg via ORAL
  Filled 2013-07-29 (×2): qty 1

## 2013-07-29 MED ORDER — STARCH (THICKENING) PO POWD
ORAL | Status: DC | PRN
  Filled 2013-07-29 (×2): qty 227

## 2013-07-29 MED ORDER — METOPROLOL TARTRATE 50 MG PO TABS
50.0000 mg | ORAL_TABLET | Freq: Two times a day (BID) | ORAL | Status: DC
  Administered 2013-07-29: 50 mg via ORAL
  Filled 2013-07-29 (×2): qty 1

## 2013-07-29 MED ORDER — FUROSEMIDE 20 MG PO TABS: 20.0000 mg | ORAL_TABLET | Freq: Every day | ORAL | Status: AC

## 2013-07-29 MED ORDER — RESOURCE THICKENUP CLEAR PO POWD
ORAL | Status: DC | PRN
  Filled 2013-07-29 (×2): qty 125

## 2013-07-29 MED ORDER — STARCH (THICKENING) PO POWD: 1.0000 g | ORAL | Status: AC | PRN

## 2013-07-29 MED ORDER — RESOURCE THICKENUP CLEAR PO POWD: ORAL | Status: AC

## 2013-07-29 MED ORDER — ASPIRIN EC 81 MG PO TBEC
81.0000 mg | DELAYED_RELEASE_TABLET | Freq: Every day | ORAL | Status: DC
  Administered 2013-07-29: 81 mg via ORAL
  Filled 2013-07-29: qty 1

## 2013-07-29 MED ORDER — POTASSIUM CHLORIDE CRYS ER 10 MEQ PO TBCR
10.0000 meq | EXTENDED_RELEASE_TABLET | Freq: Once | ORAL | Status: AC
  Administered 2013-07-29: 10 meq via ORAL
  Filled 2013-07-29 (×2): qty 1

## 2013-07-29 MED ORDER — ASPIRIN 81 MG PO TBEC: 81.0000 mg | DELAYED_RELEASE_TABLET | Freq: Every day | ORAL | Status: AC

## 2013-07-29 NOTE — Discharge Summary (Signed)
Physician Discharge Summary  Patient ID: [name] MRN: [id number] DOB/AGE: 78/12/1919 78 y.o.  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 Patient had neurology consult: stroke workup- 2-D echo done; because of history of previous hemorrhagic stroke and subdural hematoma, not a candidate for  anticoagulation, baby aspirin. Speech evaluation: dysphagia 2   Diet Underlying history of dementia-moderate problem #2: UTI: cultures positive Klebsiella-anibioticCipro 10 days- culture sensitive Problem #3: hypertension;elvaated blood pressure readings:Patiet's medication was held because of n.p.o.- restarted back on beta blocker and Lasix; monitor blood pressures-may need to add blood pressure medication  if need-keep the blood systolic 160 or below. Problem #4: dementia moderate Problem #5: history of atrial fibrillation control no Coumadin problem #6: BPh: cntinue with Flomax  Consults: neurology  Significant Diagnostic Studies: labs: blood counts normal,creatinine 1.2, potassium4.4,hemoglobin 11.6;latelets 136; UA positive microbiology: blood culture: negative and urine culture: positive for Klebsiella and radiology: CXR: normal, MRI:  acute stroke PICA; pevious subdural stable and CT scan: negative  Treatments: IV hydration, antibiotics: Cipro and anticoagulation: ASA  Discharge Exam: Blood pressure 178/99, pulse 89, temperature 98.7 F (37.1 C), temperature source Oral, resp. rate 18, height 5\' 8"  (1.727 m), weight 81.4 kg (179 lb 7.3 oz), SpO2 94.00%. General appearance: alert Resp: clear to auscultation bilaterally Cardio: regular rate and rhythm  Disposition: 03-Skilled Nursing Facility  Discharge Orders   Future Orders Complete By Expires   Diet - low sodium heart healthy  As directed    Increase activity slowly  As directed        Medication List         aspirin 81 MG EC tablet  Take 1 tablet (81 mg total) by mouth daily.     cholecalciferol 1000 UNITS tablet  Commonly known as:  VITAMIN D  Take 2,000 Units by mouth daily.     ciprofloxacin 250 MG tablet  Commonly known as:  CIPRO  Take 1 tablet (250 mg total) by mouth 2 (two) times daily. FOR 10 DAYS     donepezil 5 MG tablet  Commonly known as:  ARICEPT  Take 5 mg by mouth at bedtime.     finasteride 5 MG tablet  Commonly known as:  PROSCAR  Take 5 mg by mouth daily.     furosemide 20 MG tablet  Commonly known as:  LASIX  Take 1 tablet (20 mg total) by mouth daily.     levothyroxine 25 MCG tablet  Commonly known as:  SYNTHROID, LEVOTHROID  Take 25 mcg by mouth daily before breakfast.     LORazepam 0.5 MG tablet  Commonly known as:  ATIVAN  Take 0.5 mg by mouth daily.     metoprolol 50 MG tablet  Commonly known as:  LOPRESSOR  Take 25 mg by mouth 2 (two) times daily.  MULTIVITAMIN PO  Take 2 tablets by mouth daily.     potassium chloride 10 MEQ tablet  Commonly known as:  K-DUR  Take 10 mEq by mouth daily.     risperiDONE 0.5 MG disintegrating tablet  Commonly known as:  RISPERDAL M-TABS  Take 0.5 mg by mouth at bedtime.     simvastatin 40 MG tablet  Commonly known as:  ZOCOR  Take 20 mg by mouth every evening.      tamsulosin 0.4 MG Caps capsule  Commonly known as:  FLOMAX  Take 0.4 mg by mouth at bedtime.      discharge planning time total: 45 minutes   Signed: Aimy Sweeting 07/29/2013, 8:08 AM

## 2013-07-29 NOTE — Clinical Social Work Placement (Signed)
Clinical Social Work Department CLINICAL SOCIAL WORK PLACEMENT NOTE 07/29/2013  Patient:  [name],[name]  Account Number:  [id number] Admit date:  07/25/2013  Clinical Social Worker:  Harless NakayamaPOONUM AMBELAL, LCSWA  Date/time:  07/27/2013 09:45 AM  Clinical Social Work is seeking post-discharge placement for this patient at the following level of care:   SKILLED NURSING   (*CSW will update this form in Epic as items are completed)   07/27/2013  [facility]/family provided with [facility] of Clinical Social Work'[name] list of facilities offering this level of care within the geographic area requested by the patient (or if unable, by the patient'[name] family).  07/27/2013  Patient/family informed of their freedom to choose among providers that offer the needed level of care, that participate in Medicare, Medicaid or managed care program needed by the patient, have an available bed and are willing to accept the patient.  07/27/2013  Patient/family informed of [facility]' ownership interest in [facility], as well as of the fact that they are under no obligation to receive care at this facility.  PASARR submitted to EDS on  PASARR number received from EDS on   FL2 transmitted to all facilities in geographic area requested by pt/family on  07/27/2013 FL2 transmitted to all facilities within larger geographic area on   Patient informed that his/her managed care company has contracts with or will negotiate with  certain facilities, including the following:     Patient/family informed of bed offers received:  07/22/2013 Patient chooses bed at [facility] Physician recommends and patient chooses bed at    Patient to be transferred to [facility] on  07/29/2013 Patient to be transferred to facility by Ambulance  The following physician request were entered in Epic:   Additional Comments: Per MD patient ready to DC to [facility]. RN, wife, and  facility notified of DC. RN given number for report. DC packet on chart. Ambulance transport requested for patient. CSW signing off.   Roddie McBryant Stefana Lodico, [place]CSWA, [place], [phone]

## 2013-07-29 NOTE — Clinical Documentation Improvement (Signed)
THIS DOCUMENT IS NOT A PERMANENT PART OF THE MEDICAL RECORD  Please update your documentation with the medical record to reflect your response to this query. If you need help knowing how to do this please call [phone].  07/29/13  Dear Dr. Donette LarryHusain Marton [facility]  In an effort to better capture your patient's severity of illness, reflect appropriate length of stay and utilization of resources, a review of the patient medical record has revealed the following indicators.    Based on your clinical judgment, please clarify and document in a progress note and/or discharge summary the clinical condition associated with the following supporting information:  In responding to this query please exercise your independent judgment.  The fact that a query is asked, does not imply that any particular answer is desired or expected.   Possible Clinical Conditions?   " Accelerated Hypertension- yes  " Malignant Hypertension  " Or Other Condition   " Cannot Clinically Determine     Risk Factors: Uncontrolled hypertension noted per 02/09 progress notes.  Signs and Symptoms: SBP range: 170-216 DBP range:  88-101   You may use possible, probable, or suspect with inpatient documentation. Possible, probable, suspected diagnoses MUST be documented at the time of discharge.  Reviewed: additional documentation in the medical record  Thank You,  Marciano SequinWanda Mathews-Bethea,RN,BSN, Clinical Documentation Specialist: [phone] Health Information Management [place]

## 2013-07-29 NOTE — Progress Notes (Signed)
Nsg Discharge Note  Admit Date:  07/25/2013 Discharge date: 07/29/2013   Arther DamesAlbert S Moe to be D/C'd Skilled nursing facility per MD order.  AVS completed.  Copy for chart, and copy for patient signed, and dated. Patient/caregiver able to verbalize understanding.  Discharge Medication:   Medication List         aspirin 81 MG EC tablet  Take 1 tablet (81 mg total) by mouth daily.     cholecalciferol 1000 UNITS tablet  Commonly known as:  VITAMIN D  Take 2,000 Units by mouth daily.     ciprofloxacin 250 MG tablet  Commonly known as:  CIPRO  Take 1 tablet (250 mg total) by mouth 2 (two) times daily. FOR 10 DAYS     donepezil 5 MG tablet  Commonly known as:  ARICEPT  Take 5 mg by mouth at bedtime.     finasteride 5 MG tablet  Commonly known as:  PROSCAR  Take 5 mg by mouth daily.     food thickener Powd  Commonly known as:  THICK IT  Take 1 g by mouth as needed (to thicken liquids).     furosemide 20 MG tablet  Commonly known as:  LASIX  Take 1 tablet (20 mg total) by mouth daily.     levothyroxine 25 MCG tablet  Commonly known as:  SYNTHROID, LEVOTHROID  Take 25 mcg by mouth daily before breakfast.     LORazepam 0.5 MG tablet  Commonly known as:  ATIVAN  Take 0.5 mg by mouth daily.     metoprolol 50 MG tablet  Commonly known as:  LOPRESSOR  Take 25 mg by mouth 2 (two) times daily.     MULTIVITAMIN PO  Take 2 tablets by mouth daily.     potassium chloride 10 MEQ tablet  Commonly known as:  K-DUR  Take 10 mEq by mouth daily.     RESOURCE THICKENUP CLEAR Powd  As directed     risperiDONE 0.5 MG disintegrating tablet  Commonly known as:  RISPERDAL M-TABS  Take 0.5 mg by mouth at bedtime.     simvastatin 40 MG tablet  Commonly known as:  ZOCOR  Take 20 mg by mouth every evening.     tamsulosin 0.4 MG Caps capsule  Commonly known as:  FLOMAX  Take 0.4 mg by mouth at bedtime.        Discharge Assessment: Filed Vitals:   07/29/13 1410  BP: 158/100   Pulse: 80  Temp: 98.9 F (37.2 C)  Resp: 20   Skin clean, dry and intact without evidence of skin break down, no evidence of skin tears noted. Ecchymosis noted bilaterally on upper extremities as well as generalized. IV catheter discontinued intact. Site without signs and symptoms of complications - no redness or edema noted at insertion site, patient denies c/o pain - only slight tenderness at site.  Dressing with slight pressure applied.  D/c Instructions-Education: Discharge instructions given to patient/family with verbalized understanding. D/c education completed with patient/family including follow up instructions, medication list, d/c activities limitations if indicated, with other d/c instructions as indicated by MD - patient able to verbalize understanding, all questions fully answered. Patient instructed to return to ED, call 911, or call MD for any changes in condition.  Patient escorted via WC, and D/C home via private auto.  [place] Nine, Brooke BonitoKrystal A, RN 07/29/2013 6:43 PM

## 2013-07-29 NOTE — Progress Notes (Signed)
Physical Therapy Treatment Patient Details Name: Arther Dameslbert S Dacey MRN: [id number] DOB: 08/20/1919 Today's Date: 07/29/2013 Time: [id number] PT Time Calculation (min): 23 min  PT Assessment / Plan / Recommendation  History of Present Illness Otilio Miulbert Tesoriero is a 80 y.o. male, with history of advanced dementia, lives at home with his wife.   He presents today with presyncope. Pt has UTI.   PT Comments   Pt making slow, steady progress.  Follow Up Recommendations  SNF     Does the patient have the potential to tolerate intense rehabilitation     Barriers to Discharge        Equipment Recommendations  None recommended by PT    Recommendations for Other Services    Frequency Min 2X/week   Progress towards PT Goals Progress towards PT goals: Progressing toward goals  Plan Current plan remains appropriate;Frequency needs to be updated    Precautions / Restrictions Precautions Precautions: Fall Restrictions Weight Bearing Restrictions: No   Pertinent Vitals/Pain VSS    Mobility  Bed Mobility Overal bed mobility: Needs Assistance Bed Mobility: Sit to Supine Supine to sit: +2 for physical assistance;Max assist General bed mobility comments: Assist to bring legs over to EOB and trunk up. Transfers Overall transfer level: Needs assistance Equipment used: Rolling walker (2 wheeled) Transfers: Sit to/from [employer]GI CorporationStand;Stand Pivot Transfers Sit to Stand: +2 physical assistance;Mod assist Stand pivot transfers: +2 physical assistance;Min assist General transfer comment: Assist to bring hips up. Assist to guide rolling walker for pivot to chair. Ambulation/Gait Ambulation/Gait assistance: +2 physical assistance;Mod assist Ambulation Distance (Feet): 3 Feet Assistive device: Rolling walker (2 wheeled) Gait Pattern/deviations: Step-to pattern;Decreased step length - right;Decreased step length - left;Shuffle;Trunk flexed Gait velocity interpretation: Below normal speed for  age/gender General Gait Details: Verbal tactile cues to extend trunk and hips.    Exercises     PT Diagnosis:    PT Problem List:   PT Treatment Interventions:     PT Goals (current goals can now be found in the care plan section)    Visit Information  Last PT Received On: 07/29/13 Assistance Needed: +2 History of Present Illness: Otilio Miulbert Thissen is a 80 y.o. male, with history of advanced dementia, lives at home with his wife.   He presents today with presyncope. Pt has UTI.    Subjective Data      Cognition  Cognition Arousal/Alertness: Awake/alert Behavior During Therapy: Flat affect Overall Cognitive Status: History of cognitive impairments - at baseline    Balance  Balance Overall balance assessment: Needs assistance Sitting-balance support: No upper extremity supported Sitting balance-Leahy Scale: Fair Sitting balance - Comments: Sat EOB x 5 minutes with supervision Standing balance support: Bilateral upper extremity supported Standing balance-Leahy Scale: Poor Standing balance comment: Stood with rolling walker with mod A to maintain.  End of Session PT - End of Session Equipment Utilized During Treatment: Gait belt;Oxygen Activity Tolerance: Patient tolerated treatment well Patient left: in chair;with call bell/phone within reach;with chair alarm set;with family/visitor present Nurse Communication: Mobility status;Need for lift equipment (Likely can pivot back to bed with walker. If difficult recommend Stedy.)   GP     Foxx Klarich 07/29/2013, 12:14 PM  [facility] Crystle Carelli PT [phone]

## 2013-07-29 NOTE — Progress Notes (Signed)
Stroke Team Progress Note  HISTORY [name] is a 80 y.o. male with a history of previous hemorrhagic infarct in September of last year as well as a subdural hematoma who presented 07/25/2013 with difficulty walking. The history was obtained primarily per chart review as the patient has advanced dementia. This was felt to be due to presyncope in the setting of urinary tract infection, however an MRI was done given his high risk and a cerebellar stroke was found on MRI. Neurology was consulted for further recommendations. He is not currently on any antiplatelet or anticoagulation because of his subdural hematoma. No t-PA was given secondary to hx of subdural hematoma.  SUBJECTIVE Wife at bedside.  OBJECTIVE Most recent Vital Signs: Filed Vitals:   07/28/13 2058 07/28/13 2348 07/29/13 0555 07/29/13 1109  BP: 202/101 150/90 178/99 183/96  Pulse: 93  89 101  Temp: 98.9 F (37.2 C)  98.7 F (37.1 C)   TempSrc: Oral  Oral   Resp: 18  18   Height:      Weight:      SpO2: 94%  94%    CBG (last 3)  No results found for this basename: GLUCAP,  in the last 72 hours  IV Fluid Intake:     MEDICATIONS  . aspirin EC  81 mg Oral Daily  . cholecalciferol  2,000 Units Oral Daily  . ciprofloxacin  250 mg Oral BID  . donepezil  5 mg Oral QHS  . finasteride  5 mg Oral Daily  . furosemide  20 mg Oral Daily  . levETIRAcetam  500 mg Oral BID  . levothyroxine  25 mcg Oral QAC breakfast  . metoprolol tartrate  50 mg Oral BID  . risperiDONE  0.5 mg Oral QHS  . simvastatin  20 mg Oral QPM  . sodium chloride  3 mL Intravenous Q12H  . tamsulosin  0.4 mg Oral QHS   PRN:  acetaminophen, acetaminophen, food thickener, hydrALAZINE, ondansetron, RESOURCE THICKENUP CLEAR  Diet:  Dysphagia 2 nectar thick Activity:  Up with assistance DVT Prophylaxis:  SCDs  CLINICALLY SIGNIFICANT STUDIES Basic Metabolic Panel:   Recent Labs Lab 07/26/13 0555 07/27/13 0540  NA 144 145  K 4.2 4.4  CL 104 107   CO2 25 25  GLUCOSE 128* 106*  BUN 26* 29*  CREATININE 1.01 1.22  CALCIUM 9.3 9.1  MG 2.1  --    Liver Function Tests:   Recent Labs Lab 07/25/13 1550 07/26/13 0555  AST 17 16  ALT 11 11  ALKPHOS 69 68  BILITOT 1.2 1.2  PROT 6.8 6.6  ALBUMIN 3.5 3.3*   CBC:   Recent Labs Lab 07/25/13 1550 07/26/13 0555 07/27/13 0540  WBC 6.6 8.6 8.2  NEUTROABS 4.4  --   --   HGB 12.7* 12.6* 11.6*  HCT 37.0* 37.3* 35.2*  MCV 98.9 99.5 102.0*  PLT 111* 136* PLATELET COUNT CONFIRMED BY SMEAR   Coagulation: No results found for this basename: LABPROT, INR,  in the last 168 hours Cardiac Enzymes:   Recent Labs Lab 07/25/13 1550  TROPONINI <0.30   Urinalysis:   Recent Labs Lab 07/25/13 1656  COLORURINE YELLOW  LABSPEC 1.016  PHURINE 7.0  GLUCOSEU NEGATIVE  HGBUR TRACE*  BILIRUBINUR NEGATIVE  KETONESUR 15*  PROTEINUR 100*  UROBILINOGEN 0.2  NITRITE NEGATIVE  LEUKOCYTESUR LARGE*   Lipid Panel    Component Value Date/Time   CHOL 148 02/19/2013 0410   TRIG 60 02/19/2013 0410   HDL 53 02/19/2013  0410   CHOLHDL 2.8 02/19/2013 0410   VLDL 12 02/19/2013 0410   LDLCALC 83 02/19/2013 0410   HgbA1C  Lab Results  Component Value Date   HGBA1C 5.3 02/19/2013    Urine Drug Screen:   No results found for this basename: labopia,  cocainscrnur,  labbenz,  amphetmu,  thcu,  labbarb    Alcohol Level: No results found for this basename: ETH,  in the last 168 hours  Dg Chest 2 View 07/25/2013    Mild vascular congestion.    Ct Head Wo Contrast 07/25/2013    Atrophy and chronic ischemia.  No acute infarct  Chronic left-sided subdural hematoma has improved from prior studies.      Mr Brain Wo Contrast 07/27/2013    1. Restricted diffusion within the inferior left cerebellar hemisphere, compatible with acute ischemic left PICA territory infarct. No evidence of hemorrhagic conversion. 2. Stable size and appearance of subacute to chronic left subdural hematoma with similar 3 mm of left-to-right  midline shift. 3. Remote left MCA territory infarct, unchanged. 4. Atrophy with chronic microvascular ischemic changes, stable.    2D Echocardiogram  EF 50-55% with no source of embolus.  Carotid Doppler  02/18/2013 - Bilateral - 1% to 39% ICA stenosis. Vertebral artery flow is antegrade.  EKG  atrial fibrillation ventricular response 81 beats per minute  For complete results please see formal report.   Therapy Recommendations SNF  Physical Exam  Mental Status:  Patient is drowsy but easily arousable Oriented except day and year. He clearly has significant problems with his memory both immediate and remote.  No signs of aphasia or neglect  Cranial Nerves:  II: Visual Fields are full. Pupils are equal, round, and reactive to light. Discs are difficult to visualize.  III,IV, VI: EOMI without ptosis or diploplia.  V: Facial sensation is symmetric to temperature  VII: Facial movement is symmetric.  VIII: hearing is intact to voice  X: Uvula elevates symmetrically  XI: Shoulder shrug is symmetric.  XII: tongue is midline without atrophy or fasciculations.  Motor:  Tone is normal. Bulk is normal. 5/5 strength was in bilateral legs as well as left arm. He has mild weakness in his right arm.  Sensory:  Sensation is symmetric to light touch  Deep Tendon Reflexes:  2+ and symmetric in the biceps and patellae.  Cerebellar:  FNF with mild tremor on left, more difficulty on right. Does not perform heel-knee-shin well on either side due to difficulty with following commands.  Gait:  Not tested secondary to patient safety concerns.   ASSESSMENT [name] is a 80 y.o. male presenting with difficulty with ambulation. TPA was not given secondary to history of subdural hematoma. An MRI revealed an acute ischemic left PICA territory infarct. Infarct felt to be embolic given hx of known atrial fibrillation.  On no antithrombotics prior to admission. Now on no antithrombotic therapy for  secondary stroke prevention secondary to history of subdural hematoma. Patient with resultant right upper extremity paresis. Work up completed.   History of chronic subdural hematoma  Hemorrhagic infarct 02/2013. D/c to SNF after. Wife did take him home following rehab  Dementia  Chronic atrial fibrillation - Not an anticoagulation candidate secondary to chronic subdural hematoma  History of hypertension  N.p.o. Status as failed speech eval.    Dysphagia  Hospital day # 4  TREATMENT/PLAN  Continue aspirin 81 mg orally every day for secondary stroke prevention.  Agree with plans for SNF  No  further stroke workup indicated. Patient has a 10-15% risk of having another stroke over the next year, the highest risk is within 2 weeks of the most recent stroke/TIA (risk of having a stroke following a stroke or TIA is the same). Ongoing risk factor control by Primary Care Physician Stroke Service will sign off. Please call should any needs arise. Follow up with Dr. Pearlean Brownie, Stroke Clinic, in 2 months.  Annie Main, MSN, RN, ANVP-BC, ANP-BC, [facility] Pager: [phone] 07/29/2013 12:28 PM  I have personally obtained a history, examined the patient, evaluated imaging results, and formulated the assessment and plan of care. I agree with the above.  Delia Heady, MD

## 2013-08-18 DEATH — deceased

## 2013-11-12 ENCOUNTER — Ambulatory Visit: Payer: Medicare Other | Admitting: Nurse Practitioner

## 2014-02-05 ENCOUNTER — Encounter: Payer: Self-pay | Admitting: *Deleted

## 2014-04-30 IMAGING — CT CT HEAD W/O CM
2 series · 16 of 30 positions shown, 18 images · non-contrast
Comparison: CT 02/17/2013, MRI 02/18/2013.

CLINICAL DATA: Near syncope

EXAM:
CT HEAD WITHOUT CONTRAST
TECHNIQUE: Contiguous axial images were obtained from the base of the skull
through the vertex without intravenous contrast.

[Series 2: head w/o · axial · non-contrast · 0.49mm/px · z∈[+94,+224]mm · 8 of 34 slices shown, 10 images]
[im 4/34  brain]
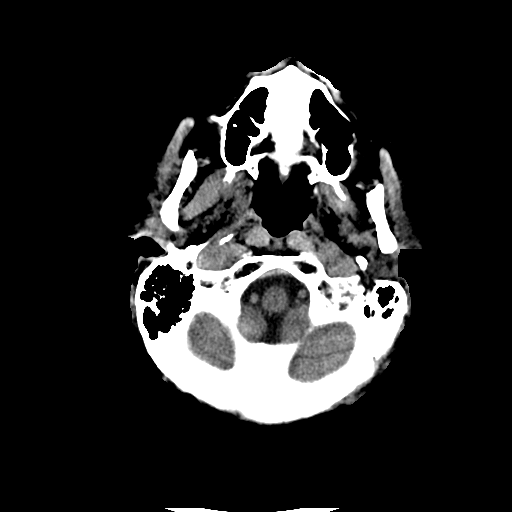
[im 4/34  bone]
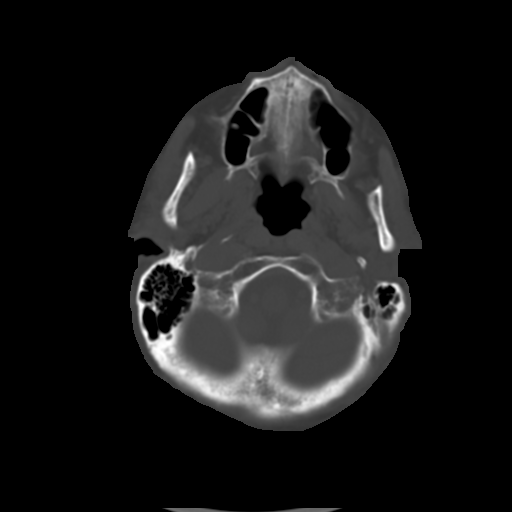
[im 8/34  brain]
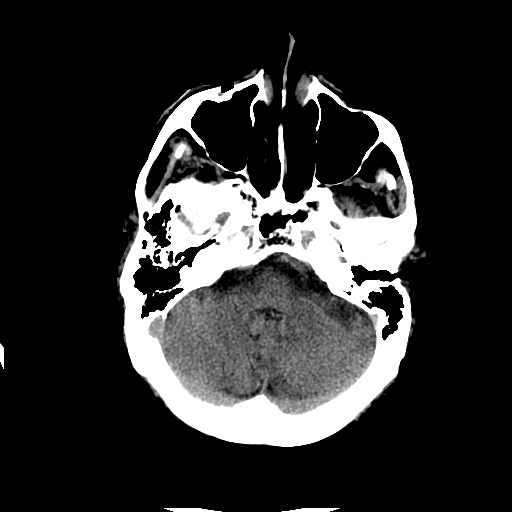
[im 12/34  brain]
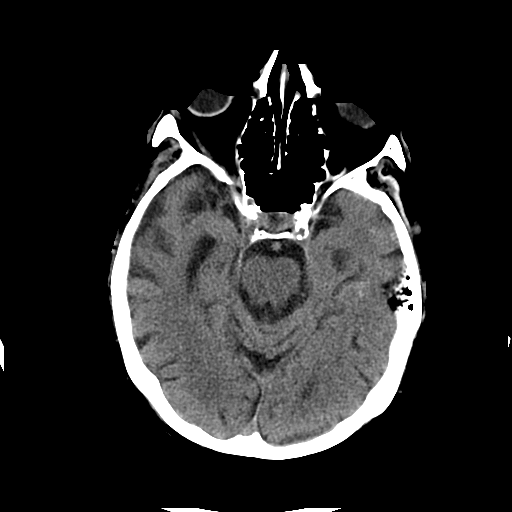
[im 15/34  brain]
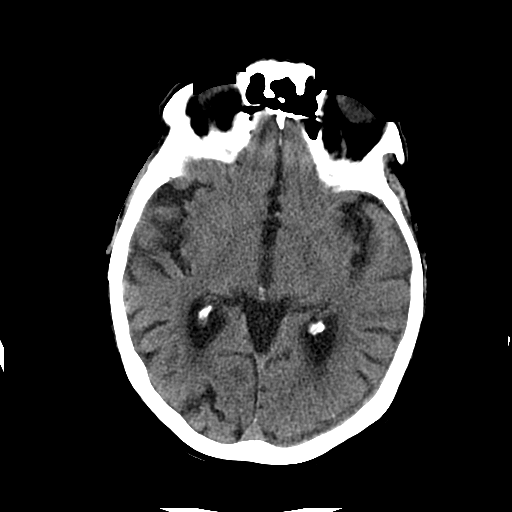
[im 19/34  brain]
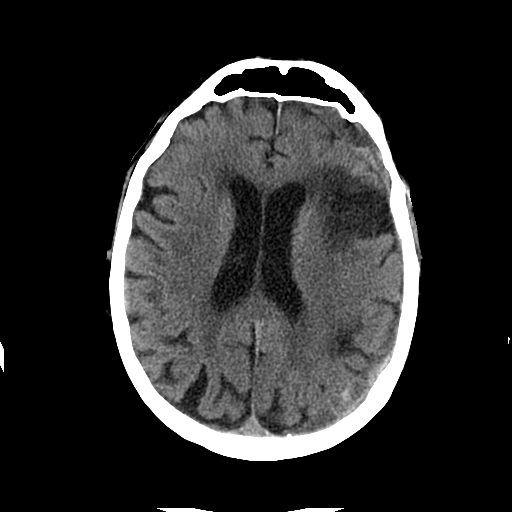
[im 19/34  bone]
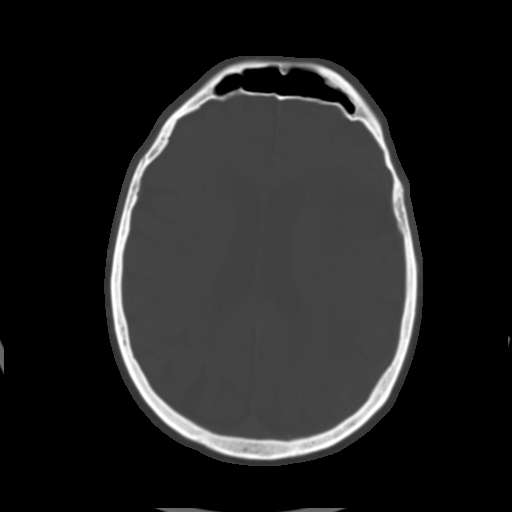
[im 23/34  brain]
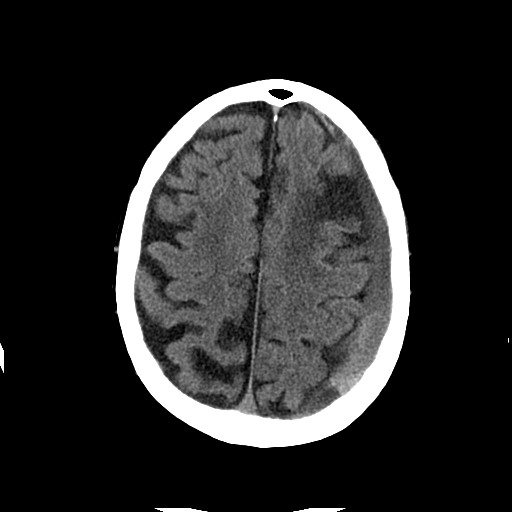
[im 26/34  brain]
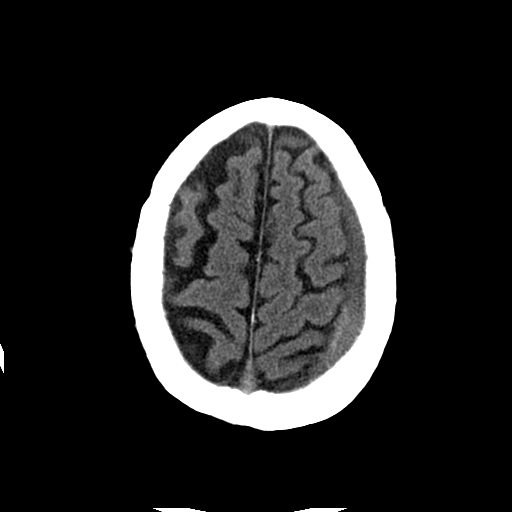
[im 30/34  brain]
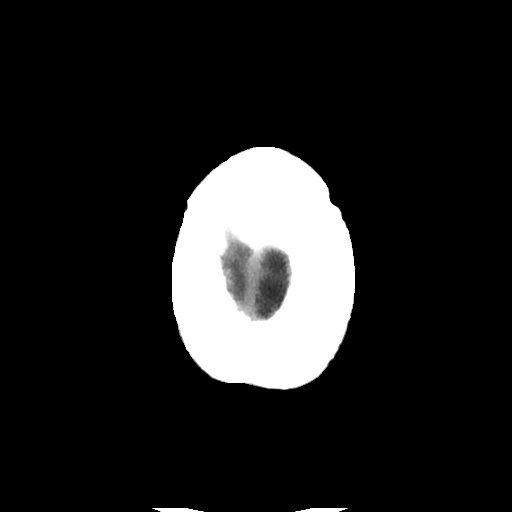

[Series 3: head w/o bone · axial · non-contrast · 0.49mm/px · z∈[+94,+227]mm · 8 of 67 slices shown]
[im 7/67  bone]
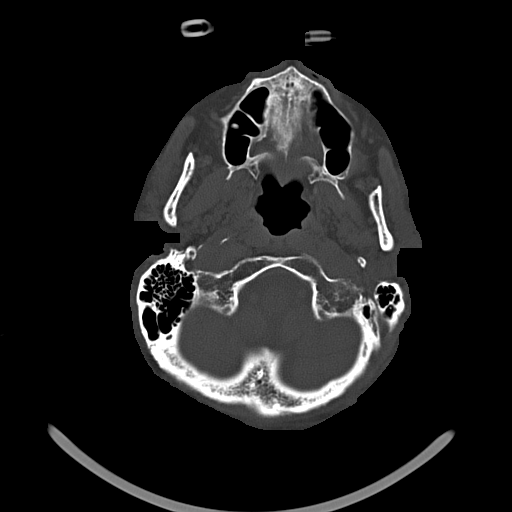
[im 14/67  bone]
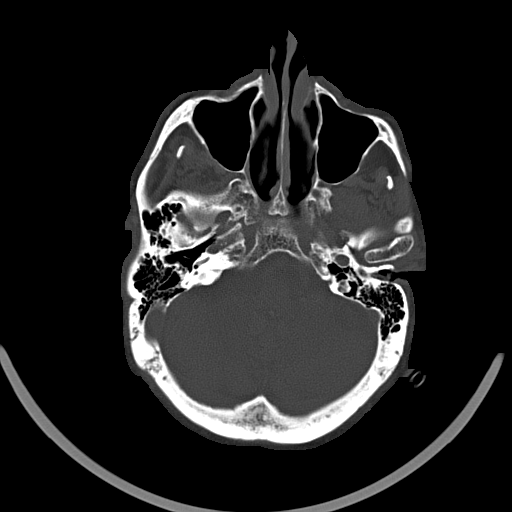
[im 21/67  bone]
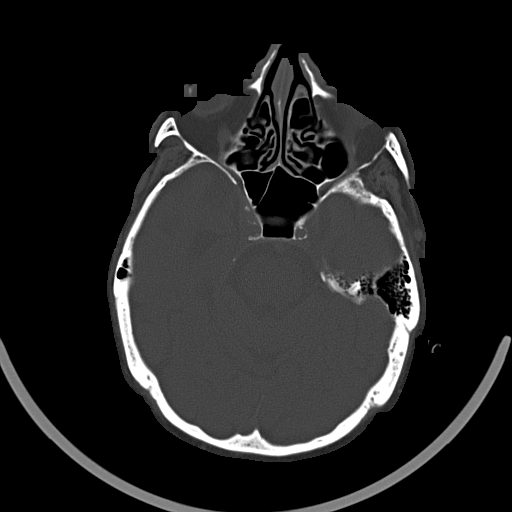
[im 28/67  bone]
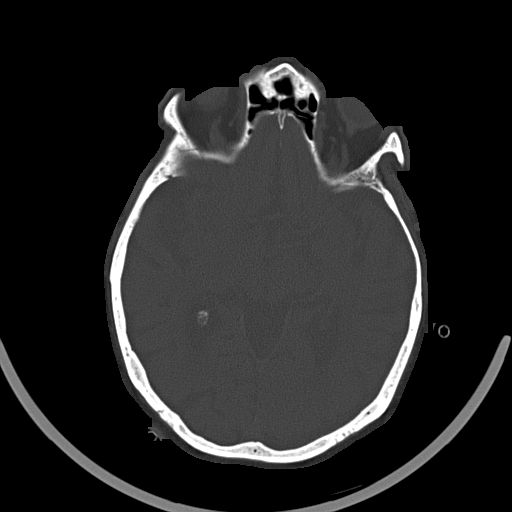
[im 39/67  bone]
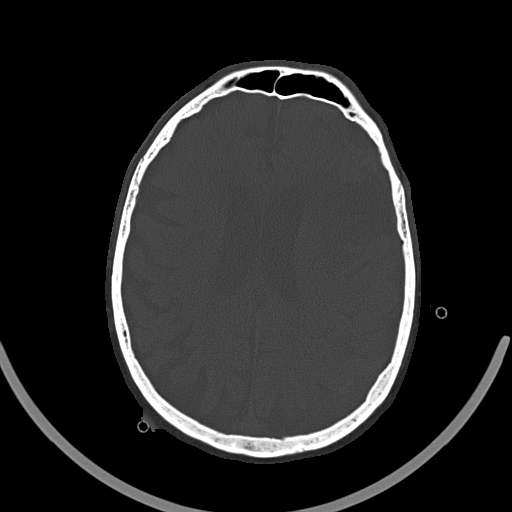
[im 46/67  bone]
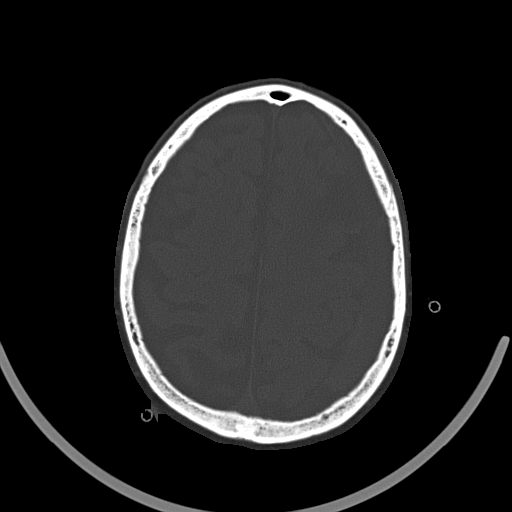
[im 53/67  bone]
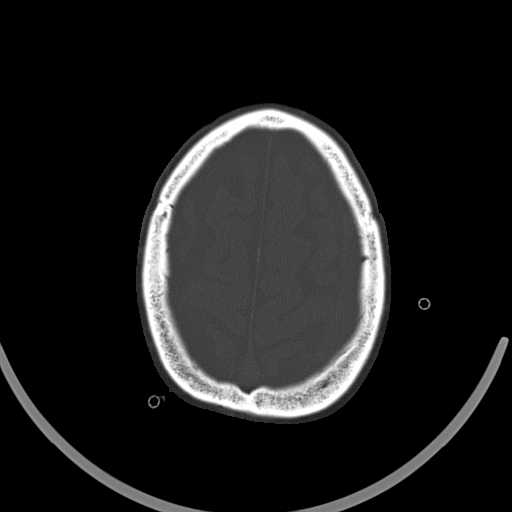
[im 60/67  bone]
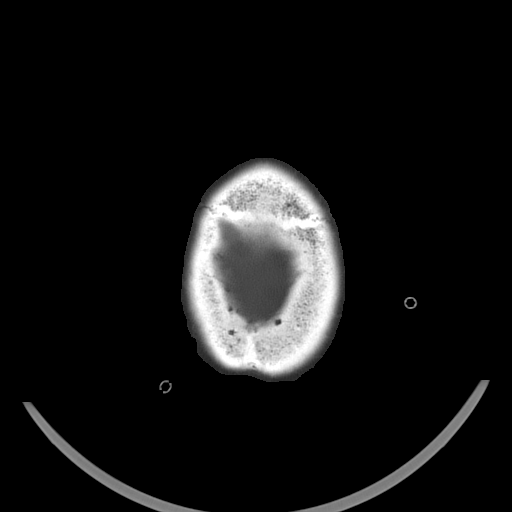

[16 of 30 positions shown; findings below may reference images not displayed]

FINDINGS: Chronic left MCA infarct involving the left frontal temporal lobe.
Chronic infarct also noted in the left parietal lobe. Chronic
microvascular ischemic changes in the white matter. No acute
infarct.

Mixed density but predominantly low-density subdural hematoma on the
left has improved since the prior study. This measures up to 13 mm
in diameter. 2 mm midline shift to the right has improved since the
prior study. No other areas of hemorrhage or mass lesion identified.
IMPRESSION: Atrophy and chronic ischemia.  No acute infarct

Chronic left-sided subdural hematoma has improved from prior
studies.

## 2014-04-30 IMAGING — CR DG CHEST 2V
2 series · 2 of 2 positions shown · non-contrast
Comparison: 08/07/2012

CLINICAL DATA: Hypertension and atrial fibrillation

EXAM:
CHEST  2 VIEW

[w chest lat]
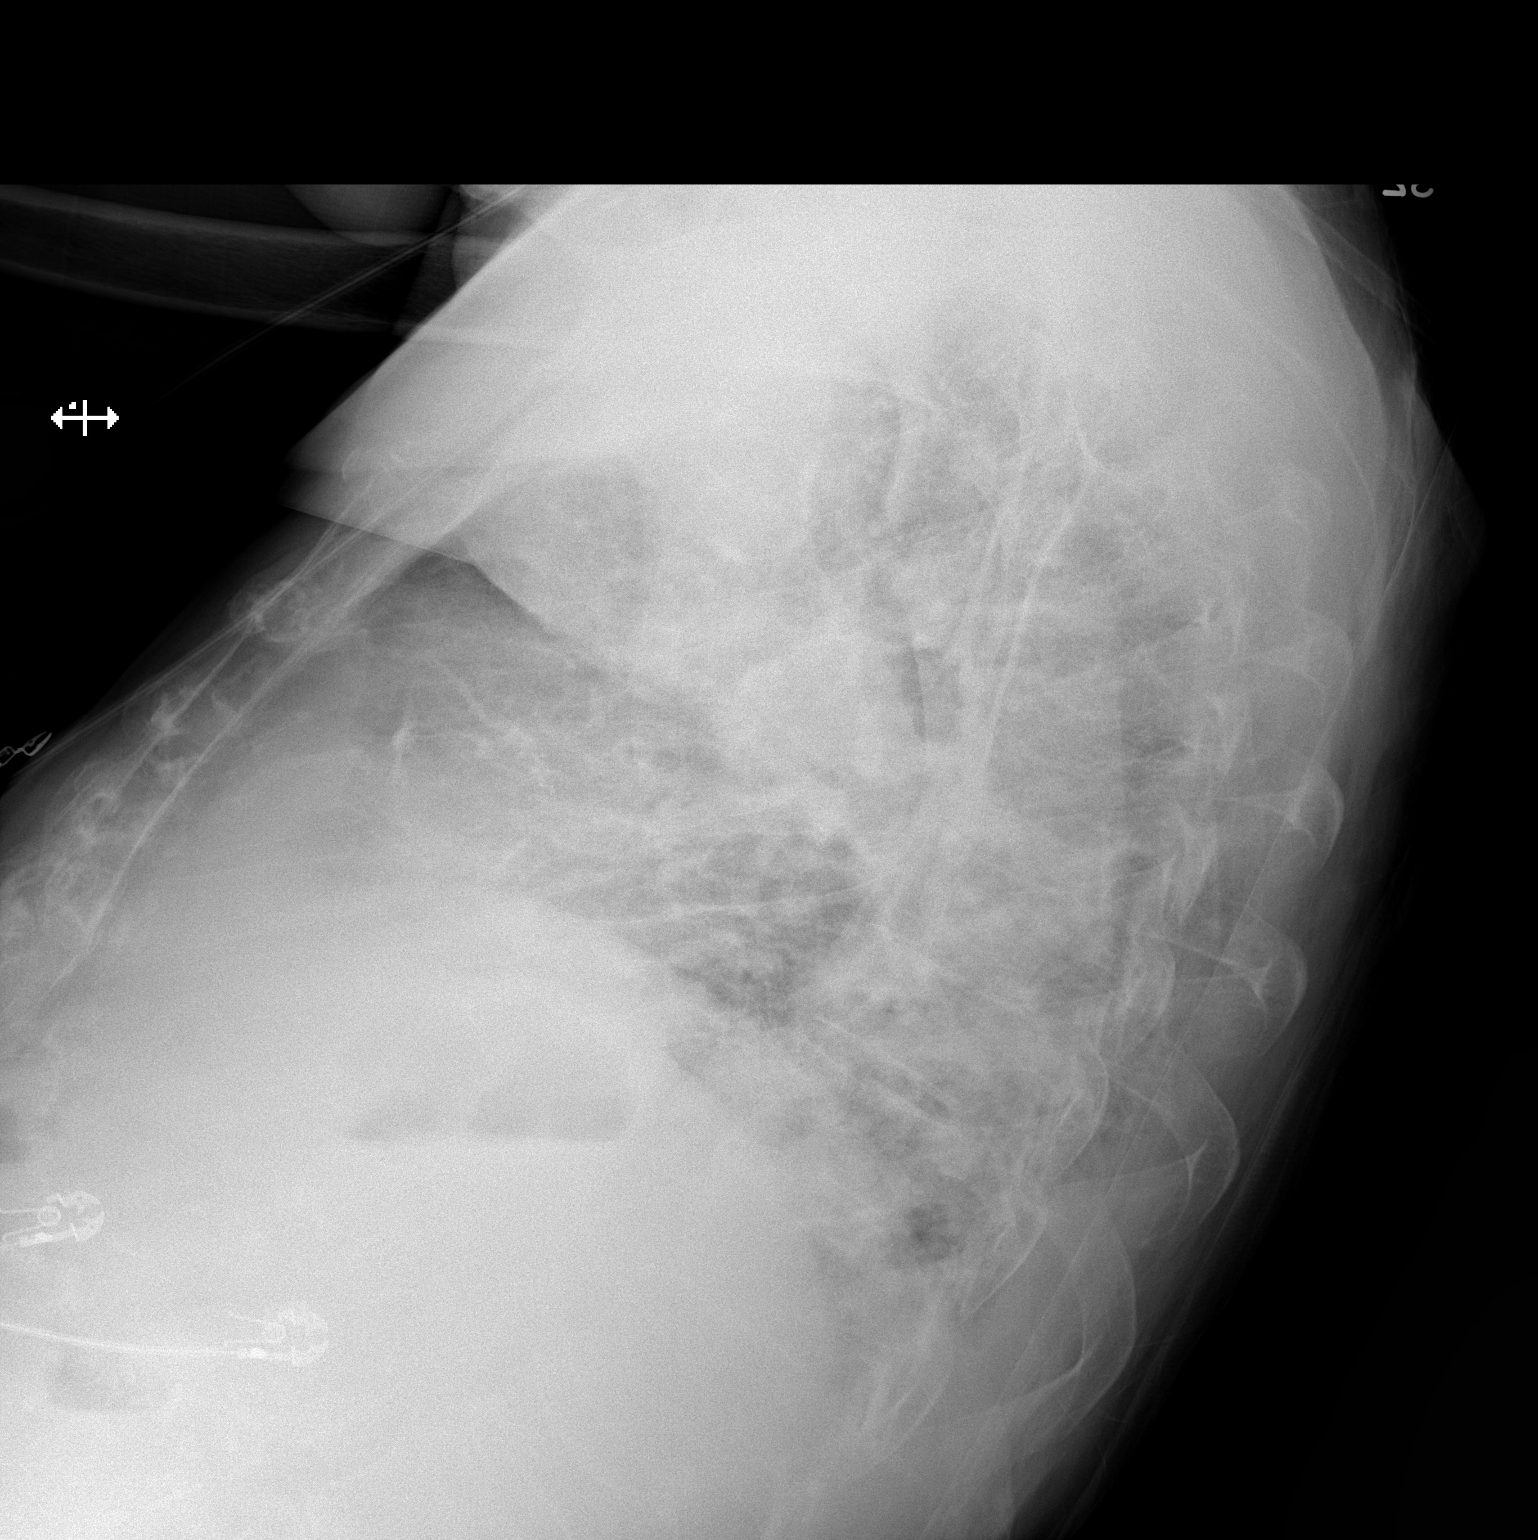

[x chest ap]
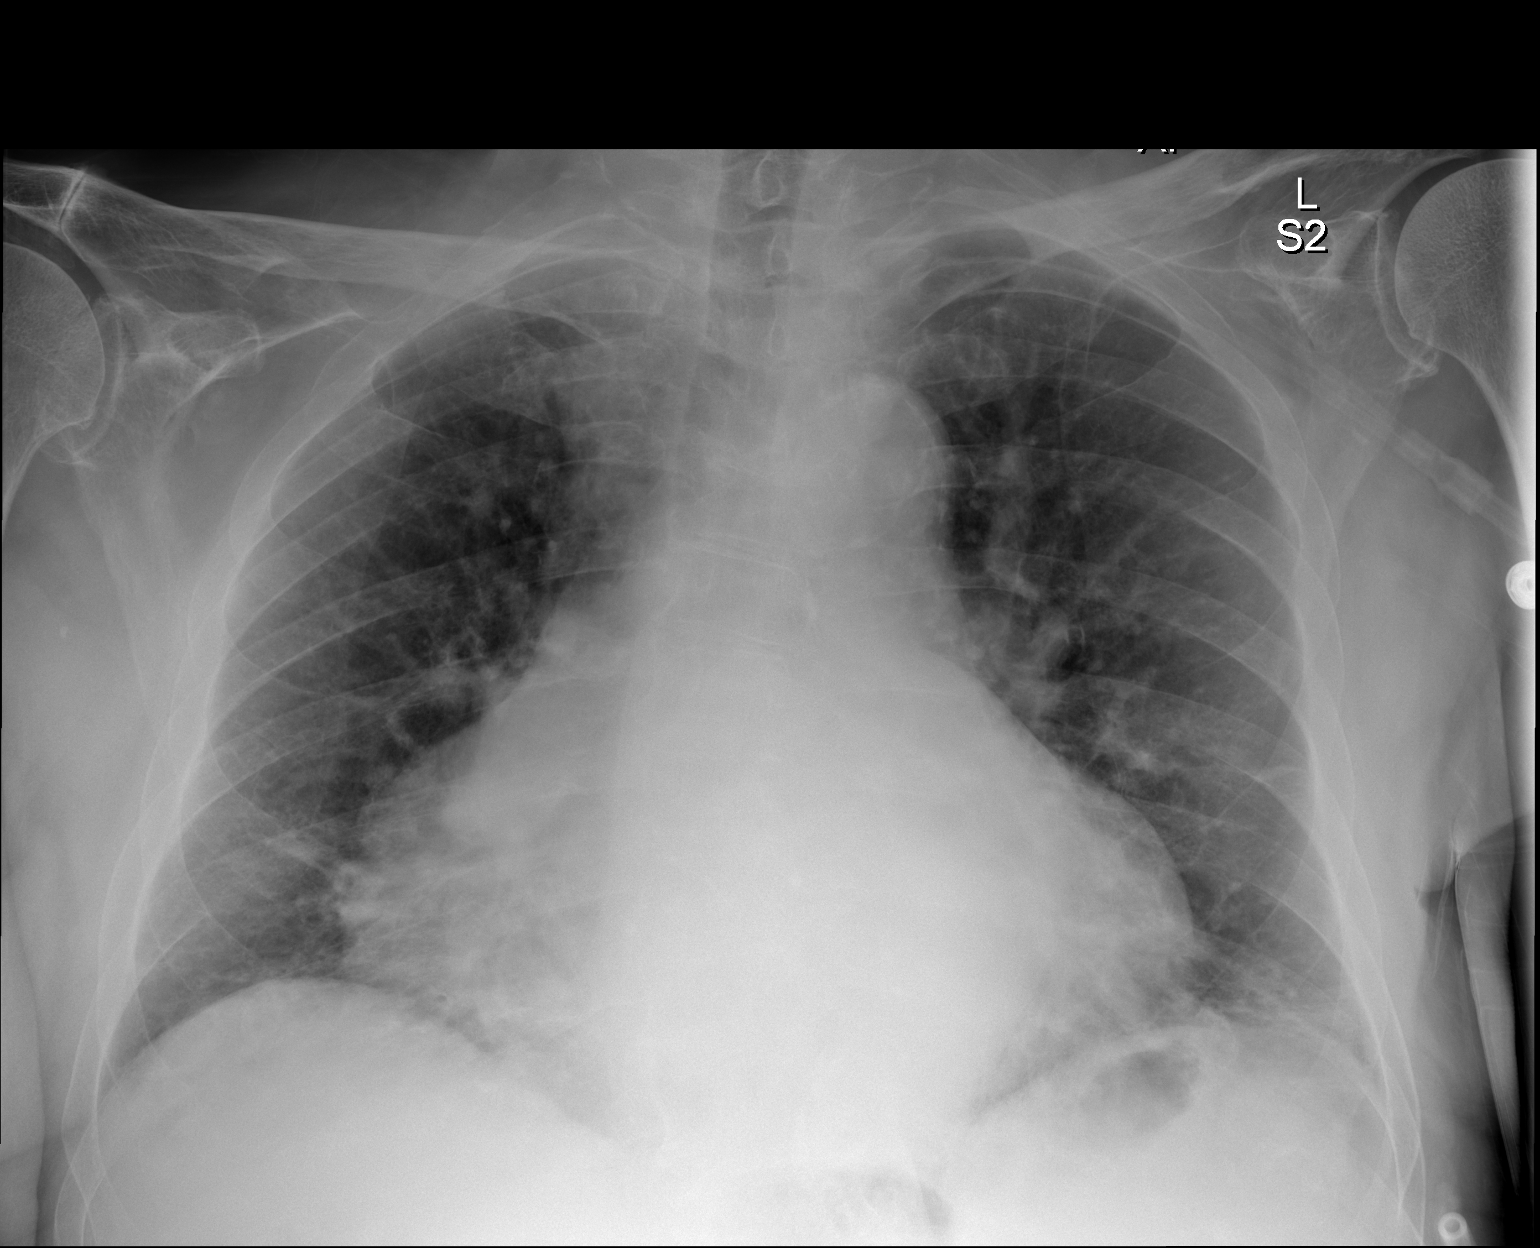

[2 of 2 positions shown; findings below may reference images not displayed]

FINDINGS: Cardiac shadow is enlarged. The lungs are well aerated bilaterally.
Mild vascular congestion is noted. No focal infiltrate is seen.
IMPRESSION: Mild vascular congestion.
